# Patient Record
Sex: Female | Born: 1965 | Race: White | Hispanic: No | Smoking: Current every day smoker
Health system: Southern US, Community
[De-identification: ages and names within clinical notes are randomized; demographics above are authoritative.]

---

## 2004-12-14 ENCOUNTER — Ambulatory Visit: Payer: Self-pay | Admitting: Pain Medicine

## 2004-12-25 ENCOUNTER — Ambulatory Visit: Payer: Self-pay | Admitting: Unknown Physician Specialty

## 2004-12-29 ENCOUNTER — Ambulatory Visit: Payer: Self-pay | Admitting: Unknown Physician Specialty

## 2004-12-31 ENCOUNTER — Ambulatory Visit: Payer: Self-pay | Admitting: Pain Medicine

## 2005-01-13 ENCOUNTER — Ambulatory Visit: Payer: Self-pay | Admitting: Physician Assistant

## 2005-01-18 ENCOUNTER — Ambulatory Visit: Payer: Self-pay | Admitting: Surgery

## 2005-01-26 ENCOUNTER — Ambulatory Visit: Payer: Self-pay | Admitting: Pain Medicine

## 2005-02-09 ENCOUNTER — Ambulatory Visit: Payer: Self-pay | Admitting: Pain Medicine

## 2005-02-26 ENCOUNTER — Ambulatory Visit: Payer: Self-pay | Admitting: Pain Medicine

## 2005-03-09 ENCOUNTER — Ambulatory Visit: Payer: Self-pay | Admitting: Unknown Physician Specialty

## 2005-03-16 ENCOUNTER — Ambulatory Visit: Payer: Self-pay | Admitting: Pain Medicine

## 2005-03-19 ENCOUNTER — Ambulatory Visit: Payer: Self-pay | Admitting: Unknown Physician Specialty

## 2005-04-15 ENCOUNTER — Ambulatory Visit: Payer: Self-pay | Admitting: Physician Assistant

## 2005-05-11 ENCOUNTER — Ambulatory Visit: Payer: Self-pay | Admitting: Pain Medicine

## 2005-06-10 ENCOUNTER — Ambulatory Visit: Payer: Self-pay | Admitting: Physician Assistant

## 2005-07-29 ENCOUNTER — Emergency Department: Payer: Self-pay | Admitting: Emergency Medicine

## 2005-11-09 ENCOUNTER — Ambulatory Visit: Payer: Self-pay | Admitting: Gastroenterology

## 2006-05-02 ENCOUNTER — Ambulatory Visit: Payer: Self-pay | Admitting: Pain Medicine

## 2006-05-17 ENCOUNTER — Ambulatory Visit: Payer: Self-pay | Admitting: Pain Medicine

## 2006-05-30 ENCOUNTER — Ambulatory Visit: Payer: Self-pay | Admitting: Physician Assistant

## 2006-06-16 ENCOUNTER — Ambulatory Visit: Payer: Self-pay | Admitting: Physician Assistant

## 2006-07-14 ENCOUNTER — Ambulatory Visit: Payer: Self-pay | Admitting: Physician Assistant

## 2006-08-12 ENCOUNTER — Ambulatory Visit: Payer: Self-pay | Admitting: Physician Assistant

## 2006-11-03 ENCOUNTER — Ambulatory Visit: Payer: Self-pay | Admitting: Physician Assistant

## 2006-11-10 ENCOUNTER — Ambulatory Visit: Payer: Self-pay | Admitting: Pain Medicine

## 2006-11-22 ENCOUNTER — Ambulatory Visit: Payer: Self-pay | Admitting: Physician Assistant

## 2006-11-30 ENCOUNTER — Ambulatory Visit: Payer: Self-pay | Admitting: Physician Assistant

## 2006-12-26 ENCOUNTER — Other Ambulatory Visit: Payer: Self-pay

## 2006-12-26 ENCOUNTER — Emergency Department: Payer: Self-pay | Admitting: Emergency Medicine

## 2006-12-28 ENCOUNTER — Ambulatory Visit: Payer: Self-pay | Admitting: Physician Assistant

## 2007-02-14 ENCOUNTER — Ambulatory Visit: Payer: Self-pay | Admitting: Physician Assistant

## 2007-03-01 ENCOUNTER — Ambulatory Visit: Payer: Self-pay | Admitting: Pain Medicine

## 2007-03-02 ENCOUNTER — Ambulatory Visit: Payer: Self-pay | Admitting: Pain Medicine

## 2007-03-14 ENCOUNTER — Ambulatory Visit: Payer: Self-pay | Admitting: Physician Assistant

## 2007-05-10 ENCOUNTER — Ambulatory Visit: Payer: Self-pay | Admitting: Physician Assistant

## 2007-06-05 ENCOUNTER — Ambulatory Visit: Payer: Self-pay | Admitting: Physician Assistant

## 2007-06-16 ENCOUNTER — Ambulatory Visit: Payer: Self-pay | Admitting: Physician Assistant

## 2007-06-26 ENCOUNTER — Ambulatory Visit: Payer: Self-pay | Admitting: Physician Assistant

## 2007-07-06 ENCOUNTER — Ambulatory Visit: Payer: Self-pay | Admitting: Physician Assistant

## 2007-07-18 ENCOUNTER — Ambulatory Visit: Payer: Self-pay | Admitting: Pain Medicine

## 2007-08-09 ENCOUNTER — Ambulatory Visit: Payer: Self-pay | Admitting: Physician Assistant

## 2007-09-20 ENCOUNTER — Ambulatory Visit: Payer: Self-pay | Admitting: Physician Assistant

## 2007-10-09 ENCOUNTER — Ambulatory Visit: Payer: Self-pay | Admitting: Physician Assistant

## 2007-10-12 ENCOUNTER — Encounter: Payer: Self-pay | Admitting: Pain Medicine

## 2007-10-28 ENCOUNTER — Encounter: Payer: Self-pay | Admitting: Pain Medicine

## 2007-11-28 ENCOUNTER — Encounter: Payer: Self-pay | Admitting: Pain Medicine

## 2007-12-06 ENCOUNTER — Ambulatory Visit: Payer: Self-pay | Admitting: Physician Assistant

## 2007-12-28 ENCOUNTER — Ambulatory Visit: Payer: Self-pay | Admitting: Internal Medicine

## 2008-01-08 ENCOUNTER — Ambulatory Visit: Payer: Self-pay | Admitting: Physician Assistant

## 2008-01-28 ENCOUNTER — Ambulatory Visit: Payer: Self-pay | Admitting: Internal Medicine

## 2008-02-02 ENCOUNTER — Ambulatory Visit: Payer: Self-pay | Admitting: Unknown Physician Specialty

## 2008-02-14 ENCOUNTER — Ambulatory Visit: Payer: Self-pay | Admitting: Internal Medicine

## 2008-02-27 ENCOUNTER — Ambulatory Visit: Payer: Self-pay | Admitting: Internal Medicine

## 2008-03-07 ENCOUNTER — Ambulatory Visit: Payer: Self-pay | Admitting: Physician Assistant

## 2008-03-29 ENCOUNTER — Ambulatory Visit: Payer: Self-pay | Admitting: Internal Medicine

## 2008-04-25 ENCOUNTER — Ambulatory Visit: Payer: Self-pay | Admitting: Physician Assistant

## 2008-04-29 ENCOUNTER — Ambulatory Visit: Payer: Self-pay | Admitting: Internal Medicine

## 2008-05-16 ENCOUNTER — Ambulatory Visit: Payer: Self-pay | Admitting: Internal Medicine

## 2008-05-21 ENCOUNTER — Ambulatory Visit: Payer: Self-pay | Admitting: Pain Medicine

## 2008-05-27 ENCOUNTER — Ambulatory Visit: Payer: Self-pay | Admitting: Internal Medicine

## 2008-05-28 ENCOUNTER — Ambulatory Visit: Payer: Self-pay | Admitting: Pain Medicine

## 2008-06-17 ENCOUNTER — Ambulatory Visit: Payer: Self-pay | Admitting: Pain Medicine

## 2008-06-27 ENCOUNTER — Ambulatory Visit: Payer: Self-pay | Admitting: Internal Medicine

## 2008-07-23 ENCOUNTER — Ambulatory Visit: Payer: Self-pay | Admitting: Physician Assistant

## 2008-07-27 ENCOUNTER — Ambulatory Visit: Payer: Self-pay | Admitting: Internal Medicine

## 2008-11-05 ENCOUNTER — Ambulatory Visit: Payer: Self-pay | Admitting: Physician Assistant

## 2009-01-14 ENCOUNTER — Emergency Department: Payer: Self-pay | Admitting: Emergency Medicine

## 2009-02-04 ENCOUNTER — Ambulatory Visit: Payer: Self-pay | Admitting: Physician Assistant

## 2009-04-22 ENCOUNTER — Ambulatory Visit: Payer: Self-pay | Admitting: Gastroenterology

## 2009-05-05 ENCOUNTER — Ambulatory Visit: Payer: Self-pay | Admitting: Pain Medicine

## 2009-05-27 ENCOUNTER — Ambulatory Visit: Payer: Self-pay | Admitting: Internal Medicine

## 2009-06-04 ENCOUNTER — Ambulatory Visit: Payer: Self-pay | Admitting: Internal Medicine

## 2009-06-27 ENCOUNTER — Ambulatory Visit: Payer: Self-pay | Admitting: Internal Medicine

## 2009-07-02 ENCOUNTER — Ambulatory Visit: Payer: Self-pay | Admitting: Pain Medicine

## 2009-07-27 ENCOUNTER — Ambulatory Visit: Payer: Self-pay | Admitting: Internal Medicine

## 2010-04-16 ENCOUNTER — Ambulatory Visit: Payer: Self-pay | Admitting: Allergy

## 2010-05-21 ENCOUNTER — Emergency Department: Payer: Self-pay | Admitting: Emergency Medicine

## 2011-02-22 ENCOUNTER — Ambulatory Visit: Payer: Self-pay | Admitting: Pain Medicine

## 2011-02-25 ENCOUNTER — Ambulatory Visit: Payer: Self-pay | Admitting: Pain Medicine

## 2011-03-08 ENCOUNTER — Ambulatory Visit: Payer: Self-pay | Admitting: Pain Medicine

## 2011-03-11 ENCOUNTER — Ambulatory Visit: Payer: Self-pay | Admitting: Pain Medicine

## 2011-04-05 ENCOUNTER — Ambulatory Visit: Payer: Self-pay | Admitting: Pain Medicine

## 2011-04-08 ENCOUNTER — Ambulatory Visit: Payer: Self-pay | Admitting: Pain Medicine

## 2011-04-26 ENCOUNTER — Ambulatory Visit: Payer: Self-pay | Admitting: Pain Medicine

## 2011-07-07 ENCOUNTER — Ambulatory Visit: Payer: Self-pay | Admitting: Pain Medicine

## 2011-07-21 ENCOUNTER — Ambulatory Visit: Payer: Self-pay | Admitting: Pain Medicine

## 2011-08-31 ENCOUNTER — Ambulatory Visit: Payer: Self-pay | Admitting: Pain Medicine

## 2011-12-27 ENCOUNTER — Ambulatory Visit: Payer: Self-pay | Admitting: Pain Medicine

## 2012-01-29 ENCOUNTER — Emergency Department: Payer: Self-pay | Admitting: Emergency Medicine

## 2012-03-05 LAB — CBC
HCT: 47.1 % — ABNORMAL HIGH (ref 35.0–47.0)
HGB: 15.7 g/dL (ref 12.0–16.0)
MCV: 86 fL (ref 80–100)
Platelet: 429 10*3/uL (ref 150–440)
RBC: 5.45 10*6/uL — ABNORMAL HIGH (ref 3.80–5.20)
RDW: 15.7 % — ABNORMAL HIGH (ref 11.5–14.5)
WBC: 15.1 10*3/uL — ABNORMAL HIGH (ref 3.6–11.0)

## 2012-03-05 LAB — COMPREHENSIVE METABOLIC PANEL
Albumin: 4.7 g/dL (ref 3.4–5.0)
Anion Gap: 13 (ref 7–16)
BUN: 14 mg/dL (ref 7–18)
Bilirubin,Total: 0.6 mg/dL (ref 0.2–1.0)
Calcium, Total: 9.3 mg/dL (ref 8.5–10.1)
Co2: 19 mmol/L — ABNORMAL LOW (ref 21–32)
Creatinine: 0.71 mg/dL (ref 0.60–1.30)
EGFR (African American): >60
EGFR (Non-African Amer.): >60
Osmolality: 278 (ref 275–301)
Potassium: 3.6 mmol/L (ref 3.5–5.1)
Sodium: 138 mmol/L (ref 136–145)

## 2012-03-05 LAB — URINALYSIS, COMPLETE
Bacteria: NONE SEEN
Bilirubin,UR: NEGATIVE
Ph: 5 (ref 4.5–8.0)
RBC,UR: 27 /HPF (ref 0–5)
Squamous Epithelial: 6

## 2012-03-05 LAB — LIPASE, BLOOD: Lipase: 69 U/L — ABNORMAL LOW (ref 73–393)

## 2012-03-05 LAB — PREGNANCY, URINE: Pregnancy Test, Urine: NEGATIVE m[IU]/mL

## 2012-03-06 ENCOUNTER — Inpatient Hospital Stay: Payer: Self-pay | Admitting: Surgery

## 2012-03-06 LAB — CBC WITH DIFFERENTIAL/PLATELET
Basophil %: 0.9 %
Eosinophil #: 0 10*3/uL (ref 0.0–0.7)
Eosinophil %: 0.1 %
Lymphocyte %: 7.8 %
MCH: 28.1 pg (ref 26.0–34.0)
MCHC: 32.4 g/dL (ref 32.0–36.0)
MCV: 87 fL (ref 80–100)
Monocyte #: 1.1 x10 3/mm — ABNORMAL HIGH (ref 0.2–0.9)
Platelet: 385 10*3/uL (ref 150–440)
RBC: 5.1 10*6/uL (ref 3.80–5.20)

## 2012-03-06 LAB — PROTIME-INR: INR: 1

## 2012-03-06 LAB — BASIC METABOLIC PANEL
Anion Gap: 10 (ref 7–16)
BUN: 11 mg/dL (ref 7–18)
Calcium, Total: 8.7 mg/dL (ref 8.5–10.1)
Chloride: 103 mmol/L (ref 98–107)
Co2: 24 mmol/L (ref 21–32)
Creatinine: 0.55 mg/dL — ABNORMAL LOW (ref 0.60–1.30)
EGFR (African American): >60
Osmolality: 277 (ref 275–301)
Potassium: 3.9 mmol/L (ref 3.5–5.1)

## 2012-03-07 LAB — BASIC METABOLIC PANEL
Anion Gap: 7 (ref 7–16)
BUN: 13 mg/dL (ref 7–18)
Chloride: 110 mmol/L — ABNORMAL HIGH (ref 98–107)
Creatinine: 0.98 mg/dL (ref 0.60–1.30)
EGFR (African American): >60
EGFR (Non-African Amer.): >60
Osmolality: 283 (ref 275–301)
Potassium: 4.5 mmol/L (ref 3.5–5.1)
Sodium: 140 mmol/L (ref 136–145)

## 2012-03-07 LAB — CBC WITH DIFFERENTIAL/PLATELET
Basophil #: 0 10*3/uL (ref 0.0–0.1)
Eosinophil %: 0.4 %
HCT: 46 % (ref 35.0–47.0)
HGB: 15.6 g/dL (ref 12.0–16.0)
Lymphocyte #: 0.6 10*3/uL — ABNORMAL LOW (ref 1.0–3.6)
Lymphocyte %: 7.8 %
MCH: 29.8 pg (ref 26.0–34.0)
MCHC: 33.8 g/dL (ref 32.0–36.0)
Monocyte #: 0.7 x10 3/mm (ref 0.2–0.9)
Neutrophil #: 6.7 10*3/uL — ABNORMAL HIGH (ref 1.4–6.5)
Neutrophil %: 83.5 %
RDW: 15.6 % — ABNORMAL HIGH (ref 11.5–14.5)

## 2012-03-11 LAB — CBC WITH DIFFERENTIAL/PLATELET
Basophil #: 0 10*3/uL (ref 0.0–0.1)
Eosinophil %: 0.9 %
HCT: 33 % — ABNORMAL LOW (ref 35.0–47.0)
HGB: 11.2 g/dL — ABNORMAL LOW (ref 12.0–16.0)
Lymphocyte %: 17.5 %
Monocyte %: 13.4 %
Neutrophil #: 4.3 10*3/uL (ref 1.4–6.5)
Neutrophil %: 67.8 %
Platelet: 201 10*3/uL (ref 150–440)
RDW: 15.9 % — ABNORMAL HIGH (ref 11.5–14.5)
WBC: 6.4 10*3/uL (ref 3.6–11.0)

## 2012-03-11 LAB — BASIC METABOLIC PANEL
Calcium, Total: 8.1 mg/dL — ABNORMAL LOW (ref 8.5–10.1)
Chloride: 106 mmol/L (ref 98–107)
Co2: 25 mmol/L (ref 21–32)
Creatinine: 0.53 mg/dL — ABNORMAL LOW (ref 0.60–1.30)
EGFR (Non-African Amer.): >60
Osmolality: 272 (ref 275–301)
Potassium: 3.6 mmol/L (ref 3.5–5.1)
Sodium: 138 mmol/L (ref 136–145)

## 2012-03-13 LAB — CLOSTRIDIUM DIFFICILE BY PCR

## 2012-03-15 LAB — CBC WITH DIFFERENTIAL/PLATELET
Basophil %: 0.1 %
Eosinophil #: 0.1 10*3/uL (ref 0.0–0.7)
Eosinophil %: 0.3 %
HGB: 11.1 g/dL — ABNORMAL LOW (ref 12.0–16.0)
Lymphocyte %: 8.2 %
MCH: 29.9 pg (ref 26.0–34.0)
Neutrophil #: 16.1 10*3/uL — ABNORMAL HIGH (ref 1.4–6.5)
Neutrophil %: 83.9 %
Platelet: 318 10*3/uL (ref 150–440)
RBC: 3.71 10*6/uL — ABNORMAL LOW (ref 3.80–5.20)
WBC: 19.2 10*3/uL — ABNORMAL HIGH (ref 3.6–11.0)

## 2012-03-15 LAB — CREATININE, SERUM
Creatinine: 1.83 mg/dL — ABNORMAL HIGH (ref 0.60–1.30)
EGFR (African American): 38 — ABNORMAL LOW
EGFR (Non-African Amer.): 33 — ABNORMAL LOW

## 2012-05-15 ENCOUNTER — Ambulatory Visit: Payer: Self-pay | Admitting: Surgery

## 2014-07-16 NOTE — H&P (Signed)
Subjective/Chief Complaint abd pain    History of Present Illness 48 hrs worsening abd pain. Mult prior episodes resolved after 24 hrs without hospitalization no f/c min flatus, nml BM earlier today single emesis this am nauseated    Past History IBS, anxiety depression. tob abuse, sleep apnea has CPAP but doesn't use it PSH bladder, gyn with bowel injury, peritonitis emergency bowel resection    Past Medical Health Smoking   Past Med/Surgical Hx:  Sleep Apnea  with CPAP:   Anxiety:   Bulging disc in neck:   GERD - Esophageal Reflux:   Arthritis:   Anemia:   Hypercholesterolemia:   fibroid ablation:   s/p EGD: noted reflux dz and barrets esophagus, but no change from prior exam  Cholecystectomy:   ALLERGIES:  Penicillin: Hives, Rash  Cipro: Itching  HOME MEDICATIONS: Medication Instructions Status  meloxicam 15 mg oral tablet 1 tab(s) orally once a day Active  Norco 325 mg-5 mg tablet 1 tab(s) orally 1 to 4 times a day, As Needed Active  Nexium delayed release capsule 40 mg 1 cap(s) orally 2 times a day x 30 days  Active  zonisamide 100 mg oral capsule 2 cap(s) orally once a day Active  amitriptyline 25 mg oral tablet 1 tab(s) orally once a day (at bedtime) Active  atorvastatin 40 mg oral tablet 1 tab(s) orally once a day (at bedtime) Active  clonazepam 2 mg oral tablet 1 tab(s) orally 4 times a day Active  lamotrigine 200 mg oral tablet, extended release 1 tab(s) orally once a day Active  Abilify 10 mg oral tablet 1 tab(s) orally once a day Active   Family and Social History:   Family History Non-Contributory    Social History positive  tobacco, negative ETOH, 2 ppd    + Tobacco Current (within 1 year)    Place of Living Home   Review of Systems:   Fever/Chills No    Cough No    Abdominal Pain Yes    Diarrhea No    Constipation No    Nausea/Vomiting Yes    SOB/DOE No    Chest Pain No    Dysuria No    Tolerating Diet No  Nauseated  Vomiting    Physical Exam:   GEN uncomfortable    HEENT pink conjunctivae    NECK supple    RESP normal resp effort  clear BS  no use of accessory muscles    CARD regular rate    ABD denies tenderness  soft  distended  nontender, no guarding no rebound. tympanitic    LYMPH negative neck    EXTR negative edema    SKIN normal to palpation    PSYCH alert, A+O to time, place, person, good insight   Lab Results: Hepatic:  08-Dec-13 18:34    Bilirubin, Total 0.6   Alkaline Phosphatase  146   SGPT (ALT) 20   SGOT (AST) 19   Total Protein, Serum 8.1   Albumin, Serum 4.7  Routine Chem:  08-Dec-13 18:34    Glucose, Serum  131   BUN 14   Creatinine (comp) 0.71   Sodium, Serum 138   Potassium, Serum 3.6   Chloride, Serum 106   CO2, Serum  19   Calcium (Total), Serum 9.3   Osmolality (calc) 278   eGFR (African American) >60   eGFR (Non-African American) >60 (eGFR values <29m/min/1.73 m2 may be an indication of chronic kidney disease (CKD). Calculated eGFR is useful in patients with  stable renal function. The eGFR calculation will not be reliable in acutely ill patients when serum creatinine is changing rapidly. It is not useful in  patients on dialysis. The eGFR calculation may not be applicable to patients at the low and high extremes of body sizes, pregnant women, and vegetarians.)   Anion Gap 13   Lipase  69 (Result(s) reported on 05 Mar 2012 at 07:00PM.)  Routine UA:  08-Dec-13 18:34    Color (UA) Amber   Clarity (UA) Cloudy   Glucose (UA) Negative   Bilirubin (UA) Negative   Ketones (UA) 2+   Specific Gravity (UA) 1.032   Blood (UA) 2+   pH (UA) 5.0   Protein (UA) >=500   Nitrite (UA) Negative   Leukocyte Esterase (UA) Negative (Result(s) reported on 05 Mar 2012 at 06:59PM.)   RBC (UA) 27 /HPF   WBC (UA) 3 /HPF   Bacteria (UA) NONE SEEN   Epithelial Cells (UA) 6 /HPF   Mucous (UA) PRESENT   Hyaline Cast (UA) 7 /LPF (Result(s) reported on 05 Mar 2012 at  06:59PM.)  Routine Sero:  08-Dec-13 06:34    Pregnancy Test, Urine NEGATIVE (The results of the qualitative urine HCG (Pregnancy Test) should be evaluated in light of other clinical information.  There are limitations to the test which, in certain clinical situations, may result in a false positive or negative result. Thehigh dose hook effect can occur in urine samples with extremely high HCG concentrations.  This effect can produce a negative result in certain situations. It is suggested that results of the qualitative HCG be confirmed by an alternate methodology, such as the quantitative serum beta HCG test.)  Routine Hem:  08-Dec-13 18:34    WBC (CBC)  15.1   RBC (CBC)  5.45   Hemoglobin (CBC) 15.7   Hematocrit (CBC)  47.1   Platelet Count (CBC) 429 (Result(s) reported on 05 Mar 2012 at 06:49PM.)   MCV 86   MCH 28.8   MCHC 33.3   RDW  15.7     Assessment/Admission Diagnosis CT rev'd pSBO, admit hydrate, NG tube   Electronic Signatures: Florene Glen (MD)  (Signed 09-Dec-13 00:23)  Authored: CHIEF COMPLAINT and HISTORY, PAST MEDICAL/SURGIAL HISTORY, ALLERGIES, HOME MEDICATIONS, FAMILY AND SOCIAL HISTORY, REVIEW OF SYSTEMS, PHYSICAL EXAM, LABS, ASSESSMENT AND PLAN   Last Updated: 09-Dec-13 00:23 by Florene Glen (MD)

## 2014-07-16 NOTE — H&P (Signed)
PATIENT NAME:  Brandi Leonard, Brandi Leonard MR#:  [id number] DATE OF BIRTH:  06-Mar-1966  DATE OF ADMISSION:  03/06/2012  CHIEF COMPLAINT: Abdominal pain.   HISTORY OF PRESENT ILLNESS: This is a patient with 48 hours of abdominal pain. She points to the middle of her abdomen. She states that it started about 48 hours ago and has been gradually worsening. She's had multiple episodes of this in the past, most recently several weeks ago which she states resolved after 24 hours or so, always resolving without necessitating a trip to the Emergency Room. She has never been admitted for this. Has never had a nasogastric tube for it.   She denies fevers or chills, is nauseated, had a single emesis this morning. Has been passing gas, although minimal, and had a bowel movement before coming to the Emergency Room today. She denies melena or hematochezia.   PAST MEDICAL HISTORY:  1. Anxiety.  2. Depression.  3. Reflux disease.   PAST SURGICAL HISTORY: GYN surgery and bladder surgery which resulted in a small bowel injury necessitating emergency surgery and small bowel resection.   ALLERGIES: Cipro and penicillin.   MEDICATIONS: Multiple, see chart.   FAMILY HISTORY: Noncontributory.   SOCIAL HISTORY: The patient smokes 2 packs of cigarettes per day. She does not drink alcohol.   REVIEW OF SYSTEMS: 10 system review was performed and negative with the exception of that mentioned in the history of present illness.   ADDITIONAL HISTORY: She has sleep apnea but does not utilize her CPAP machine which she has at home and rarely if ever uses it.   PHYSICAL EXAMINATION:   GENERAL: Healthy, uncomfortable-appearing Caucasian female patient, 62 inches tall, 120 pounds, BMI 22.   VITAL SIGNS: She is afebrile with a temperature of 96, pulse 93, respirations 18, blood pressure 172/96, 96% room air sat.   HEENT: No scleral icterus.   NECK: No palpable neck nodes.   CHEST: Clear to auscultation.   CARDIAC: Regular  rate and rhythm.   ABDOMEN: Soft distended, slightly tympanitic. Long midline scar is noted from the periumbilical area down to the pubis. No hernias are noted. There is essentially no tenderness. No percussion tenderness. No rebound tenderness. No guarding or rebound.   EXTREMITIES: Without edema. Calves are nontender.   NEUROLOGIC: Grossly intact.   INTEGUMENTARY: No jaundice.   LABORATORY, DIAGNOSTIC, AND RADIOLOGICAL DATA: CT scan is personally reviewed. Obvious bowel obstruction present with dilated small bowel and stomach. No free air. There are air-fluid levels.   Urinalysis shows 2+ blood, 27 RBCs per high-power field with negative leukocyte esterase. Lipase 69. Electrolytes show a CO2 of 19 and a glucose of 131, otherwise normal labs. White blood cell count 15.1, hemoglobin and hematocrit 15.7 and 47.1, platelet count of 429. Pregnancy test is negative.   ASSESSMENT AND PLAN: This patient has had multiple episodes of abdominal pain with nausea and vomiting suggestive of a bowel obstruction which have resolved over 24 hours. She has never required hospitalization for it. This time, however, it has lasted 48 hours. She sought medical care because it did not resolve but it is nearly identical to her prior episodes. She is passing gas and did have a bowel movement today prior to coming to the Emergency Room. Has only had a single emesis but her CT scan and physical exam suggests partial small bowel obstruction. I have recommended admission to the hospital, hydration, and a nasogastric tube for decompression of the dilated stomach and small bowel, re-examination with physical  exam and serial KUBs will be ordered. This was discussed with she and her husband. The rationale for this has been discussed. They understood and agreed to proceed. I also discussed this with the Emergency Room physician.   ____________________________ Jerrol Banana. Burt Knack, MD rec:drc D: 03/06/2012 00:29:12 ET T: 03/06/2012  07:23:39 ET JOB#: [id number]  cc: Jerrol Banana. Burt Knack, MD, <Dictator> Florene Glen MD ELECTRONICALLY SIGNED 03/07/2012 8:40

## 2014-07-16 NOTE — Op Note (Signed)
PATIENT NAME:  Brandi Leonard, Brandi Leonard MR#:  [id number] DATE OF BIRTH:  Aug 15, 1965  DATE OF PROCEDURE:  03/06/2012  PREOPERATIVE DIAGNOSIS: Adhesive small bowel obstruction.   POSTOPERATIVE DIAGNOSIS: Adhesive small bowel obstruction with threatened bowel, two strictures, and small bowel volvulus.   PROCEDURES PERFORMED:  1. Exploratory laparotomy.  2. Enterolysis.  3. Iliectomy x 2 with enteroenterostomies.  4. Incidental appendectomy.   SURGEON: Consuela Mimes, MD   ANESTHESIA: Adhesive small bowel obstruction.   PROCEDURE IN DETAIL: The patient was placed supine on the Operating Room table and prepped and draped in the usual sterile fashion. An incision was made to the previous midline incision but not carried all the way to the pubis as the scar went. Rather, the incision was extended up the midline toward the xiphoid process slightly. This was carried down through the subcutaneous tissue and the linea alba with electrocautery, and the peritoneum was entered carefully. There were adhesions to the omentum and transverse mesocolon to the anterior abdominal wall and these were taken down with the electrocautery carefully and then the transverse colon was delivered, and the patient had marked dilation and gas within the transverse colon as well as within the cecum and sigmoid colon. There were significant adhesions from the sigmoid colon to the left lateral sidewall, and these were taken down with the electrocautery so that the sigmoid colon and rectosigmoid could be straighter. There was an obvious small bowel obstruction with very dilated loops of small intestine, and these all seemed to be concentrated towards an adhesion that was in a stellate formation overlapping two loops at least of small intestine going between the transverse colon, omentum and retroperitoneum in the right midabdomen just above the true pelvis. First this adhesive band was taken down, and then there were multiple adhesions  within this stellate formation that were taken down with the electrocautery. It seemed that the patient reperitonealized areas of adhesions almost making some of her small bowel retroperitoneal. Once all of these were taken down and it was apparent that the very firm thick and tight adhesive band that I had divided had gone across two loops of small intestine and that there were other loops of intestine that were threatened by looking cyanotic prior to taking down these adhesions. Once the adhesions were taken down, the cyanosis completely resolved. Also, it appeared that the vast majority of the small intestine was volvulized around this adhesion but it was not a true closed loop. Once all of the adhesions were taken down, it was apparent that there were two very tight strictures that appeared quite chronic in nature. One was in the terminal ileum approximately 15 cm proximal to the ileocecal valve, and this was in the area of previous small bowel repairs as there were areas of silk suture on the antimesenteric surface of the small intestine just proximal to this. The other area of stricture was even tighter that was in the proximal or mid ileum, being quite a bit proximal to the terminal ileal stricture. Each of these strictures was then resected taking out a portion of the banded and strictured small intestinal mesentery as well utilizing the Harmonic scalpel on the mesentery and then a GIA stapling device on each portion of intestine. The separate specimens were handed off separately and the anastomoses were performed separately, but in each case an enteroenterostomy was performed on the antimesenteric surface of each with a firing of the GIA stapling device and then closure of the resultant enterotomy with  a TA-60 stapler. In each case, a 3-0 silk seromuscular Lembert suture was placed at the apex of the GIA staple line and the mesenteric defect was closed with a running 3-0 Vicryl. The appendicular artery was  ligated with the Harmonic scalpel and the mesoappendix was taken down with the electrocautery and the appendectomy was also performed with the GIA stapling device just at the base of the appendix where it met the cecum. This was performed because the appendix was extremely long and the patient had very redundant mesentery, and initially I was concerned as while I was taking down adhesions that the patient had Ladd bands, and I did in fact lyse many adhesions from the terminal ileum that went across the ascending colon and over to the duodenum (which was also distended). The patient had a normal ligament of Treitz, however, and therefore did not have a true nonrotation of the intestine. Despite this, the cecum was very mobile, however. Once all of the adhesions had been taken down and the two areas of enterectomy had been performed and the anastomoses had been performed, I replaced the small intestine in its anatomic position and then placed two small sheets of Seprafilm over the intestine and then draped the very redundant transverse colon over top of that with the small amount of omentum that was available. I then closed the linea alba with a running #1 PDS suture, irrigated the subcutaneous tissue, and closed the skin with a skin stapling device. A sterile dressing was applied completing the procedure. The patient tolerated the procedure well, and there were no complications.  ____________________________ Consuela Mimes, MD wfm:cbb D: 03/06/2012 17:29:18 ET T: 03/06/2012 17:48:10 ET JOB#: [id number] Consuela Mimes MD ELECTRONICALLY SIGNED 03/08/2012 7:56

## 2014-07-19 NOTE — Discharge Summary (Signed)
PATIENT NAME:  Brandi Leonard, PAE MR#:  [id number] DATE OF BIRTH:  05/13/1965  DATE OF ADMISSION:  03/06/2012 DATE OF DISCHARGE:  03/15/2012  PRINCIPLE DIAGNOSIS: Adhesive small bowel obstruction with two strictures and small bowel volvulus.   OTHER DIAGNOSES:  1. Anxiety/depression.  2. History of GYN surgery and bladder surgery which resulted in small bowel injury necessitating emergency surgery and small bowel resection.   PRINCIPLE PROCEDURE PERFORMED DURING THIS ADMISSION: On 03/06/2012, exploratory laparotomy, extensive enterolysis, ileectomy x 2 with enteroenterostomies and incidental appendectomy.   HOSPITAL COURSE: [name] was admitted to the hospital and had nasogastric suction and underwent the above-mentioned procedure for the above-mentioned diagnosis. Ultimately, she had her nasogastric tube removed and her diet was slowly advanced and she when was tolerating a diet she was discharged home. She was asked to make an appointment to see me in the office and to call in the interim for any problems.  ____________________________ Consuela Mimes, MD wfm:sb D: 03/24/2012 06:18:53 ET T: 03/24/2012 11:30:11 ET JOB#: [id number]  cc: Consuela Mimes, MD, <Dictator> Consuela Mimes MD ELECTRONICALLY SIGNED 04/02/2012 20:29

## 2016-09-06 DIAGNOSIS — D72829 Elevated white blood cell count, unspecified: Secondary | ICD-10-CM | POA: Diagnosis not present

## 2016-09-06 DIAGNOSIS — K565 Intestinal adhesions [bands], unspecified as to partial versus complete obstruction: Secondary | ICD-10-CM | POA: Diagnosis not present

## 2016-09-06 DIAGNOSIS — K529 Noninfective gastroenteritis and colitis, unspecified: Secondary | ICD-10-CM | POA: Diagnosis not present

## 2016-09-06 DIAGNOSIS — R1084 Generalized abdominal pain: Secondary | ICD-10-CM | POA: Diagnosis not present

## 2016-09-06 DIAGNOSIS — R109 Unspecified abdominal pain: Secondary | ICD-10-CM | POA: Diagnosis not present

## 2016-09-07 DIAGNOSIS — K565 Intestinal adhesions [bands], unspecified as to partial versus complete obstruction: Secondary | ICD-10-CM | POA: Diagnosis not present

## 2016-09-07 DIAGNOSIS — R109 Unspecified abdominal pain: Secondary | ICD-10-CM | POA: Diagnosis not present

## 2016-09-07 DIAGNOSIS — R1084 Generalized abdominal pain: Secondary | ICD-10-CM | POA: Diagnosis not present

## 2016-09-07 DIAGNOSIS — K529 Noninfective gastroenteritis and colitis, unspecified: Secondary | ICD-10-CM | POA: Diagnosis not present

## 2016-09-08 DIAGNOSIS — K631 Perforation of intestine (nontraumatic): Secondary | ICD-10-CM | POA: Diagnosis not present

## 2016-09-08 DIAGNOSIS — E2740 Unspecified adrenocortical insufficiency: Secondary | ICD-10-CM | POA: Diagnosis not present

## 2016-09-08 DIAGNOSIS — A419 Sepsis, unspecified organism: Secondary | ICD-10-CM

## 2016-09-08 DIAGNOSIS — J80 Acute respiratory distress syndrome: Secondary | ICD-10-CM | POA: Diagnosis not present

## 2016-09-09 DIAGNOSIS — A419 Sepsis, unspecified organism: Secondary | ICD-10-CM | POA: Diagnosis not present

## 2016-09-09 DIAGNOSIS — K631 Perforation of intestine (nontraumatic): Secondary | ICD-10-CM | POA: Diagnosis not present

## 2016-09-09 DIAGNOSIS — E2740 Unspecified adrenocortical insufficiency: Secondary | ICD-10-CM | POA: Diagnosis not present

## 2016-09-10 ENCOUNTER — Inpatient Hospital Stay (HOSPITAL_COMMUNITY): Payer: BLUE CROSS/BLUE SHIELD

## 2016-09-10 ENCOUNTER — Inpatient Hospital Stay (HOSPITAL_COMMUNITY)
Admission: AD | Admit: 2016-09-10 | Discharge: 2016-10-01 | DRG: 870 | Disposition: A | Discharge status: Home Health | Payer: BLUE CROSS/BLUE SHIELD | Source: Other Acute Inpatient Hospital | Attending: Internal Medicine | Admitting: Internal Medicine

## 2016-09-10 ENCOUNTER — Other Ambulatory Visit: Payer: Self-pay

## 2016-09-10 DIAGNOSIS — K56699 Other intestinal obstruction unspecified as to partial versus complete obstruction: Secondary | ICD-10-CM | POA: Diagnosis present

## 2016-09-10 DIAGNOSIS — R6520 Severe sepsis without septic shock: Secondary | ICD-10-CM | POA: Diagnosis present

## 2016-09-10 DIAGNOSIS — J690 Pneumonitis due to inhalation of food and vomit: Secondary | ICD-10-CM | POA: Diagnosis present

## 2016-09-10 DIAGNOSIS — R0682 Tachypnea, not elsewhere classified: Secondary | ICD-10-CM

## 2016-09-10 DIAGNOSIS — R188 Other ascites: Secondary | ICD-10-CM

## 2016-09-10 DIAGNOSIS — F419 Anxiety disorder, unspecified: Secondary | ICD-10-CM | POA: Diagnosis present

## 2016-09-10 DIAGNOSIS — I472 Ventricular tachycardia: Secondary | ICD-10-CM | POA: Diagnosis not present

## 2016-09-10 DIAGNOSIS — I5031 Acute diastolic (congestive) heart failure: Secondary | ICD-10-CM | POA: Diagnosis not present

## 2016-09-10 DIAGNOSIS — K9189 Other postprocedural complications and disorders of digestive system: Secondary | ICD-10-CM

## 2016-09-10 DIAGNOSIS — T4275XA Adverse effect of unspecified antiepileptic and sedative-hypnotic drugs, initial encounter: Secondary | ICD-10-CM | POA: Diagnosis present

## 2016-09-10 DIAGNOSIS — K659 Peritonitis, unspecified: Secondary | ICD-10-CM | POA: Diagnosis not present

## 2016-09-10 DIAGNOSIS — K631 Perforation of intestine (nontraumatic): Secondary | ICD-10-CM | POA: Diagnosis not present

## 2016-09-10 DIAGNOSIS — J441 Chronic obstructive pulmonary disease with (acute) exacerbation: Secondary | ICD-10-CM | POA: Diagnosis not present

## 2016-09-10 DIAGNOSIS — F17210 Nicotine dependence, cigarettes, uncomplicated: Secondary | ICD-10-CM | POA: Diagnosis not present

## 2016-09-10 DIAGNOSIS — K651 Peritoneal abscess: Secondary | ICD-10-CM | POA: Diagnosis not present

## 2016-09-10 DIAGNOSIS — I340 Nonrheumatic mitral (valve) insufficiency: Secondary | ICD-10-CM | POA: Diagnosis not present

## 2016-09-10 DIAGNOSIS — R140 Abdominal distension (gaseous): Secondary | ICD-10-CM | POA: Diagnosis not present

## 2016-09-10 DIAGNOSIS — Z978 Presence of other specified devices: Secondary | ICD-10-CM | POA: Diagnosis not present

## 2016-09-10 DIAGNOSIS — R509 Fever, unspecified: Secondary | ICD-10-CM | POA: Diagnosis not present

## 2016-09-10 DIAGNOSIS — R739 Hyperglycemia, unspecified: Secondary | ICD-10-CM | POA: Diagnosis not present

## 2016-09-10 DIAGNOSIS — D473 Essential (hemorrhagic) thrombocythemia: Secondary | ICD-10-CM | POA: Diagnosis not present

## 2016-09-10 DIAGNOSIS — E2740 Unspecified adrenocortical insufficiency: Secondary | ICD-10-CM | POA: Diagnosis present

## 2016-09-10 DIAGNOSIS — J9601 Acute respiratory failure with hypoxia: Secondary | ICD-10-CM | POA: Diagnosis not present

## 2016-09-10 DIAGNOSIS — G40409 Other generalized epilepsy and epileptic syndromes, not intractable, without status epilepticus: Secondary | ICD-10-CM | POA: Diagnosis not present

## 2016-09-10 DIAGNOSIS — R6521 Severe sepsis with septic shock: Secondary | ICD-10-CM | POA: Diagnosis present

## 2016-09-10 DIAGNOSIS — D6489 Other specified anemias: Secondary | ICD-10-CM | POA: Diagnosis present

## 2016-09-10 DIAGNOSIS — J95821 Acute postprocedural respiratory failure: Secondary | ICD-10-CM | POA: Diagnosis not present

## 2016-09-10 DIAGNOSIS — M549 Dorsalgia, unspecified: Secondary | ICD-10-CM | POA: Diagnosis present

## 2016-09-10 DIAGNOSIS — Z4659 Encounter for fitting and adjustment of other gastrointestinal appliance and device: Secondary | ICD-10-CM

## 2016-09-10 DIAGNOSIS — T17908A Unspecified foreign body in respiratory tract, part unspecified causing other injury, initial encounter: Secondary | ICD-10-CM

## 2016-09-10 DIAGNOSIS — Z681 Body mass index (BMI) 19 or less, adult: Secondary | ICD-10-CM | POA: Diagnosis not present

## 2016-09-10 DIAGNOSIS — T502X5A Adverse effect of carbonic-anhydrase inhibitors, benzothiadiazides and other diuretics, initial encounter: Secondary | ICD-10-CM | POA: Diagnosis not present

## 2016-09-10 DIAGNOSIS — E43 Unspecified severe protein-calorie malnutrition: Secondary | ICD-10-CM | POA: Diagnosis not present

## 2016-09-10 DIAGNOSIS — J449 Chronic obstructive pulmonary disease, unspecified: Secondary | ICD-10-CM | POA: Diagnosis present

## 2016-09-10 DIAGNOSIS — Z9889 Other specified postprocedural states: Secondary | ICD-10-CM | POA: Diagnosis not present

## 2016-09-10 DIAGNOSIS — J80 Acute respiratory distress syndrome: Secondary | ICD-10-CM | POA: Diagnosis present

## 2016-09-10 DIAGNOSIS — R7989 Other specified abnormal findings of blood chemistry: Secondary | ICD-10-CM

## 2016-09-10 DIAGNOSIS — R570 Cardiogenic shock: Secondary | ICD-10-CM | POA: Diagnosis not present

## 2016-09-10 DIAGNOSIS — R569 Unspecified convulsions: Secondary | ICD-10-CM | POA: Diagnosis not present

## 2016-09-10 DIAGNOSIS — K529 Noninfective gastroenteritis and colitis, unspecified: Secondary | ICD-10-CM | POA: Diagnosis present

## 2016-09-10 DIAGNOSIS — G9341 Metabolic encephalopathy: Secondary | ICD-10-CM | POA: Diagnosis present

## 2016-09-10 DIAGNOSIS — J431 Panlobular emphysema: Secondary | ICD-10-CM | POA: Diagnosis not present

## 2016-09-10 DIAGNOSIS — J810 Acute pulmonary edema: Secondary | ICD-10-CM | POA: Diagnosis not present

## 2016-09-10 DIAGNOSIS — E878 Other disorders of electrolyte and fluid balance, not elsewhere classified: Secondary | ICD-10-CM | POA: Diagnosis not present

## 2016-09-10 DIAGNOSIS — R109 Unspecified abdominal pain: Secondary | ICD-10-CM | POA: Diagnosis present

## 2016-09-10 DIAGNOSIS — E872 Acidosis: Secondary | ICD-10-CM | POA: Diagnosis present

## 2016-09-10 DIAGNOSIS — J9621 Acute and chronic respiratory failure with hypoxia: Secondary | ICD-10-CM | POA: Diagnosis not present

## 2016-09-10 DIAGNOSIS — J9600 Acute respiratory failure, unspecified whether with hypoxia or hypercapnia: Secondary | ICD-10-CM

## 2016-09-10 DIAGNOSIS — A419 Sepsis, unspecified organism: Secondary | ICD-10-CM | POA: Diagnosis not present

## 2016-09-10 DIAGNOSIS — R52 Pain, unspecified: Secondary | ICD-10-CM | POA: Diagnosis not present

## 2016-09-10 DIAGNOSIS — K56609 Unspecified intestinal obstruction, unspecified as to partial versus complete obstruction: Secondary | ICD-10-CM | POA: Diagnosis not present

## 2016-09-10 DIAGNOSIS — R197 Diarrhea, unspecified: Secondary | ICD-10-CM | POA: Diagnosis not present

## 2016-09-10 DIAGNOSIS — G9340 Encephalopathy, unspecified: Secondary | ICD-10-CM | POA: Diagnosis not present

## 2016-09-10 DIAGNOSIS — J189 Pneumonia, unspecified organism: Secondary | ICD-10-CM | POA: Diagnosis not present

## 2016-09-10 DIAGNOSIS — Z452 Encounter for adjustment and management of vascular access device: Secondary | ICD-10-CM

## 2016-09-10 DIAGNOSIS — E870 Hyperosmolality and hypernatremia: Secondary | ICD-10-CM | POA: Diagnosis not present

## 2016-09-10 DIAGNOSIS — K567 Ileus, unspecified: Secondary | ICD-10-CM

## 2016-09-10 DIAGNOSIS — F13239 Sedative, hypnotic or anxiolytic dependence with withdrawal, unspecified: Secondary | ICD-10-CM | POA: Diagnosis not present

## 2016-09-10 DIAGNOSIS — L0291 Cutaneous abscess, unspecified: Secondary | ICD-10-CM

## 2016-09-10 DIAGNOSIS — I248 Other forms of acute ischemic heart disease: Secondary | ICD-10-CM | POA: Diagnosis not present

## 2016-09-10 DIAGNOSIS — Z880 Allergy status to penicillin: Secondary | ICD-10-CM

## 2016-09-10 DIAGNOSIS — E876 Hypokalemia: Secondary | ICD-10-CM | POA: Diagnosis not present

## 2016-09-10 DIAGNOSIS — D72829 Elevated white blood cell count, unspecified: Secondary | ICD-10-CM | POA: Diagnosis not present

## 2016-09-10 DIAGNOSIS — B9620 Unspecified Escherichia coli [E. coli] as the cause of diseases classified elsewhere: Secondary | ICD-10-CM | POA: Diagnosis present

## 2016-09-10 DIAGNOSIS — K7200 Acute and subacute hepatic failure without coma: Secondary | ICD-10-CM | POA: Diagnosis not present

## 2016-09-10 DIAGNOSIS — B9689 Other specified bacterial agents as the cause of diseases classified elsewhere: Secondary | ICD-10-CM | POA: Diagnosis not present

## 2016-09-10 DIAGNOSIS — D75839 Thrombocytosis, unspecified: Secondary | ICD-10-CM | POA: Diagnosis not present

## 2016-09-10 DIAGNOSIS — R0603 Acute respiratory distress: Secondary | ICD-10-CM

## 2016-09-10 DIAGNOSIS — Z8719 Personal history of other diseases of the digestive system: Secondary | ICD-10-CM | POA: Diagnosis not present

## 2016-09-10 DIAGNOSIS — E785 Hyperlipidemia, unspecified: Secondary | ICD-10-CM | POA: Diagnosis present

## 2016-09-10 DIAGNOSIS — J9690 Respiratory failure, unspecified, unspecified whether with hypoxia or hypercapnia: Secondary | ICD-10-CM

## 2016-09-10 DIAGNOSIS — R451 Restlessness and agitation: Secondary | ICD-10-CM | POA: Diagnosis not present

## 2016-09-10 LAB — CBC WITH DIFFERENTIAL/PLATELET
BASOS PCT: 0 %
Basophils Absolute: 0 10*3/uL (ref 0.0–0.1)
EOS PCT: 0 %
Eosinophils Absolute: 0 10*3/uL (ref 0.0–0.7)
HEMATOCRIT: 22.5 % — AB (ref 36.0–46.0)
Hemoglobin: 6.9 g/dL — CL (ref 12.0–15.0)
Lymphocytes Relative: 4 %
Lymphs Abs: 0.8 10*3/uL (ref 0.7–4.0)
MCH: 25.1 pg — ABNORMAL LOW (ref 26.0–34.0)
MCHC: 30.7 g/dL (ref 30.0–36.0)
MCV: 81.8 fL (ref 78.0–100.0)
MONO ABS: 0.7 10*3/uL (ref 0.1–1.0)
MONOS PCT: 4 %
Neutro Abs: 16.4 10*3/uL — ABNORMAL HIGH (ref 1.7–7.7)
Neutrophils Relative %: 92 %
PLATELETS: 202 10*3/uL (ref 150–400)
RBC: 2.75 MIL/uL — ABNORMAL LOW (ref 3.87–5.11)
RDW: 18.3 % — AB (ref 11.5–15.5)
WBC: 17.8 10*3/uL — ABNORMAL HIGH (ref 4.0–10.5)

## 2016-09-10 LAB — BLOOD GAS, ARTERIAL
ACID-BASE DEFICIT: 1.5 mmol/L (ref 0.0–2.0)
BICARBONATE: 24.5 mmol/L (ref 20.0–28.0)
Drawn by: 44898
FIO2: 70
MECHVT: 410 mL
O2 Saturation: 92.2 %
PATIENT TEMPERATURE: 98.6
PCO2 ART: 55.6 mmHg — AB (ref 32.0–48.0)
PEEP/CPAP: 10 cmH2O
PH ART: 7.267 — AB (ref 7.350–7.450)
PO2 ART: 71.5 mmHg — AB (ref 83.0–108.0)
RATE: 30 resp/min

## 2016-09-10 LAB — URINALYSIS, ROUTINE W REFLEX MICROSCOPIC
BACTERIA UA: NONE SEEN
BILIRUBIN URINE: NEGATIVE
Glucose, UA: 150 mg/dL — AB
Ketones, ur: 5 mg/dL — AB
NITRITE: NEGATIVE
PH: 6 (ref 5.0–8.0)
Protein, ur: NEGATIVE mg/dL
SPECIFIC GRAVITY, URINE: 1.016 (ref 1.005–1.030)

## 2016-09-10 LAB — POCT I-STAT 3, ART BLOOD GAS (G3+)
ACID-BASE DEFICIT: 1 mmol/L (ref 0.0–2.0)
BICARBONATE: 25.3 mmol/L (ref 20.0–28.0)
O2 SAT: 96 %
PCO2 ART: 49.6 mmHg — AB (ref 32.0–48.0)
PO2 ART: 88 mmHg (ref 83.0–108.0)
Patient temperature: 97.8
TCO2: 27 mmol/L (ref 0–100)
pH, Arterial: 7.313 — ABNORMAL LOW (ref 7.350–7.450)

## 2016-09-10 LAB — GLUCOSE, CAPILLARY
Glucose-Capillary: 103 mg/dL — ABNORMAL HIGH (ref 65–99)
Glucose-Capillary: 110 mg/dL — ABNORMAL HIGH (ref 65–99)
Glucose-Capillary: 99 mg/dL (ref 65–99)

## 2016-09-10 LAB — COMPREHENSIVE METABOLIC PANEL
ALT: 41 U/L (ref 14–54)
ANION GAP: 3 — AB (ref 5–15)
AST: 58 U/L — ABNORMAL HIGH (ref 15–41)
Albumin: 1.5 g/dL — ABNORMAL LOW (ref 3.5–5.0)
Alkaline Phosphatase: 87 U/L (ref 38–126)
BUN: 10 mg/dL (ref 6–20)
CHLORIDE: 108 mmol/L (ref 101–111)
CO2: 21 mmol/L — ABNORMAL LOW (ref 22–32)
Calcium: 5.9 mg/dL — CL (ref 8.9–10.3)
Creatinine, Ser: 0.62 mg/dL (ref 0.44–1.00)
GFR calc Af Amer: >60 mL/min (ref >60)
GFR calc non Af Amer: >60 mL/min (ref >60)
Glucose, Bld: 400 mg/dL — ABNORMAL HIGH (ref 65–99)
POTASSIUM: 4.4 mmol/L (ref 3.5–5.1)
Sodium: 132 mmol/L — ABNORMAL LOW (ref 135–145)
TOTAL PROTEIN: 3.7 g/dL — AB (ref 6.5–8.1)

## 2016-09-10 LAB — MRSA PCR SCREENING: MRSA by PCR: NEGATIVE

## 2016-09-10 LAB — HEMOGLOBIN AND HEMATOCRIT, BLOOD
HEMATOCRIT: 30.5 % — AB (ref 36.0–46.0)
Hemoglobin: 9.4 g/dL — ABNORMAL LOW (ref 12.0–15.0)

## 2016-09-10 LAB — TROPONIN I
TROPONIN I: 1 ng/mL — AB (ref <0.03)
TROPONIN I: 0.41 ng/mL — AB (ref <0.03)

## 2016-09-10 LAB — PROTIME-INR
INR: 1.65
PROTHROMBIN TIME: 19.8 s — AB (ref 11.4–15.2)

## 2016-09-10 LAB — TRIGLYCERIDES: Triglycerides: 139 mg/dL (ref <150)

## 2016-09-10 LAB — PREPARE RBC (CROSSMATCH)

## 2016-09-10 LAB — MAGNESIUM: MAGNESIUM: 1.3 mg/dL — AB (ref 1.7–2.4)

## 2016-09-10 LAB — PROCALCITONIN: Procalcitonin: 13.61 ng/mL

## 2016-09-10 LAB — ABO/RH: ABO/RH(D): B POS

## 2016-09-10 LAB — PHOSPHORUS: Phosphorus: 1.9 mg/dL — ABNORMAL LOW (ref 2.5–4.6)

## 2016-09-10 MED ORDER — INSULIN ASPART 100 UNIT/ML ~~LOC~~ SOLN
1.0000 [IU] | SUBCUTANEOUS | Status: DC
  Administered 2016-09-11 – 2016-09-12 (×4): 1 [IU] via SUBCUTANEOUS
  Administered 2016-09-12: 2 [IU] via SUBCUTANEOUS
  Administered 2016-09-12: 1 [IU] via SUBCUTANEOUS
  Administered 2016-09-13 (×2): 2 [IU] via SUBCUTANEOUS

## 2016-09-10 MED ORDER — SODIUM CHLORIDE 0.9% FLUSH
10.0000 mL | Freq: Two times a day (BID) | INTRAVENOUS | Status: DC
  Administered 2016-09-10 – 2016-09-15 (×10): 10 mL
  Administered 2016-09-16: 20 mL
  Administered 2016-09-17 – 2016-09-23 (×14): 10 mL
  Administered 2016-09-24: 20 mL
  Administered 2016-09-24 – 2016-09-26 (×3): 10 mL
  Administered 2016-09-26: 20 mL
  Administered 2016-09-27 – 2016-09-28 (×3): 10 mL

## 2016-09-10 MED ORDER — SODIUM CHLORIDE 0.9 % IV SOLN
Freq: Once | INTRAVENOUS | Status: AC
  Administered 2016-09-10: 17:00:00 via INTRAVENOUS

## 2016-09-10 MED ORDER — MIDAZOLAM HCL 2 MG/2ML IJ SOLN
2.0000 mg | Freq: Once | INTRAMUSCULAR | Status: AC
  Administered 2016-09-10: 2 mg via INTRAVENOUS

## 2016-09-10 MED ORDER — FUROSEMIDE 10 MG/ML IJ SOLN
20.0000 mg | Freq: Once | INTRAMUSCULAR | Status: AC
  Administered 2016-09-10: 20 mg via INTRAVENOUS
  Filled 2016-09-10: qty 2

## 2016-09-10 MED ORDER — SODIUM CHLORIDE 0.9% FLUSH
10.0000 mL | INTRAVENOUS | Status: DC | PRN
  Administered 2016-09-17: 10 mL
  Administered 2016-09-26: 20 mL
  Administered 2016-09-26: 40 mL
  Filled 2016-09-10 (×3): qty 40

## 2016-09-10 MED ORDER — MIDAZOLAM HCL 2 MG/2ML IJ SOLN
INTRAMUSCULAR | Status: AC
  Filled 2016-09-10: qty 2

## 2016-09-10 MED ORDER — ORAL CARE MOUTH RINSE
15.0000 mL | OROMUCOSAL | Status: DC
  Administered 2016-09-10 – 2016-09-17 (×68): 15 mL via OROMUCOSAL

## 2016-09-10 MED ORDER — SODIUM CHLORIDE 0.9 % IV SOLN
1.0000 g | Freq: Once | INTRAVENOUS | Status: AC
  Administered 2016-09-10: 1 g via INTRAVENOUS
  Filled 2016-09-10: qty 10

## 2016-09-10 MED ORDER — FENTANYL 2500MCG IN NS 250ML (10MCG/ML) PREMIX INFUSION
25.0000 ug/h | INTRAVENOUS | Status: DC
  Administered 2016-09-10: 100 ug/h via INTRAVENOUS
  Administered 2016-09-11 (×3): 250 ug/h via INTRAVENOUS
  Administered 2016-09-12: 300 ug/h via INTRAVENOUS
  Administered 2016-09-12: 250 ug/h via INTRAVENOUS
  Administered 2016-09-13: 200 ug/h via INTRAVENOUS
  Administered 2016-09-13: 350 ug/h via INTRAVENOUS
  Administered 2016-09-14 – 2016-09-15 (×2): 100 ug/h via INTRAVENOUS
  Filled 2016-09-10 (×10): qty 250

## 2016-09-10 MED ORDER — PROPOFOL 1000 MG/100ML IV EMUL: 0.0000 ug/kg/min | INTRAVENOUS | Status: DC

## 2016-09-10 MED ORDER — PANTOPRAZOLE SODIUM 40 MG IV SOLR
40.0000 mg | Freq: Every day | INTRAVENOUS | Status: DC
  Administered 2016-09-10 – 2016-09-18 (×9): 40 mg via INTRAVENOUS
  Filled 2016-09-10 (×11): qty 40

## 2016-09-10 MED ORDER — ASPIRIN 300 MG RE SUPP
150.0000 mg | Freq: Every day | RECTAL | Status: DC
  Administered 2016-09-10 – 2016-09-17 (×8): 150 mg via RECTAL
  Filled 2016-09-10 (×8): qty 1

## 2016-09-10 MED ORDER — CHLORHEXIDINE GLUCONATE 0.12% ORAL RINSE (MEDLINE KIT)
15.0000 mL | Freq: Two times a day (BID) | OROMUCOSAL | Status: DC
  Administered 2016-09-10 – 2016-09-17 (×14): 15 mL via OROMUCOSAL

## 2016-09-10 MED ORDER — FENTANYL CITRATE (PF) 100 MCG/2ML IJ SOLN
50.0000 ug | Freq: Once | INTRAMUSCULAR | Status: AC
  Administered 2016-09-10: 50 ug via INTRAVENOUS

## 2016-09-10 MED ORDER — HYDROCORTISONE NA SUCCINATE PF 100 MG IJ SOLR
50.0000 mg | Freq: Four times a day (QID) | INTRAMUSCULAR | Status: DC
  Administered 2016-09-10 – 2016-09-11 (×4): 50 mg via INTRAVENOUS
  Filled 2016-09-10 (×4): qty 2

## 2016-09-10 MED ORDER — FENTANYL BOLUS VIA INFUSION
50.0000 ug | INTRAVENOUS | Status: DC | PRN
Start: 2016-09-10 — End: 2016-09-16
  Administered 2016-09-12 – 2016-09-14 (×5): 50 ug via INTRAVENOUS
  Filled 2016-09-10: qty 50

## 2016-09-10 MED ORDER — CHLORHEXIDINE GLUCONATE CLOTH 2 % EX PADS
6.0000 | MEDICATED_PAD | Freq: Every day | CUTANEOUS | Status: DC
  Administered 2016-09-10 – 2016-09-24 (×17): 6 via TOPICAL

## 2016-09-10 MED ORDER — MIDAZOLAM HCL 2 MG/2ML IJ SOLN
1.0000 mg | INTRAMUSCULAR | Status: DC | PRN
  Administered 2016-09-11: 1 mg via INTRAVENOUS
  Administered 2016-09-11 – 2016-09-12 (×4): 2 mg via INTRAVENOUS
  Filled 2016-09-10 (×4): qty 2

## 2016-09-10 MED ORDER — HEPARIN SODIUM (PORCINE) 5000 UNIT/ML IJ SOLN
5000.0000 [IU] | Freq: Three times a day (TID) | INTRAMUSCULAR | Status: DC
  Administered 2016-09-10 – 2016-09-21 (×33): 5000 [IU] via SUBCUTANEOUS
  Filled 2016-09-10 (×37): qty 1

## 2016-09-10 NOTE — Progress Notes (Signed)
eLink Physician-Brief Progress Note Patient Name: Brandi Leonard DOB: 1965/06/05 MRN: [id number]   Date of Service  09/10/2016  HPI/Events of Note  ABG on 70%/PRVC 35/P 12 = 7.31/49.6/88.0/27  eICU Interventions  Continue present ventilator management.      Intervention Category Major Interventions: Respiratory failure - evaluation and management  Keiasha Diep Eugene 09/10/2016, 8:01 PM

## 2016-09-10 NOTE — Progress Notes (Addendum)
eLink Physician-Brief Progress Note Patient Name: DAIANA VITIELLO DOB: [birth year] MRN: [id number]   Date of Service  09/10/2016  HPI/Events of Note  Troponin = 1.0. Demand ischemia?  eICU Interventions  Will order: 1. ASA Suppository 150 mg PR now and Q day. 2. 12 Lead EKG now.  3. Continue to trend Troponin.      Intervention Category Major Interventions: Electrolyte abnormality - evaluation and management Intermediate Interventions: Diagnostic test evaluation  Sommer,Steven Eugene 09/10/2016, 7:49 PM

## 2016-09-10 NOTE — Procedures (Signed)
Central Venous Catheter Insertion Procedure Note Brandi Leonard [id number] 10/12/1965  Procedure: Insertion of Central Venous Catheter Indications: Assessment of intravascular volume  Procedure Details Consent: Unable to obtain consent because of emergent medical necessity. Time Out: Verified patient identification, verified procedure, site/side was marked, verified correct patient position, special equipment/implants available, medications/allergies/relevent history reviewed, required imaging and test results available.  Performed  Maximum sterile technique was used including antiseptics, cap, gloves, gown, hand hygiene, mask and sheet. Skin prep: Chlorhexidine; local anesthetic administered A antimicrobial bonded/coated triple lumen catheter was placed in the right internal jugular vein using the Seldinger technique at 15 cm.  Line sutured.  Biopatch applied.  Evaluation Blood flow good Complications: No apparent complications Patient did tolerate procedure well. Chest X-ray ordered to verify placement.  CXR: pending.  Procedure performed with ultrasound guidance for real time vessel cannulation.      Kennieth Rad, AGACNP-BC [facility] Pgr: [phone] or if no answer [phone] 09/10/2016, 3:44 PM

## 2016-09-10 NOTE — H&P (Signed)
PULMONARY / CRITICAL CARE MEDICINE   Name: Brandi Leonard MRN: [id number] DOB: 04-18-1965    ADMISSION DATE:  09/10/2016 CONSULTATION DATE:  09/10/2016  REFERRING MD:  Dr. [facility]  CHIEF COMPLAINT:  shock  HISTORY OF PRESENT ILLNESS:   51 year old female with PMH significant for cholecystitis, SBO, anemia, COPD, and anxitey. She presented to [facility] ED6/11 with complaints of diffuse abdominal pain x 1 day. She developed nausea and vomiting prompting her to present to the emergency department. CT scan was done in the ED and demonstrated multiple dilated loops of fluid filled small bowel consistent with mechanical small bowel obstruction. She was initially managed conservatively, however, she suffered bowel perforation 6/12 and was taken emergently to the OR for repair. Also treated with antibiotics Primaxin, Levaquin, and flagyl. She was initially able to be extubated post op, but developed respiratory distress and required re-intubation. She developed diffuse pulmonary infiltrates concerning for ARDS vs Pulmonary Edema. She reportedly received 25L IVF over the course of her admission. She also developed shock treated with vasoactive infusions once IVF resuscitation had failed. Despite continued aggressive care, she remained unable to wean from the ventilator or come off pressors. 6/15 she was transferred to [facility] for further evaluation.   PAST MEDICAL HISTORY :  She  has no past medical history on file.  PAST SURGICAL HISTORY: She  has no past surgical history on file.  Allergies not on file  No current facility-administered medications on file prior to encounter.    No current outpatient prescriptions on file prior to encounter.    FAMILY HISTORY:  Her has no family status information on file.    SOCIAL HISTORY: She    REVIEW OF SYSTEMS:   unable  SUBJECTIVE:    VITAL SIGNS: BP 116/89   Pulse (!) 109   Temp 98.8 F (37.1 C) (Oral)   Resp 18   Ht  5\' 5"  (1.651 m)   Wt 71.7 kg (158 lb 1.1 oz)   SpO2 91%   BMI 26.30 kg/m   HEMODYNAMICS:    VENTILATOR SETTINGS: FiO2 (%):  [70 %] 70 %  INTAKE / OUTPUT: No intake/output data recorded.  PHYSICAL EXAMINATION: General:  Adult female on vent Neuro:  Sedated HEENT:  [place]/AT, PERRL, no appreciable JVD Cardiovascular:  RRR, no MRG. + edema to peripheries, very tight.  Lungs:  Coarse bilateral breath sounds Abdomen:  Midline longitudinal incision. Open abdomen.  Musculoskeletal:  No acute deformity Skin: Grossly intact with exception of surgical wound  LABS:  BMET No results for input(s): NA, K, CL, CO2, BUN, CREATININE, GLUCOSE in the last 168 hours.  Electrolytes No results for input(s): CALCIUM, MG, PHOS in the last 168 hours.  CBC No results for input(s): WBC, HGB, HCT, PLT in the last 168 hours.  Coag's No results for input(s): APTT, INR in the last 168 hours.  Sepsis Markers No results for input(s): LATICACIDVEN, PROCALCITON, O2SATVEN in the last 168 hours.  ABG No results for input(s): PHART, PCO2ART, PO2ART in the last 168 hours.  Liver Enzymes No results for input(s): AST, ALT, ALKPHOS, BILITOT, ALBUMIN in the last 168 hours.  Cardiac Enzymes No results for input(s): TROPONINI, PROBNP in the last 168 hours.  Glucose No results for input(s): GLUCAP in the last 168 hours.  Imaging No results found.   STUDIES:  CT abdomen 6/11 > multiple dilated loops of fluid filled small bowel consistent with mechanical small bowel obstruction  CULTURES: Blood 6/12 > Coag  neg staph 1/2 bottles  ANTIBIOTICS: Primaxin 6/12 > Levaquin 6/12 Flagyl 6/12 >  SIGNIFICANT EVENTS: 6/11 admit for SBO 6/12 perf to OR for repair 6/13 contnue shock, resp failure 6/15 transfer to [facility] for ICU admit.  LINES/TUBES: ETT 6/13 > R fem CVL 6/12 >6/15 Art line 6/15 > IJ CVL 6/15  DISCUSSION:   ASSESSMENT / PLAN:  PULMONARY A: Acute hypoxemic respiratory  failure ARDS Pulmonary edema: volume overload COPD without acute exacerbation  P:   Full vent support: PRVC 8cc/Kg IBW, rate 30, FiO2 90, PEEP 12 CXR ABG VAP bundle PRN albuterol  CARDIOVASCULAR A:  Shock: likely septic due to peritonitis. H/o HLD  P:  Telemetry monitoring Lock IVF Norepinephrine for MAP goal > 84mmHg Echo Troponin cycle  RENAL A:   Hypokalemia  Hypernatremia Hyperchloremia  P:   Saline lock IVF CMP now, mag, phos Strict I&O Hold diuretics now for shock  GASTROINTESTINAL A:   SBO complicated by perforation now s/p repair 6/12 Open abdomen  P:   NPO OGT to LIS Consult surgery, were planning to start TPN today at [place] abd per surgery  HEMATOLOGIC A:   Anemia  P:  CBC, coags, type and screen.  Subcutaneous heparin  INFECTIOUS A:   Septic shock secondary to peritonitis  P:   Continue ABX as above Pan culture PCT baseline  ENDOCRINE A:   Adrenal insufficiency (cortisol 12 at [place])   P:   Continue stress dose steroids CBG monitoring and SSI  NEUROLOGIC A:   Acute metabolic encephalopathy Occasional jerking movements  P:   RASS goal: -4 to -5 for vent synchrony  Fentanyl and propofol infusions Consider CT head/ EEG  FAMILY  - Updates: Mother and father updated 6/15   - Inter-disciplinary family meet or Palliative Care meeting due by:  6/20   Georgann Housekeeper, AGACNP-BC [facility] Pager [phone] or [phone]  09/10/2016 3:05 PM

## 2016-09-10 NOTE — Progress Notes (Signed)
eLink Physician-Brief Progress Note Patient Name: Brandi Leonard DOB: [birth year] MRN: [id number]   Date of Service  09/10/2016  HPI/Events of Note  Ca++ = 5.9 and Albumin = 1.5. Ca++ corrected for Albumin = 7.9.  eICU Interventions  Will replace Ca++.     Intervention Category Major Interventions: Electrolyte abnormality - evaluation and management  Sommer,Steven Eugene 09/10/2016, 7:46 PM

## 2016-09-10 NOTE — Procedures (Signed)
Arterial Catheter Insertion Procedure Note Brandi Leonard [id number] 1965/05/07  Procedure: Insertion of Arterial Catheter  Indications: Blood pressure monitoring and Frequent blood sampling  Procedure Details Consent: Unable to obtain consent because of altered level of consciousness. Time Out: Verified patient identification, verified procedure, site/side was marked, verified correct patient position, special equipment/implants available, medications/allergies/relevent history reviewed, required imaging and test results available.  Performed  Maximum sterile technique was used including antiseptics, cap, gloves, gown, hand hygiene, mask and sheet. Skin prep: Chlorhexidine 22 gauge catheter was inserted into left radial artery using the Seldinger technique.  Biopatch applied and sterile dressing placed.   Evaluation Blood flow good; BP tracing good. Complications: No apparent complications.  Procedure performed with ultrasound guidance for real time vessel cannulation.     Kennieth Rad, AGACNP-BC [place] [facility] Pgr: [phone] or if no answer [phone] 09/10/2016, 3:46 PM

## 2016-09-10 NOTE — Progress Notes (Addendum)
eLink Physician-Brief Progress Note Patient Name: Brandi Leonard DOB: Jun 23, 1965 MRN: [id number]   Date of Service  09/10/2016  HPI/Events of Note  ABG on 70%/PRVC 30/TV 410/P 10 = 7.27/55.6/71.5/24.5.  eICU Interventions  Will order: 1. Increase PRVC rate to 35 and PEEP to 12.  2. ABG at 6:30 PM.     Intervention Category Major Interventions: Acid-Base disturbance - evaluation and management;Respiratory failure - evaluation and management  Lysle Dingwall 09/10/2016, 5:34 PM

## 2016-09-11 ENCOUNTER — Inpatient Hospital Stay (HOSPITAL_COMMUNITY): Payer: BLUE CROSS/BLUE SHIELD

## 2016-09-11 DIAGNOSIS — I340 Nonrheumatic mitral (valve) insufficiency: Secondary | ICD-10-CM

## 2016-09-11 LAB — CBC
HEMATOCRIT: 27.7 % — AB (ref 36.0–46.0)
HEMOGLOBIN: 8.7 g/dL — AB (ref 12.0–15.0)
MCH: 25.2 pg — ABNORMAL LOW (ref 26.0–34.0)
MCHC: 31.4 g/dL (ref 30.0–36.0)
MCV: 80.3 fL (ref 78.0–100.0)
Platelets: 167 10*3/uL (ref 150–400)
RBC: 3.45 MIL/uL — AB (ref 3.87–5.11)
RDW: 17.2 % — ABNORMAL HIGH (ref 11.5–15.5)
WBC: 15.9 10*3/uL — ABNORMAL HIGH (ref 4.0–10.5)

## 2016-09-11 LAB — POCT I-STAT 3, ART BLOOD GAS (G3+)
ACID-BASE EXCESS: 4 mmol/L — AB (ref 0.0–2.0)
ACID-BASE EXCESS: 5 mmol/L — AB (ref 0.0–2.0)
BICARBONATE: 29.5 mmol/L — AB (ref 20.0–28.0)
Bicarbonate: 27.6 mmol/L (ref 20.0–28.0)
O2 SAT: 92 %
O2 Saturation: 96 %
PH ART: 7.445 (ref 7.350–7.450)
TCO2: 29 mmol/L (ref 0–100)
TCO2: 31 mmol/L (ref 0–100)
pCO2 arterial: 34.9 mmHg (ref 32.0–48.0)
pCO2 arterial: 42.9 mmHg (ref 32.0–48.0)
pH, Arterial: 7.506 — ABNORMAL HIGH (ref 7.350–7.450)
pO2, Arterial: 58 mmHg — ABNORMAL LOW (ref 83.0–108.0)
pO2, Arterial: 82 mmHg — ABNORMAL LOW (ref 83.0–108.0)

## 2016-09-11 LAB — HIV ANTIBODY (ROUTINE TESTING W REFLEX): HIV SCREEN 4TH GENERATION: NONREACTIVE

## 2016-09-11 LAB — URINE CULTURE: Culture: NO GROWTH

## 2016-09-11 LAB — BPAM RBC
Blood Product Expiration Date: 201806202359
ISSUE DATE / TIME: 201806151648
Unit Type and Rh: 1700

## 2016-09-11 LAB — BASIC METABOLIC PANEL
Anion gap: 6 (ref 5–15)
BUN: 15 mg/dL (ref 6–20)
CHLORIDE: 112 mmol/L — AB (ref 101–111)
CO2: 27 mmol/L (ref 22–32)
Calcium: 7.7 mg/dL — ABNORMAL LOW (ref 8.9–10.3)
Creatinine, Ser: 0.66 mg/dL (ref 0.44–1.00)
GFR calc non Af Amer: >60 mL/min (ref >60)
Glucose, Bld: 105 mg/dL — ABNORMAL HIGH (ref 65–99)
POTASSIUM: 4.2 mmol/L (ref 3.5–5.1)
SODIUM: 145 mmol/L (ref 135–145)

## 2016-09-11 LAB — GLUCOSE, CAPILLARY
GLUCOSE-CAPILLARY: 114 mg/dL — AB (ref 65–99)
GLUCOSE-CAPILLARY: 102 mg/dL — AB (ref 65–99)
GLUCOSE-CAPILLARY: 99 mg/dL (ref 65–99)
Glucose-Capillary: 92 mg/dL (ref 65–99)
Glucose-Capillary: 124 mg/dL — ABNORMAL HIGH (ref 65–99)

## 2016-09-11 LAB — TYPE AND SCREEN
ABO/RH(D): B POS
Antibody Screen: NEGATIVE
Unit division: 0

## 2016-09-11 LAB — ECHOCARDIOGRAM COMPLETE
Height: 65 in
WEIGHTICAEL: 2472.68 [oz_av]

## 2016-09-11 LAB — MAGNESIUM: MAGNESIUM: 1.4 mg/dL — AB (ref 1.7–2.4)

## 2016-09-11 LAB — TROPONIN I: Troponin I: 0.64 ng/mL (ref <0.03)

## 2016-09-11 LAB — PHOSPHORUS: PHOSPHORUS: 1.7 mg/dL — AB (ref 2.5–4.6)

## 2016-09-11 MED ORDER — M.V.I. ADULT IV INJ
INTRAVENOUS | Status: AC
  Administered 2016-09-11: 19:00:00 via INTRAVENOUS
  Filled 2016-09-11: qty 960

## 2016-09-11 MED ORDER — FUROSEMIDE 10 MG/ML IJ SOLN
40.0000 mg | Freq: Once | INTRAMUSCULAR | Status: AC
  Administered 2016-09-11: 40 mg via INTRAVENOUS
  Filled 2016-09-11: qty 4

## 2016-09-11 MED ORDER — SODIUM CHLORIDE 0.9 % IV SOLN
1.0000 g | Freq: Three times a day (TID) | INTRAVENOUS | Status: DC
  Administered 2016-09-11 – 2016-09-16 (×15): 1 g via INTRAVENOUS
  Filled 2016-09-11 (×16): qty 1

## 2016-09-11 MED ORDER — SODIUM PHOSPHATES 45 MMOLE/15ML IV SOLN
10.0000 mmol | Freq: Once | INTRAVENOUS | Status: AC
  Administered 2016-09-11: 10 mmol via INTRAVENOUS
  Filled 2016-09-11: qty 3.33

## 2016-09-11 MED ORDER — METRONIDAZOLE IN NACL 5-0.79 MG/ML-% IV SOLN
500.0000 mg | Freq: Three times a day (TID) | INTRAVENOUS | Status: DC
  Administered 2016-09-11 – 2016-09-16 (×15): 500 mg via INTRAVENOUS
  Filled 2016-09-11 (×15): qty 100

## 2016-09-11 MED ORDER — MAGNESIUM SULFATE 2 GM/50ML IV SOLN
2.0000 g | Freq: Once | INTRAVENOUS | Status: AC
  Administered 2016-09-11: 2 g via INTRAVENOUS
  Filled 2016-09-11: qty 50

## 2016-09-11 MED ORDER — POTASSIUM PHOSPHATES 15 MMOLE/5ML IV SOLN
10.0000 mmol | Freq: Once | INTRAVENOUS | Status: AC
  Administered 2016-09-11: 10 mmol via INTRAVENOUS
  Filled 2016-09-11: qty 3.33

## 2016-09-11 NOTE — Progress Notes (Signed)
eLink Physician-Brief Progress Note Patient Name: Brandi Leonard DOB: 03-19-1966 MRN: [id number]   Date of Service  09/11/2016  HPI/Events of Note  ABG on 50%/PRVC 30/TV 410/P 8 = 7.50/34.9/58  eICU Interventions  Will order: 1. Increase PEEP to 10. 2. Decrease PRVC rate to 24. 3. ABG at 5:30 PM.     Intervention Category Major Interventions: Acid-Base disturbance - evaluation and management;Hypoxemia - evaluation and management;Respiratory failure - evaluation and management  Lysle Dingwall 09/11/2016, 4:24 PM

## 2016-09-11 NOTE — Progress Notes (Signed)
  Echocardiogram 2D Echocardiogram has been performed.  Brandi Leonard 09/11/2016, 11:21 AM

## 2016-09-11 NOTE — Progress Notes (Signed)
   Subjective/Chief Complaint: Pt with no acute changes    Objective: Vital signs in last 24 hours: Temp:  [97.8 F (36.6 C)-98.8 F (37.1 C)] 98.5 F (36.9 C) (06/16 0324) Pulse Rate:  [72-109] 84 (06/16 0745) Resp:  [0-49] 8 (06/16 0745) BP: (86-140)/(61-104) 99/68 (06/16 0745) SpO2:  [90 %-100 %] 98 % (06/16 0745) Arterial Line BP: (71-133)/(36-75) 71/65 (06/16 0745) FiO2 (%):  [65 %-70 %] 65 % (06/16 0309) Weight:  [70.1 kg (154 lb 8.7 oz)-71.7 kg (158 lb 1.1 oz)] 70.1 kg (154 lb 8.7 oz) (06/16 0500) Last BM Date:  (PTA)  Intake/Output from previous day: 06/15 0701 - 06/16 0700 In: 724.4 [I.V.:249.4; Blood:335; NG/GT:30; IV Piggyback:110] Out: 2263 [Urine:1680; Emesis/NG output:150] Intake/Output this shift: No intake/output data recorded.  General appearance: sedated Cardio: regular rate and rhythm, S1, S2 normal, no murmur, click, rub or gallop GI: soft, distended, hypoactive BS, incision c/d/i  Lab Results:   Recent Labs  09/10/16 1344 09/10/16 2005 09/11/16 0347  WBC 17.8*  --  15.9*  HGB 6.9* 9.4* 8.7*  HCT 22.5* 30.5* 27.7*  PLT 202  --  167   BMET  Recent Labs  09/10/16 1344 09/11/16 0347  NA 132* 145  K 4.4 4.2  CL 108 112*  CO2 21* 27  GLUCOSE 400* 105*  BUN 10 15  CREATININE 0.62 0.66  CALCIUM 5.9* 7.7*   PT/INR  Recent Labs  09/10/16 1344  LABPROT 19.8*  INR 1.65   ABG  Recent Labs  09/10/16 1645 09/10/16 1835  PHART 7.267* 7.313*  HCO3 24.5 25.3    Studies/Results: Dg Chest Port 1 View  Result Date: 09/11/2016 CLINICAL DATA:  Lung infiltrates EXAM: PORTABLE CHEST 1 VIEW COMPARISON:  09/10/2016 FINDINGS: Endotracheal tube in satisfactory position. Right jugular central venous catheter tip in the proximal SVC unchanged Diffuse bilateral airspace disease unchanged. This is most prominent in the left lower lobe. Probable small pleural effusions. IMPRESSION: Support lines unchanged in satisfactory position Diffuse bilateral  airspace disease with basilar predominance is stable. Electronically Signed   By: Franchot Gallo M.D.   On: 09/11/2016 07:27   Dg Chest Port 1 View  Result Date: 09/10/2016 CLINICAL DATA:  Central line placement EXAM: PORTABLE CHEST 1 VIEW COMPARISON:  09/10/2016 FINDINGS: Right IJ central line catheter is noted without associated pneumothorax. The tip projects over the expected location of the proximal SVC. An endotracheal tube tip is seen 5.5 cm above the carina. A gastric tube extends below the left hemidiaphragm though the tip is excluded on this study. Heart is borderline enlarged with diffuse scattered ground-glass opacities which may reflect sequela of mild pulmonary edema. Retrocardiac opacity and consolidation persists with small left effusion. IMPRESSION: 1. Satisfactory appearing support line and tube positions. 2. Diffuse ground-glass opacities which may reflect sequela pulmonary edema or atypical pneumonia. Persistent retrocardiac consolidation and probable small left pleural effusion. Electronically Signed   By: Ashley Royalty M.D.   On: 09/10/2016 15:18    Anti-infectives: Anti-infectives    None      Assessment/Plan: POD 4 from exlap and SBR- Dr. Brantley Stage 09/07/2016 Con't NGT to LIWS, await bowel function Mobilize as tol Vent wean per CCM Following along  LOS: 1 day    Rosario Jacks., [facility] 09/11/2016

## 2016-09-11 NOTE — Progress Notes (Signed)
Pharmacy Antibiotic Note  Brandi Leonard is a 51 y.o. female admitted on 09/10/2016 with intra-abdominal infection.  Pharmacy has been consulted for meropenem dosing.  Also on flagyl.  Received Primaxin at [facility] - last dose 6/15.  Plan: 1. Meropenem 1g IV q 8 hrs. 2. F/u cultures, renal function and clinical course.  Height: 5\' 5"  (165.1 cm) Weight: 154 lb 8.7 oz (70.1 kg) IBW/kg (Calculated) : 57  Temp (24hrs), Avg:98.2 F (36.8 C), Min:97.6 F (36.4 C), Max:98.8 F (37.1 C)   Recent Labs Lab 09/10/16 1344 09/11/16 0347  WBC 17.8* 15.9*  CREATININE 0.62 0.66    Estimated Creatinine Clearance: 84.4 mL/min (by C-G formula based on SCr of 0.66 mg/dL).    Allergies  Allergen Reactions  . Penicillins Rash    Has patient had a PCN reaction causing immediate rash, facial/tongue/throat swelling, SOB or lightheadedness with hypotension: Unknown Has patient had a PCN reaction causing severe rash involving mucus membranes or skin necrosis: Unknown Has patient had a PCN reaction that required hospitalization: No Has patient had a PCN reaction occurring within the last 10 years: No If all of the above answers are "NO", then may proceed with Cephalosporin use.     Antimicrobials this admission:  Flagyl 6/11 >>  Meropenem 6/15 >>  Levaquin 6/11 > 6/14 @1530  Imipenem 6/12 > 6/15  Dose adjustments this admission:    Microbiology results:  6/15 BCx: ngtd 6/15 UCx: neg 6/15 Sputum: pending 6/15 MRSA PCR: neg  Wiley Cultures -6/13 Trach asp > moderate yeast -6/13 BCx x 2 -neg (F) -6/13 UCx - neg -6/14 BCx 1/2 w/ CNS   Thank you for allowing pharmacy to be a part of this patient's care.  Uvaldo Rising, BCPS  Clinical Pharmacist Pager [phone]  09/11/2016 1:03 PM

## 2016-09-11 NOTE — Plan of Care (Signed)
Problem: Activity: Goal: Ability to tolerate increased activity will improve Outcome: Not Progressing Pt remains intubated and sedated  Problem: Coping: Goal: Level of anxiety will decrease Outcome: Not Progressing Still requiring sedation  Problem: Nutritional: Goal: Intake of prescribed amount of daily calories will improve Outcome: Not Progressing MD to round  Problem: Respiratory: Goal: Ability to maintain a clear airway and adequate ventilation will improve Outcome: Progressing Weaning vent settings per RT  Problem: Role Relationship: Goal: Method of communication will improve Outcome: Not Progressing Intubated and sedated

## 2016-09-11 NOTE — Progress Notes (Signed)
PULMONARY / CRITICAL CARE MEDICINE   Name: Brandi Leonard MRN: [id number] DOB: 03/31/1965    ADMISSION DATE:  09/10/2016 CONSULTATION DATE:  09/10/2016  REFERRING MD:  Dr. [facility]  CHIEF COMPLAINT:  shock  HISTORY OF PRESENT ILLNESS:   51 year old admitted to [facility] 6/11 with Small bowel obstruction, underwent emergent laparotomy on 6/12 for perforation with peritonitis. Failed extubation postoperatively and then developed diffuse bilateral infiltrates consistent with ARDS and septic shock requiring Levophed  STUDIES:  CT abdomen 6/11 > multiple dilated loops of fluid filled small bowel consistent with mechanical small bowel obstruction  CULTURES: Blood 6/12 > Coag neg staph 1/2 bottles  ANTIBIOTICS: Primaxin 6/12 > Levaquin 6/12 Flagyl 6/12 >  SIGNIFICANT EVENTS: 6/11 admit for SBO 6/12 perf to OR for repair 6/13 contnue shock, resp failure 6/15 transfer to [facility] for ICU admit.  LINES/TUBES: ETT 6/13 > R fem CVL 6/12 >6/15 Art line rt radial 6/15 > IJ CVL 6/15   SUBJECTIVE:  Remains critically ill on vent, 60%/ PEEP 10 Off pressors More awake on fent gtt   VITAL SIGNS: BP (!) 94/59   Pulse 75   Temp 97.8 F (36.6 C) (Axillary)   Resp (!) 0   Ht 5\' 5"  (1.651 m)   Wt 154 lb 8.7 oz (70.1 kg)   SpO2 96%   BMI 25.72 kg/m   HEMODYNAMICS: CVP:  [11 mmHg-24 mmHg] 11 mmHg  VENTILATOR SETTINGS: Vent Mode: PRVC FiO2 (%):  [60 %-70 %] 60 % Set Rate:  [30 bmp-35 bmp] 35 bmp Vt Set:  [410 mL] 410 mL PEEP:  [10 cmH20-12 cmH20] 10 cmH20 Plateau Pressure:  [20 cmH20-27 cmH20] 24 cmH20  INTAKE / OUTPUT: I/O last 3 completed shifts: In: 724.4 [I.V.:249.4; Blood:335; NG/GT:30; IV Piggyback:110] Out: 7829 [Urine:1680; Emesis/NG output:150]  PHYSICAL EXAMINATION: General:  Adult female on vent Neuro:  Sedated , follows commands when sedation off  HEENT:  [place]/AT, PERRL, no appreciable JVD Cardiovascular:  RRR, no MRG. + edema to  peripheries Lungs:  Coarse bilateral breath sounds Abdomen:  Midline longitudinal incision. Open abdomen.  Musculoskeletal:  No acute deformity Skin: Grossly intact with exception of surgical wound, ana sarca  LABS:  BMET  Recent Labs Lab 09/10/16 1344 09/11/16 0347  NA 132* 145  K 4.4 4.2  CL 108 112*  CO2 21* 27  BUN 10 15  CREATININE 0.62 0.66  GLUCOSE 400* 105*    Electrolytes  Recent Labs Lab 09/10/16 1344 09/11/16 0347  CALCIUM 5.9* 7.7*  MG 1.3* 1.4*  PHOS 1.9* 1.7*    CBC  Recent Labs Lab 09/10/16 1344 09/10/16 2005 09/11/16 0347  WBC 17.8*  --  15.9*  HGB 6.9* 9.4* 8.7*  HCT 22.5* 30.5* 27.7*  PLT 202  --  167    Coag's  Recent Labs Lab 09/10/16 1344  INR 1.65    Sepsis Markers  Recent Labs Lab 09/10/16 1344  PROCALCITON 13.61    ABG  Recent Labs Lab 09/10/16 1645 09/10/16 1835  PHART 7.267* 7.313*  PCO2ART 55.6* 49.6*  PO2ART 71.5* 88.0    Liver Enzymes  Recent Labs Lab 09/10/16 1344  AST 58*  ALT 41  ALKPHOS 87  BILITOT <0.1*  ALBUMIN 1.5*    Cardiac Enzymes  Recent Labs Lab 09/10/16 1344 09/10/16 2005 09/11/16 0347  TROPONINI 1.00* 0.41* 0.64*    Glucose  Recent Labs Lab 09/10/16 1804 09/10/16 2010 09/10/16 2336 09/11/16 0322 09/11/16 0831  GLUCAP 103* 110* 99 114* 102*  Imaging Dg Chest Port 1 View  Result Date: 09/11/2016 CLINICAL DATA:  Lung infiltrates EXAM: PORTABLE CHEST 1 VIEW COMPARISON:  09/10/2016 FINDINGS: Endotracheal tube in satisfactory position. Right jugular central venous catheter tip in the proximal SVC unchanged Diffuse bilateral airspace disease unchanged. This is most prominent in the left lower lobe. Probable small pleural effusions. IMPRESSION: Support lines unchanged in satisfactory position Diffuse bilateral airspace disease with basilar predominance is stable. Electronically Signed   By: Franchot Gallo M.D.   On: 09/11/2016 07:27   Dg Chest Port 1 View  Result  Date: 09/10/2016 CLINICAL DATA:  Central line placement EXAM: PORTABLE CHEST 1 VIEW COMPARISON:  09/10/2016 FINDINGS: Right IJ central line catheter is noted without associated pneumothorax. The tip projects over the expected location of the proximal SVC. An endotracheal tube tip is seen 5.5 cm above the carina. A gastric tube extends below the left hemidiaphragm though the tip is excluded on this study. Heart is borderline enlarged with diffuse scattered ground-glass opacities which may reflect sequela of mild pulmonary edema. Retrocardiac opacity and consolidation persists with small left effusion. IMPRESSION: 1. Satisfactory appearing support line and tube positions. 2. Diffuse ground-glass opacities which may reflect sequela pulmonary edema or atypical pneumonia. Persistent retrocardiac consolidation and probable small left pleural effusion. Electronically Signed   By: Ashley Royalty M.D.   On: 09/10/2016 15:18      DISCUSSION:  Shock -resolved, ARDS - improving hypoxia  ASSESSMENT / PLAN:  PULMONARY A: Acute hypoxemic respiratory failure ARDS Pulmonary edema: volume overload COPD without acute exacerbation  P:   Vent settings reviewed & adjusted - drop RR 30 to avoid autoPEEP, drop FIO2 & PEEP to 8  Rpt ABG on these settings PRN albuterol  CARDIOVASCULAR A:  Shock: likely septic due to peritonitis. Demand ischemia -trop peak at 1.0 H/o HLD  P:  Telemetry monitoring Norepinephrine for MAP goal > 77mmHg Await Echo   RENAL A:   Hypokalemia  Hypomag  P:   Monitor lytes daily & replace as needed Strict I&O Lasix 40 x 1  GASTROINTESTINAL A:   SBO complicated by perforation now s/p repair 6/12 Open abdomen  P:   NPO OGT to LIS  start TPN - while await return of bowel function Wound care per surgery  HEMATOLOGIC A:   Anemia  P:  CBC, coags, type and screen.  Subcutaneous heparin  INFECTIOUS A:   Septic shock secondary to peritonitis  P:   Continue  meropenem & flagyl Pan culture PCT baseline  ENDOCRINE A:   Relative Adrenal insufficiency (cortisol 12 at [place])   P:   dc stress dose steroids CBG monitoring and SSI  NEUROLOGIC A:   Acute metabolic encephalopathy Occasional jerking movements -resolved  P:   RASS goal: -1 Fentanyl and propofol infusions   FAMILY  - Updates: Mother and father updated 6/16   - Inter-disciplinary family meet or Palliative Care meeting due by:  6/20   My cc time x 8m  Kara Mead MD. [facility]  Pulmonary & Critical care Pager [phone] If no response call [phone]    09/11/2016 11:57 AM

## 2016-09-11 NOTE — Progress Notes (Signed)
PHARMACY - ADULT TOTAL PARENTERAL NUTRITION CONSULT NOTE   Pharmacy Consult:  TPN Indication:  SBO   Patient Measurements: Height: 5\' 5"  (165.1 cm) Weight: 154 lb 8.7 oz (70.1 kg) IBW/kg (Calculated) : 57 TPN AdjBW (KG): 60.7 Body mass index is 25.72 kg/m.  Weight at [place]:  64kg  Assessment:  43 YOF admitted to [place] on 09/06/16 with SBO and underwent emergent laparotomy for perforation on 09/07/16.  Post-operatively, patient developed ARDS and septic shock.  Patient received 25L of IVF prior to transfer to [facility] on 09/10/16.  Pharmacy consulted to initiate TPN for nutritional support while awaiting return of bowel function.  Unable to assess nutritional intake PTA.  GI: PPI IV Endo: no hx DM - CBGs controlled Insulin requirements in the past 24 hours: none Lytes: slightly elevated CL, low Phos and Mag (2mg  ordered), others WNL Renal: SCr 0.66, BUN WNL.  Received 25L at [facility], net -1L at [facility]. Pulm: COPD.  Intubated, FiO2 60% Cards: MAP 80s, off pressors, troponin up 0.64, CVP 8-13 - Lasix x 1 Hepatobil: LFTs WNL except mildly elevated AST at 58.  tbili / TG WNL Neuro: anxiety.  Fentanyl gtt, PRN Versed - GCS 12, CPOT 0, RASS at goal lof -2 ID: Merrem/Flagyl from PTA for peritonitis - afebrile, WBC down 15.9, PCT 13.61 Best Practices: heparin SQ, MC, PPI IV TPN Access: triple lumen R-IJ 09/10/16 TPN start date: 09/11/16  Nutritional Goals: 1600-1700 kCal and 95-115 gm protein per day  Current Nutrition:  NPO   Plan:  - Initiate Clinimix E 5/15 at 40 ml/hr - Hold ILE for the first 7 days of TPN per SCCM/ASPEN guidelines (start date 09/18/16) - Daily multivitamin in TPN - Trace elements in TPN every other day d/t shortage, start 6/17 - Continue sensitive SSI Q4H - NaPhos 10 mmol + KPhos 10 mmol - F/U AM labs   Deardra Hinkley D. Mina Marble, PharmD, BCPS Pager:  [phone] 09/11/2016, 1:17 PM

## 2016-09-11 NOTE — Progress Notes (Signed)
Initial Nutrition Assessment  DOCUMENTATION CODES:  Not applicable  INTERVENTION:  TPN per pharmacy- see kcal/pro recs below  Recommend transition to TF/Oral diet as soon as medically able  NUTRITION DIAGNOSIS:  Malnutrition (Severe) related to  Acute illness (SBO w/ perferation s/p SBR) as evidenced by severe fluid accumulation and an energy intake that has met < or equal to 50% of estimated needs for > or equal to 5 days.  GOAL:  Patient will meet greater than or equal to 90% of their needs  MONITOR:  Labs, Vent status, I & O's, Diet advancement, Skin, Weight trends  REASON FOR ASSESSMENT:  Consult New TPN/TNA  ASSESSMENT:  51 y/o female PMHx Cholecystitis, COPD, anxiety, anemia. Presented to [facility] 6/11 w/ abdominal pain, n/v. Worked up for SBO. Suffered bowel perf on 6/12 and taken emergently to OR for repair/SBR. Extubated post op, but had respiratory distress and was re intubated. Developed pulmonary infiltrates concerning for ARDS vs Pulmonary Edema. Likely septic shock due to peritonitis. Transferred to [facility]. RD consulted for Initiation of TPN.   RD reports that her abdomen is not truly open; is closed at fascia level. Was significantly weeping, but this shift, not as much, and bandages have not had to be changed.   Pt is intubated and sedated. No historians present.   Now has gone 5 days w/o nutrition. Awaiting bowel function return. TPN being initiated  Patient is currently intubated on ventilator support MV: 11.9  L/min Temp (24hrs), Avg:98.1 F (36.7 C), Min:97.6 F (36.4 C), Max:98.5 F (36.9 C)  Propofol: None  Labs: BG 100-120, WBC15.9, Phos 1.7, Mag 1.4, TG 139, MAP 75 Meds: Insulin, PPI, Fentanyl, IV abx, TPN   Recent Labs Lab 09/10/16 1344 09/11/16 0347  NA 132* 145  K 4.4 4.2  CL 108 112*  CO2 21* 27  BUN 10 15  CREATININE 0.62 0.66  CALCIUM 5.9* 7.7*  MG 1.3* 1.4*  PHOS 1.9* 1.7*  GLUCOSE 400* 105*   Diet Order:  Diet NPO time  specified TPN (CLINIMIX-E) Adult  Skin:  Surgical incision/weeping to abdomen (partially open),   Last BM:  Unknown  Height:  Ht Readings from Last 1 Encounters:  09/10/16 _0  (1.651 m)   Weight:  Wt Readings from Last 1 Encounters:  09/11/16 154 lb 8.7 oz (70.1 kg)  Weight in Paper chart from [place]: 65 kg  Ideal Body Weight:  56.82 kg  BMI:  Body mass index using weight from [place] is 23.8 kg/m.  Dosing weight: 65 kg Estimated Nutritional Needs:  Kcal:  1550 kcals (psu 2003 B) Protein:  110-130 g Pro (1.7-2g/kg bw) Fluid:  Per MD  EDUCATION NEEDS:  No education needs identified at this time  Burtis Junes RD, LDN, [place] [facility] Pager: [phone] 09/11/2016 3:30 PM

## 2016-09-11 NOTE — Progress Notes (Signed)
On arrival to patient's room, patient's ETT was at 20 at the lip rather than 21, patient's ETT advanced to 21 at the lip. Patient tolerated well. RN notified.

## 2016-09-12 ENCOUNTER — Inpatient Hospital Stay (HOSPITAL_COMMUNITY): Payer: BLUE CROSS/BLUE SHIELD

## 2016-09-12 LAB — DIFFERENTIAL
BASOS PCT: 0 %
Basophils Absolute: 0 10*3/uL (ref 0.0–0.1)
EOS ABS: 0 10*3/uL (ref 0.0–0.7)
Eosinophils Relative: 0 %
LYMPHS ABS: 1.6 10*3/uL (ref 0.7–4.0)
Lymphocytes Relative: 12 %
MONO ABS: 0.7 10*3/uL (ref 0.1–1.0)
Monocytes Relative: 5 %
NEUTROS ABS: 11.3 10*3/uL — AB (ref 1.7–7.7)
NEUTROS PCT: 83 %

## 2016-09-12 LAB — COMPREHENSIVE METABOLIC PANEL
ALK PHOS: 62 U/L (ref 38–126)
ALT: 28 U/L (ref 14–54)
ANION GAP: 6 (ref 5–15)
AST: 18 U/L (ref 15–41)
Albumin: 1.3 g/dL — ABNORMAL LOW (ref 3.5–5.0)
BUN: 29 mg/dL — ABNORMAL HIGH (ref 6–20)
CALCIUM: 7.5 mg/dL — AB (ref 8.9–10.3)
CO2: 28 mmol/L (ref 22–32)
Chloride: 113 mmol/L — ABNORMAL HIGH (ref 101–111)
Creatinine, Ser: 0.64 mg/dL (ref 0.44–1.00)
GFR calc non Af Amer: >60 mL/min (ref >60)
Glucose, Bld: 137 mg/dL — ABNORMAL HIGH (ref 65–99)
Potassium: 3.7 mmol/L (ref 3.5–5.1)
Sodium: 147 mmol/L — ABNORMAL HIGH (ref 135–145)
TOTAL PROTEIN: 3.7 g/dL — AB (ref 6.5–8.1)
Total Bilirubin: 0.3 mg/dL (ref 0.3–1.2)

## 2016-09-12 LAB — BLOOD GAS, ARTERIAL
Acid-Base Excess: 3.9 mmol/L — ABNORMAL HIGH (ref 0.0–2.0)
Bicarbonate: 28 mmol/L (ref 20.0–28.0)
DRAWN BY: 437071
FIO2: 50
MECHVT: 410 mL
O2 Saturation: 97.5 %
PEEP: 10 cmH2O
PO2 ART: 98.6 mmHg (ref 83.0–108.0)
Patient temperature: 98.6
RATE: 24 resp/min
pCO2 arterial: 43.3 mmHg (ref 32.0–48.0)
pH, Arterial: 7.427 (ref 7.350–7.450)

## 2016-09-12 LAB — GLUCOSE, CAPILLARY
GLUCOSE-CAPILLARY: 127 mg/dL — AB (ref 65–99)
GLUCOSE-CAPILLARY: 127 mg/dL — AB (ref 65–99)
GLUCOSE-CAPILLARY: 144 mg/dL — AB (ref 65–99)
GLUCOSE-CAPILLARY: 154 mg/dL — AB (ref 65–99)
Glucose-Capillary: 145 mg/dL — ABNORMAL HIGH (ref 65–99)
Glucose-Capillary: 194 mg/dL — ABNORMAL HIGH (ref 65–99)
Glucose-Capillary: 80 mg/dL (ref 65–99)

## 2016-09-12 LAB — CBC
HCT: 26.7 % — ABNORMAL LOW (ref 36.0–46.0)
Hemoglobin: 8.5 g/dL — ABNORMAL LOW (ref 12.0–15.0)
MCH: 25.2 pg — AB (ref 26.0–34.0)
MCHC: 31.8 g/dL (ref 30.0–36.0)
MCV: 79.2 fL (ref 78.0–100.0)
PLATELETS: 190 10*3/uL (ref 150–400)
RBC: 3.37 MIL/uL — AB (ref 3.87–5.11)
RDW: 17.2 % — AB (ref 11.5–15.5)
WBC: 13.6 10*3/uL — ABNORMAL HIGH (ref 4.0–10.5)

## 2016-09-12 LAB — TRIGLYCERIDES: Triglycerides: 207 mg/dL — ABNORMAL HIGH (ref <150)

## 2016-09-12 LAB — PREALBUMIN: PREALBUMIN: 6.2 mg/dL — AB (ref 18–38)

## 2016-09-12 LAB — PHOSPHORUS: PHOSPHORUS: 3.2 mg/dL (ref 2.5–4.6)

## 2016-09-12 LAB — MAGNESIUM: MAGNESIUM: 1.7 mg/dL (ref 1.7–2.4)

## 2016-09-12 MED ORDER — NOREPINEPHRINE BITARTRATE 1 MG/ML IV SOLN
0.0000 ug/min | INTRAVENOUS | Status: DC
  Administered 2016-09-12: 2 ug/min via INTRAVENOUS
  Filled 2016-09-12 (×2): qty 4

## 2016-09-12 MED ORDER — DEXMEDETOMIDINE HCL 200 MCG/2ML IV SOLN
0.4000 ug/kg/h | INTRAVENOUS | Status: DC
  Administered 2016-09-12: 0.4 ug/kg/h via INTRAVENOUS
  Filled 2016-09-12 (×2): qty 2

## 2016-09-12 MED ORDER — DEXMEDETOMIDINE HCL IN NACL 400 MCG/100ML IV SOLN
0.4000 ug/kg/h | INTRAVENOUS | Status: DC
  Administered 2016-09-12: 0.4 ug/kg/h via INTRAVENOUS
  Filled 2016-09-12: qty 100

## 2016-09-12 MED ORDER — FUROSEMIDE 10 MG/ML IJ SOLN
40.0000 mg | Freq: Two times a day (BID) | INTRAMUSCULAR | Status: AC
  Administered 2016-09-12 – 2016-09-16 (×10): 40 mg via INTRAVENOUS
  Filled 2016-09-12 (×11): qty 4

## 2016-09-12 MED ORDER — MAGNESIUM SULFATE 2 GM/50ML IV SOLN
2.0000 g | Freq: Once | INTRAVENOUS | Status: AC
  Administered 2016-09-12: 2 g via INTRAVENOUS
  Filled 2016-09-12: qty 50

## 2016-09-12 MED ORDER — SODIUM CHLORIDE 0.9 % IV SOLN
30.0000 meq | Freq: Once | INTRAVENOUS | Status: AC
  Administered 2016-09-12: 30 meq via INTRAVENOUS
  Filled 2016-09-12: qty 15

## 2016-09-12 MED ORDER — TRACE MINERALS CR-CU-MN-SE-ZN 10-1000-500-60 MCG/ML IV SOLN
INTRAVENOUS | Status: AC
  Administered 2016-09-12: 18:00:00 via INTRAVENOUS
  Filled 2016-09-12: qty 1992

## 2016-09-12 MED ORDER — SODIUM CHLORIDE 0.9 % IV SOLN
2.0000 mg/h | INTRAVENOUS | Status: DC
  Administered 2016-09-12: 1 mg/h via INTRAVENOUS
  Filled 2016-09-12 (×2): qty 10

## 2016-09-12 NOTE — Plan of Care (Signed)
Problem: Activity: Goal: Ability to tolerate increased activity will improve Outcome: Not Progressing Pt remains intubated and sedated  Problem: Coping: Goal: Level of anxiety will decrease Outcome: Not Progressing Still requiring sedation  Problem: Nutritional: Goal: Intake of prescribed amount of daily calories will improve Outcome: Progressing TPN started 09/11/2016

## 2016-09-12 NOTE — Progress Notes (Signed)
PHARMACY - ADULT TOTAL PARENTERAL NUTRITION CONSULT NOTE   Pharmacy Consult:  TPN Indication:  SBO   Patient Measurements: Height: 5\' 5"  (165.1 cm) Weight: 152 lb 1.9 oz (69 kg) IBW/kg (Calculated) : 57 TPN AdjBW (KG): 60.7 Body mass index is 25.31 kg/m.  Weight at [facility]: 64 kg  Assessment:  27 YOF admitted to [place] on 09/06/16 with SBO and underwent emergent laparotomy for perforation on 09/07/16.  Post-operatively, patient developed ARDS and septic shock.  Patient received 25L of IVF prior to transfer to [facility] on 09/10/16.  Pharmacy consulted to manage TPN for nutritional support while awaiting return of bowel function.  Unable to assess nutritional intake PTA; however, patient has severe malnutrition per RD assessment.  GI: baseline prealbumin low at 6.2, NG O/P 146mL - PPI IV Endo: no hx DM - CBGs trended up with TPN but remain controlled Insulin requirements in the past 24 hours: 4 units since TPN started Lytes: Na/CL slightly elevated, others WNL Renal: SCr 0.64, BUN WNL.  Received 25L at [facility], UOP 1644mL, net -962mL at [facility]. Pulm: COPD.  Intubated, FiO2 down to 50% Cards: EF 50-55%, VSS off pressor, troponin up 0.64 - Lasix PRN Hepatobil: LFTs / tbili / TG WNL Neuro: anxiety.  Fentanyl gtt, PRN Versed - GCS 10, CPOT 1, RASS 3 (goal -2) ID: Merrem/Flagyl from PTA for peritonitis - afebrile, WBC down to 13.6, PCT 13.61, TA GNR and yeast on Gram stain Best Practices: heparin SQ, MC, PPI IV TPN Access: triple lumen R-IJ 09/10/16 TPN start date: 09/11/16  Nutritional Goals (per RD rec 6/16): 1500-1600 kCal and 110-130 gm protein per day  Current Nutrition:  TPN   Plan:  - Increase Clinimix E 5/15 to 83 ml/hr.  TPN will provide 1414 kCal and 100gm of protein per day, meeting > 90% of total need. - Hold ILE for the first 7 days of TPN per SCCM/ASPEN guidelines (start date 09/18/16) - Daily multivitamin in TPN - Trace elements in TPN every other day d/t shortage, next dose  6/17 - Continue sensitive SSI Q4H - Repeat Mag sulfate 2 gm IV x 1 - KCL 65mEq IV x 1 - Watch CBGs, Na/CL - F/U AM labs   Nur Rabold D. Mina Marble, PharmD, BCPS Pager:  [phone] 09/12/2016, 7:11 AM

## 2016-09-12 NOTE — Progress Notes (Signed)
CCS/Lizandra Zakrzewski Progress Note    Subjective: Patient is awake and responsive on the venitlator.  No having any abdominal pain.  Objective: Vital signs in last 24 hours: Temp:  [97.6 F (36.4 C)-98.8 F (37.1 C)] 98.8 F (37.1 C) (06/17 0000) Pulse Rate:  [47-92] 82 (06/17 0700) Resp:  [0-35] 0 (06/17 0700) BP: (92-125)/(59-78) 95/60 (06/17 0700) SpO2:  [90 %-98 %] 95 % (06/17 0700) Arterial Line BP: (67-140)/(52-81) 73/64 (06/17 0700) FiO2 (%):  [50 %] 50 % (06/17 0329) Weight:  [69 kg (152 lb 1.9 oz)] 69 kg (152 lb 1.9 oz) (06/17 0500) Last BM Date:  (PTA)  Intake/Output from previous day: 06/16 0701 - 06/17 0700 In: 2084.8 [I.V.:1044.8; NG/GT:90; IV Piggyback:950] Out: 1880 [Urine:1730; Emesis/NG output:150] Intake/Output this shift: No intake/output data recorded.  General: No distress.  Lungs: Clear to auscultation.  Oxygen saturations only 90% on FIO2 50%, PEEP 5, but ABG this Am good  Abd: Distended without bowel sounds.  Wound is pale and with little granulations tissue.  Extremities: No changes  Neuro: Intact  Lab Results:  @LABLAST2 (wbc:2,hgb:2,hct:2,plt:2) BMET ) Recent Labs  09/11/16 0347 09/12/16 0330  NA 145 147*  K 4.2 3.7  CL 112* 113*  CO2 27 28  GLUCOSE 105* 137*  BUN 15 29*  CREATININE 0.66 0.64  CALCIUM 7.7* 7.5*   PT/INR  Recent Labs  09/10/16 1344  LABPROT 19.8*  INR 1.65   ABG  Recent Labs  09/11/16 1740 09/12/16 0422  PHART 7.445 7.427  HCO3 29.5* 28.0    Studies/Results: Dg Chest Port 1 View  Result Date: 09/12/2016 CLINICAL DATA:  Respiratory failure. EXAM: PORTABLE CHEST 1 VIEW COMPARISON:  09/11/2016 FINDINGS: Endotracheal tube is in place with tip approximately 3 cm above the carina. Nasogastric tube is in place with tip off the film beyond the gastroesophageal junction. Right IJ central line tip overlies the level of superior vena cava. Heart size is normal. There is significant opacity at the left lung base obscuring  the hemidiaphragm. Minimal right lower lobe atelectasis. There is mild interstitial pulmonary edema. The appearance is stable. IMPRESSION: Persistent significant left lower lobe opacity and minimal right lower lobe atelectasis. Interstitial pulmonary edema. Electronically Signed   By: Nolon Nations M.D.   On: 09/12/2016 08:02   Dg Chest Port 1 View  Result Date: 09/11/2016 CLINICAL DATA:  Lung infiltrates EXAM: PORTABLE CHEST 1 VIEW COMPARISON:  09/10/2016 FINDINGS: Endotracheal tube in satisfactory position. Right jugular central venous catheter tip in the proximal SVC unchanged Diffuse bilateral airspace disease unchanged. This is most prominent in the left lower lobe. Probable small pleural effusions. IMPRESSION: Support lines unchanged in satisfactory position Diffuse bilateral airspace disease with basilar predominance is stable. Electronically Signed   By: Franchot Gallo M.D.   On: 09/11/2016 07:27   Dg Chest Port 1 View  Result Date: 09/10/2016 CLINICAL DATA:  Central line placement EXAM: PORTABLE CHEST 1 VIEW COMPARISON:  09/10/2016 FINDINGS: Right IJ central line catheter is noted without associated pneumothorax. The tip projects over the expected location of the proximal SVC. An endotracheal tube tip is seen 5.5 cm above the carina. A gastric tube extends below the left hemidiaphragm though the tip is excluded on this study. Heart is borderline enlarged with diffuse scattered ground-glass opacities which may reflect sequela of mild pulmonary edema. Retrocardiac opacity and consolidation persists with small left effusion. IMPRESSION: 1. Satisfactory appearing support line and tube positions. 2. Diffuse ground-glass opacities which may reflect sequela pulmonary edema or atypical  pneumonia. Persistent retrocardiac consolidation and probable small left pleural effusion. Electronically Signed   By: Ashley Royalty M.D.   On: 09/10/2016 15:18    Anti-infectives: Anti-infectives    Start     Dose/Rate  Route Frequency Ordered Stop   09/11/16 1300  metroNIDAZOLE (FLAGYL) IVPB 500 mg     500 mg 100 mL/hr over 60 Minutes Intravenous Every 8 hours 09/11/16 1208     09/11/16 1300  meropenem (MERREM) 1 g in sodium chloride 0.9 % 100 mL IVPB     1 g 200 mL/hr over 30 Minutes Intravenous Every 8 hours 09/11/16 1259        Assessment/Plan: s/p  Not time to consider tube feedings.  Keep NGT on suction.  Dressing changes  LOS: 2 days   Kathryne Eriksson. Dahlia Bailiff, MD, FACS [phone] [facility] Surgery 09/12/2016

## 2016-09-12 NOTE — Progress Notes (Signed)
eLink Physician-Brief Progress Note Patient Name: Brandi Leonard DOB: February 16, 1966 MRN: [id number]   Date of Service  09/12/2016  HPI/Events of Note  Pt agitated on precedex  eICU Interventions  D/c precedex, start versed , cont fentanyl May need levophed     Intervention Category Major Interventions: Delirium, psychosis, severe agitation - evaluation and management  Asencion Noble 09/12/2016, 8:17 PM

## 2016-09-12 NOTE — Plan of Care (Signed)
Problem: Activity: Goal: Ability to tolerate increased activity will improve Outcome: Not Progressing Pt remains intubated and sedated  Problem: Coping: Goal: Level of anxiety will decrease Outcome: Not Progressing Sedation needs increasing.  Problem: Nutritional: Goal: Intake of prescribed amount of daily calories will improve Outcome: Progressing TPN going

## 2016-09-12 NOTE — Progress Notes (Signed)
PULMONARY / CRITICAL CARE MEDICINE   Name: Brandi Leonard MRN: [id number] DOB: 05-02-1965    ADMISSION DATE:  09/10/2016 CONSULTATION DATE:  09/10/2016  REFERRING MD:  Dr. [facility]  CHIEF COMPLAINT:  shock  HISTORY OF PRESENT ILLNESS:   51 year old admitted to [facility] 6/11 with Small bowel obstruction, underwent emergent laparotomy on 6/12 for perforation with peritonitis. Failed extubation postoperatively and then developed diffuse bilateral infiltrates consistent with ARDS and septic shock requiring Levophed  STUDIES:  CT abdomen 6/11 > multiple dilated loops of fluid filled small bowel consistent with mechanical small bowel obstruction Echo 6/16 >> nml LV fn, mild apical pericardial effusion  CULTURES: Blood 6/12 > Coag neg staph 1/2 bottles  ANTIBIOTICS: Primaxin 6/12 > Levaquin 6/12 >> 6/12 Flagyl 6/12 >  SIGNIFICANT EVENTS: 6/11 admit for SBO 6/12 perf to OR for repair 6/13 contnue shock, resp failure 6/15 transfer to [facility] for ICU admit. 6/16 started diuresis  LINES/TUBES: ETT 6/13 > R fem CVL 6/12 >6/15 Art line rt radial 6/15 > IJ CVL 6/15   SUBJECTIVE:  Remains critically ill on vent, Off pressors More awake on high dose fent gtt, RASS 0   VITAL SIGNS: BP 123/65   Pulse 61   Temp 98.2 F (36.8 C) (Oral)   Resp (!) 22   Ht 5\' 5"  (1.651 m)   Wt 152 lb 1.9 oz (69 kg)   SpO2 (!) 89%   BMI 25.31 kg/m   HEMODYNAMICS: CVP:  [6 mmHg-14 mmHg] 7 mmHg  VENTILATOR SETTINGS: Vent Mode: PRVC FiO2 (%):  [50 %] 50 % Set Rate:  [24 bmp-30 bmp] 24 bmp Vt Set:  [410 mL] 410 mL PEEP:  [5 cmH20-10 cmH20] 10 cmH20 Plateau Pressure:  [21 cmH20-24 cmH20] 22 cmH20  INTAKE / OUTPUT: I/O last 3 completed shifts: In: 2446.8 [I.V.:1266.8; NG/GT:120; IV Piggyback:1060] Out: 7026 [Urine:3010; Emesis/NG output:300]  PHYSICAL EXAMINATION: General:  Adult female on vent  Neuro:  calmer , follows commands , non focal HEENT:  [place]/AT, PERRL, no  appreciable JVD Cardiovascular:  RRR, no MRG. + edema to peripheries Lungs:  Coarse bilateral breath sounds Abdomen:  Midline longitudinal incision. Open abdomen.  Musculoskeletal:  No acute deformity Skin: Grossly intact with exception of surgical wound, ana sarca  LABS:  BMET  Recent Labs Lab 09/10/16 1344 09/11/16 0347 09/12/16 0330  NA 132* 145 147*  K 4.4 4.2 3.7  CL 108 112* 113*  CO2 21* 27 28  BUN 10 15 29*  CREATININE 0.62 0.66 0.64  GLUCOSE 400* 105* 137*    Electrolytes  Recent Labs Lab 09/10/16 1344 09/11/16 0347 09/12/16 0330  CALCIUM 5.9* 7.7* 7.5*  MG 1.3* 1.4* 1.7  PHOS 1.9* 1.7* 3.2    CBC  Recent Labs Lab 09/10/16 1344 09/10/16 2005 09/11/16 0347 09/12/16 0330  WBC 17.8*  --  15.9* 13.6*  HGB 6.9* 9.4* 8.7* 8.5*  HCT 22.5* 30.5* 27.7* 26.7*  PLT 202  --  167 190    Coag's  Recent Labs Lab 09/10/16 1344  INR 1.65    Sepsis Markers  Recent Labs Lab 09/10/16 1344  PROCALCITON 13.61    ABG  Recent Labs Lab 09/11/16 1607 09/11/16 1740 09/12/16 0422  PHART 7.506* 7.445 7.427  PCO2ART 34.9 42.9 43.3  PO2ART 58.0* 82.0* 98.6    Liver Enzymes  Recent Labs Lab 09/10/16 1344 09/12/16 0330  AST 58* 18  ALT 41 28  ALKPHOS 87 62  BILITOT <0.1* 0.3  ALBUMIN 1.5* 1.3*  Cardiac Enzymes  Recent Labs Lab 09/10/16 1344 09/10/16 2005 09/11/16 0347  TROPONINI 1.00* 0.41* 0.64*    Glucose  Recent Labs Lab 09/11/16 1222 09/11/16 1611 09/11/16 1956 09/11/16 2359 09/12/16 0429 09/12/16 0821  GLUCAP 99 92 124* 154* 127* 145*    Imaging Dg Chest Port 1 View  Result Date: 09/12/2016 CLINICAL DATA:  Respiratory failure. EXAM: PORTABLE CHEST 1 VIEW COMPARISON:  09/11/2016 FINDINGS: Endotracheal tube is in place with tip approximately 3 cm above the carina. Nasogastric tube is in place with tip off the film beyond the gastroesophageal junction. Right IJ central line tip overlies the level of superior vena cava.  Heart size is normal. There is significant opacity at the left lung base obscuring the hemidiaphragm. Minimal right lower lobe atelectasis. There is mild interstitial pulmonary edema. The appearance is stable. IMPRESSION: Persistent significant left lower lobe opacity and minimal right lower lobe atelectasis. Interstitial pulmonary edema. Electronically Signed   By: Nolon Nations M.D.   On: 09/12/2016 08:02      DISCUSSION:  Shock -resolved, ARDS - improving, needs diuresis  ASSESSMENT / PLAN:  PULMONARY A: Acute hypoxemic respiratory failure ARDS Pulmonary edema: volume overload , left > R pleural effusion COPD without acute exacerbation  P:   Vent settings reviewed & adjusted - drop RR 24 PEEP to 5 Start SBTs PRN albuterol Diurese  CARDIOVASCULAR A:  Shock: likely septic due to peritonitis. Demand ischemia -trop peak at 1.0 H/o HLD  P:  Telemetry monitoring Off pressors    RENAL A:   Hypokalemia  Hypomag  P:   Monitor lytes daily & replace as needed Strict I&O Lasix 40  q 12h, give potassium in anticipation  GASTROINTESTINAL A:   SBO complicated by perforation now s/p repair 6/12 Severe protein cal malnutrition  P:   NPO OGT to LIS ct TPN - while await return of bowel function Wound care per surgery  HEMATOLOGIC A:   Anemia  P:  CBC, coags, type and screen.  Subcutaneous heparin  INFECTIOUS A:   Septic shock secondary to peritonitis  P:   Continue meropenem & flagyl Plan 7ds, then per pct  ENDOCRINE A:   Relative Adrenal insufficiency (cortisol 12 at )   P:   dc stress dose steroids CBG monitoring and SSI  NEUROLOGIC A:   Acute metabolic encephalopathy Occasional jerking movements -resolved  P:   RASS goal: -1 Fentanyl gtt, add precedex gtt   FAMILY  - Updates: Mother and father updated daily    - Inter-disciplinary family meet or Palliative Care meeting due by:  6/20   My cc time x 71m  Kara Mead MD.  [facility]. [place] [facility] Pager [phone] If no response call [phone]    09/12/2016 11:36 AM

## 2016-09-13 ENCOUNTER — Inpatient Hospital Stay (HOSPITAL_COMMUNITY): Payer: BLUE CROSS/BLUE SHIELD

## 2016-09-13 DIAGNOSIS — R6521 Severe sepsis with septic shock: Secondary | ICD-10-CM

## 2016-09-13 DIAGNOSIS — A419 Sepsis, unspecified organism: Principal | ICD-10-CM

## 2016-09-13 DIAGNOSIS — J9601 Acute respiratory failure with hypoxia: Secondary | ICD-10-CM

## 2016-09-13 LAB — COMPREHENSIVE METABOLIC PANEL
ALK PHOS: 57 U/L (ref 38–126)
ALT: 22 U/L (ref 14–54)
ANION GAP: 8 (ref 5–15)
AST: 18 U/L (ref 15–41)
Albumin: 1.3 g/dL — ABNORMAL LOW (ref 3.5–5.0)
BUN: 21 mg/dL — ABNORMAL HIGH (ref 6–20)
CALCIUM: 7.1 mg/dL — AB (ref 8.9–10.3)
CO2: 32 mmol/L (ref 22–32)
CREATININE: 0.53 mg/dL (ref 0.44–1.00)
Chloride: 108 mmol/L (ref 101–111)
Glucose, Bld: 194 mg/dL — ABNORMAL HIGH (ref 65–99)
Potassium: 2.9 mmol/L — ABNORMAL LOW (ref 3.5–5.1)
SODIUM: 148 mmol/L — AB (ref 135–145)
TOTAL PROTEIN: 3.7 g/dL — AB (ref 6.5–8.1)
Total Bilirubin: 0.4 mg/dL (ref 0.3–1.2)

## 2016-09-13 LAB — CULTURE, RESPIRATORY W GRAM STAIN

## 2016-09-13 LAB — GLUCOSE, CAPILLARY
GLUCOSE-CAPILLARY: 177 mg/dL — AB (ref 65–99)
Glucose-Capillary: 153 mg/dL — ABNORMAL HIGH (ref 65–99)
Glucose-Capillary: 187 mg/dL — ABNORMAL HIGH (ref 65–99)
Glucose-Capillary: 179 mg/dL — ABNORMAL HIGH (ref 65–99)
Glucose-Capillary: 162 mg/dL — ABNORMAL HIGH (ref 65–99)
Glucose-Capillary: 184 mg/dL — ABNORMAL HIGH (ref 65–99)

## 2016-09-13 LAB — DIFFERENTIAL
BASOS ABS: 0 10*3/uL (ref 0.0–0.1)
Basophils Relative: 0 %
EOS ABS: 0 10*3/uL (ref 0.0–0.7)
Eosinophils Relative: 0 %
LYMPHS ABS: 2.4 10*3/uL (ref 0.7–4.0)
Lymphocytes Relative: 13 %
MONO ABS: 0.9 10*3/uL (ref 0.1–1.0)
MONOS PCT: 5 %
NEUTROS ABS: 15.3 10*3/uL — AB (ref 1.7–7.7)
NEUTROS PCT: 82 %

## 2016-09-13 LAB — CBC
HEMATOCRIT: 32.8 % — AB (ref 36.0–46.0)
Hemoglobin: 10.4 g/dL — ABNORMAL LOW (ref 12.0–15.0)
MCH: 25.4 pg — ABNORMAL LOW (ref 26.0–34.0)
MCHC: 31.7 g/dL (ref 30.0–36.0)
MCV: 80.2 fL (ref 78.0–100.0)
PLATELETS: 195 10*3/uL (ref 150–400)
RBC: 4.09 MIL/uL (ref 3.87–5.11)
RDW: 17.2 % — AB (ref 11.5–15.5)
WBC: 18.6 10*3/uL — AB (ref 4.0–10.5)

## 2016-09-13 LAB — PHOSPHORUS: PHOSPHORUS: 3.5 mg/dL (ref 2.5–4.6)

## 2016-09-13 LAB — POCT I-STAT 3, ART BLOOD GAS (G3+)
ACID-BASE EXCESS: 10 mmol/L — AB (ref 0.0–2.0)
Bicarbonate: 34.3 mmol/L — ABNORMAL HIGH (ref 20.0–28.0)
O2 SAT: 95 %
PO2 ART: 70 mmHg — AB (ref 83.0–108.0)
TCO2: 36 mmol/L (ref 0–100)
pCO2 arterial: 42.7 mmHg (ref 32.0–48.0)
pH, Arterial: 7.514 — ABNORMAL HIGH (ref 7.350–7.450)

## 2016-09-13 LAB — BASIC METABOLIC PANEL
Anion gap: 6 (ref 5–15)
BUN: 21 mg/dL — AB (ref 6–20)
CHLORIDE: 107 mmol/L (ref 101–111)
CO2: 32 mmol/L (ref 22–32)
CREATININE: 0.36 mg/dL — AB (ref 0.44–1.00)
Calcium: 7.2 mg/dL — ABNORMAL LOW (ref 8.9–10.3)
GFR calc non Af Amer: >60 mL/min (ref >60)
Glucose, Bld: 173 mg/dL — ABNORMAL HIGH (ref 65–99)
Potassium: 3.5 mmol/L (ref 3.5–5.1)
SODIUM: 145 mmol/L (ref 135–145)

## 2016-09-13 LAB — MAGNESIUM
MAGNESIUM: 1.3 mg/dL — AB (ref 1.7–2.4)
MAGNESIUM: 1.8 mg/dL (ref 1.7–2.4)

## 2016-09-13 LAB — TRIGLYCERIDES: TRIGLYCERIDES: 168 mg/dL — AB (ref <150)

## 2016-09-13 LAB — PREALBUMIN: PREALBUMIN: 8.9 mg/dL — AB (ref 18–38)

## 2016-09-13 LAB — CULTURE, RESPIRATORY

## 2016-09-13 MED ORDER — POTASSIUM CHLORIDE 10 MEQ/50ML IV SOLN
10.0000 meq | INTRAVENOUS | Status: AC
  Administered 2016-09-13 (×8): 10 meq via INTRAVENOUS
  Filled 2016-09-13 (×8): qty 50

## 2016-09-13 MED ORDER — POTASSIUM CHLORIDE 10 MEQ/50ML IV SOLN
10.0000 meq | INTRAVENOUS | Status: AC
  Administered 2016-09-13 – 2016-09-14 (×4): 10 meq via INTRAVENOUS
  Filled 2016-09-13 (×4): qty 50

## 2016-09-13 MED ORDER — M.V.I. ADULT IV INJ
INJECTION | INTRAVENOUS | Status: AC
  Administered 2016-09-13: 18:00:00 via INTRAVENOUS
  Filled 2016-09-13: qty 1992

## 2016-09-13 MED ORDER — SODIUM CHLORIDE 0.9 % IV SOLN
6.0000 g | Freq: Once | INTRAVENOUS | Status: AC
  Administered 2016-09-13: 6 g via INTRAVENOUS
  Filled 2016-09-13: qty 12

## 2016-09-13 MED ORDER — INSULIN ASPART 100 UNIT/ML ~~LOC~~ SOLN
0.0000 [IU] | SUBCUTANEOUS | Status: DC
  Administered 2016-09-13 (×4): 3 [IU] via SUBCUTANEOUS
  Administered 2016-09-14: 5 [IU] via SUBCUTANEOUS
  Administered 2016-09-14 (×3): 3 [IU] via SUBCUTANEOUS
  Administered 2016-09-14: 5 [IU] via SUBCUTANEOUS
  Administered 2016-09-14: 3 [IU] via SUBCUTANEOUS
  Administered 2016-09-15 (×3): 2 [IU] via SUBCUTANEOUS
  Administered 2016-09-15 (×2): 5 [IU] via SUBCUTANEOUS
  Administered 2016-09-15 – 2016-09-16 (×5): 3 [IU] via SUBCUTANEOUS
  Administered 2016-09-16: 2 [IU] via SUBCUTANEOUS
  Administered 2016-09-16 – 2016-09-17 (×2): 3 [IU] via SUBCUTANEOUS
  Administered 2016-09-17: 2 [IU] via SUBCUTANEOUS
  Administered 2016-09-17: 3 [IU] via SUBCUTANEOUS
  Administered 2016-09-17 (×2): 2 [IU] via SUBCUTANEOUS
  Administered 2016-09-17: 3 [IU] via SUBCUTANEOUS
  Administered 2016-09-18: 2 [IU] via SUBCUTANEOUS
  Administered 2016-09-18: 3 [IU] via SUBCUTANEOUS
  Administered 2016-09-18: 2 [IU] via SUBCUTANEOUS
  Administered 2016-09-18 (×2): 3 [IU] via SUBCUTANEOUS
  Administered 2016-09-18: 2 [IU] via SUBCUTANEOUS
  Administered 2016-09-18 – 2016-09-19 (×2): 3 [IU] via SUBCUTANEOUS

## 2016-09-13 NOTE — Progress Notes (Signed)
PULMONARY / CRITICAL CARE MEDICINE   Name: Brandi Leonard MRN: [id number] DOB: 1965/07/25    ADMISSION DATE:  09/10/2016 CONSULTATION DATE:  09/10/2016  REFERRING MD:  Dr. [facility]  CHIEF COMPLAINT:  shock  HISTORY OF PRESENT ILLNESS:   51 year old admitted to [facility] 6/11 with Small bowel obstruction, underwent emergent laparotomy on 6/12 for perforation with peritonitis. Failed extubation postoperatively and then developed diffuse bilateral infiltrates consistent with ARDS and septic shock requiring Levophed  STUDIES:  CT abdomen 6/11 > multiple dilated loops of fluid filled small bowel consistent with mechanical small bowel obstruction Echo 6/16 >> nml LV fn, mild apical pericardial effusion  CULTURES: Blood 6/12 > Coag neg staph 1/2 bottles  ANTIBIOTICS: Primaxin 6/12 > Levaquin 6/12 >> 6/12 Flagyl 6/12 >  SIGNIFICANT EVENTS: 6/11 admit for SBO 6/12 perf to OR for repair 6/13 contnue shock, resp failure 6/15 transfer to [facility] for ICU admit. 6/16 started diuresis  LINES/TUBES: ETT 6/13 > R fem CVL 6/12 >6/15 Art line rt radial 6/15 > IJ CVL 6/15   SUBJECTIVE:  Weaning sedation.  Tolerated PS 15/5 x1 hour but then desat to 80's, bradycardic.    VITAL SIGNS: BP 118/85   Pulse 79   Temp 98.6 F (37 C) (Axillary)   Resp 17   Ht 5\' 5"  (1.651 m)   Wt 68 kg (149 lb 14.6 oz)   SpO2 90%   BMI 24.95 kg/m   HEMODYNAMICS: CVP:  [12 mmHg] 12 mmHg  VENTILATOR SETTINGS: Vent Mode: PRVC FiO2 (%):  [40 %-50 %] 50 % Set Rate:  [18 bmp-24 bmp] 18 bmp Vt Set:  [410 mL-460 mL] 460 mL PEEP:  [5 cmH20-10 cmH20] 10 cmH20 Pressure Support:  [10 cmH20] 10 cmH20 Plateau Pressure:  [15 cmH20-21 cmH20] 20 cmH20  INTAKE / OUTPUT: I/O last 3 completed shifts: In: 3534.3 [I.V.:3024.3; NG/GT:60; IV Piggyback:450] Out: 5830 [Urine:5130; Emesis/NG output:700]  PHYSICAL EXAMINATION: General:   Acutely ill appearing female, NAD  HEENT: MM pink/moist,  ETT Neuro:  Awake, nods appropriately, follows commands  CV: s1s2 rrr, no m/r/g PULM: even/non-labored on vent, scattered crackles, diminished bases  GI: large surgical wound, soft, mildly tender, -bs Extremities: warm/dry, anascarca  Skin: no rashes or lesions   LABS:  BMET  Recent Labs Lab 09/11/16 0347 09/12/16 0330 09/13/16 0322  NA 145 147* 148*  K 4.2 3.7 2.9*  CL 112* 113* 108  CO2 27 28 32  BUN 15 29* 21*  CREATININE 0.66 0.64 0.53  GLUCOSE 105* 137* 194*    Electrolytes  Recent Labs Lab 09/11/16 0347 09/12/16 0330 09/13/16 0322  CALCIUM 7.7* 7.5* 7.1*  MG 1.4* 1.7 1.3*  PHOS 1.7* 3.2 3.5    CBC  Recent Labs Lab 09/11/16 0347 09/12/16 0330 09/13/16 0322  WBC 15.9* 13.6* 18.6*  HGB 8.7* 8.5* 10.4*  HCT 27.7* 26.7* 32.8*  PLT 167 190 195    Coag's  Recent Labs Lab 09/10/16 1344  INR 1.65    Sepsis Markers  Recent Labs Lab 09/10/16 1344  PROCALCITON 13.61    ABG  Recent Labs Lab 09/11/16 1740 09/12/16 0422 09/13/16 0354  PHART 7.445 7.427 7.514*  PCO2ART 42.9 43.3 42.7  PO2ART 82.0* 98.6 70.0*    Liver Enzymes  Recent Labs Lab 09/10/16 1344 09/12/16 0330 09/13/16 0322  AST 58* 18 18  ALT 41 28 22  ALKPHOS 87 62 57  BILITOT <0.1* 0.3 0.4  ALBUMIN 1.5* 1.3* 1.3*    Cardiac Enzymes  Recent  Labs Lab 09/10/16 1344 09/10/16 2005 09/11/16 0347  TROPONINI 1.00* 0.41* 0.64*    Glucose  Recent Labs Lab 09/12/16 0821 09/12/16 1242 09/12/16 1639 09/12/16 1937 09/12/16 2344 09/13/16 0503  GLUCAP 145* 144* 127* 80 194* 177*    Imaging Dg Chest Port 1 View  Result Date: 09/13/2016 CLINICAL DATA:  Intubation. EXAM: PORTABLE CHEST 1 VIEW COMPARISON:  09/12/2016. FINDINGS: Endotracheal tube, NG tube, right IJ line stable position. Heart size normal. Persistent bilateral interstitial infiltrates/edema, left side greater than right. No change from prior exam. Persistent basilar atelectasis. Small left pleural  effusion cannot be excluded. No pneumothorax . IMPRESSION: 1. Lines and tubes in stable position. 2. Persistent bilateral pulmonary interstitial infiltrates/edema, left side greater than right. No change from prior exam . Persistent bibasilar atelectasis. Small left pleural effusion cannot be excluded . Electronically Signed   By: Marcello Moores  Register   On: 09/13/2016 07:39      DISCUSSION:  Shock -resolved, ARDS - improving, needs diuresis  ASSESSMENT / PLAN:  PULMONARY A: Acute hypoxemic respiratory failure ARDS Pulmonary edema: volume overload , left > R pleural effusion COPD without acute exacerbation  P:   Vent support - 8cc/kg  F/u CXR  F/u ABG Daily SBT, attempts at PS wean  Needs aggressive diuresis  PRN albuterol Decrease RR 18  CARDIOVASCULAR A:  Shock: likely septic due to peritonitis. Demand ischemia -trop peak at 1.0 H/o HLD  P:  Telemetry monitoring Off pressors Diuresis as above    RENAL A:   Hypokalemia  Hypomag  P:   Monitor lytes daily & replace as needed Strict I&O Lasix 40  q 12h Replete K, Mg   GASTROINTESTINAL A:   SBO complicated by perforation now s/p repair 6/12 Severe protein cal malnutrition  P:   NPO OGT to LIS ct TPN - while await return of bowel function Wound care per surgery - BID  HEMATOLOGIC A:   Anemia  P:  CBC, coags, type and screen.  Subcutaneous heparin  INFECTIOUS A:   Septic shock secondary to peritonitis  P:   D6 meropenem & flagyl - plan 7 days for now  ENDOCRINE A:   Relative Adrenal insufficiency (cortisol 12 at [place])   P:   Stress steroids off  CBG monitoring and SSI  NEUROLOGIC A:   Acute metabolic encephalopathy Occasional jerking movements -resolved  P:   RASS goal: -1 Fentanyl gtt   FAMILY  - Updates: no family at bedside 6/18   - Inter-disciplinary family meet or Palliative Care meeting due by:  6/20   Nickolas Madrid, NP 09/13/2016  11:12 AM Pager: [phone] or  [phone]

## 2016-09-13 NOTE — Progress Notes (Signed)
PHARMACY - ADULT TOTAL PARENTERAL NUTRITION CONSULT NOTE   Pharmacy Consult:  TPN Indication:  SBO   Patient Measurements: Height: 5\' 5"  (165.1 cm) Weight: 149 lb 14.6 oz (68 kg) IBW/kg (Calculated) : 57 TPN AdjBW (KG): 60.7 Body mass index is 24.95 kg/m.  Weight at [facility]: 64 kg  Assessment:  37 YOF admitted to [place] on 09/06/16 with SBO and underwent emergent laparotomy for perforation on 09/07/16.  Post-operatively, patient developed ARDS and septic shock.  Patient received 25L of IVF prior to transfer to [facility] on 09/10/16.  Pharmacy consulted to manage TPN for nutritional support while awaiting return of bowel function.  Unable to assess nutritional intake PTA; however, patient has severe malnutrition per RD assessment.  GI: prealbumin 6.2 >> 8.9, NG O/P up to 730mL - PPI IV Endo: no hx DM - CBGs trended up with TPN but remain acceptable Insulin requirements in the past 24 hours: 6 units SSI Lytes: mild hypernatremia, hypokalemia likely d/t Lasix (8 runs ordered), Mag low at 1.3 (6gm ordered), others WNL Renal: SCr 0.53, BUN 21.  Received 25L at [facility], UOP 3 ml/kg/hr, net -3.9L at [facility]. Pulm: COPD.  Intubated, FiO2 down to 40% Cards: demand ischemia with troponin 0.64, EF 50-55%, CVP 5, MAP 47-70, on Levophed briefly, Lasix 40mg  IV Q12H started 6/17 Hepatobil: LFTs / tbili.  TG mildly elevated at 168 Neuro: anxiety.  Fentanyl/Versed gtts - GCS 12, CPOT 0-7, RASS at goal -2 ID: Merrem/Flagyl from PTA for peritonitis - Tmax 101, WBC up 18.6, PCT 13.61, TA cx grew C.albicans Best Practices: heparin SQ, MC, PPI IV TPN Access: triple lumen R-IJ 09/10/16 TPN start date: 09/11/16  Nutritional Goals (per RD rec 6/16): 1500-1600 kCal and 110-130 gm protein per day  Current Nutrition:  TPN   Plan:  - Continue Clinimix E 5/15 to 83 ml/hr.  TPN will provide 1414 kCal and 100gm of protein per day, meeting > 90% of total need. - Hold ILE for the first 7 days of TPN per [employer]  guidelines (start date 09/18/16) - Daily multivitamin in TPN - Trace elements in TPN every other day d/t shortage, next dose 6/19 - Change SSI to moderate Q4H - MD rechecking labs at 2000 - F/U AM labs, CBGs, ?empiric fluconazole    Katleen Carraway D. Mina Marble, PharmD, BCPS Pager:  [phone] 09/13/2016, 7:27 AM

## 2016-09-13 NOTE — Progress Notes (Signed)
Patient placed back on full support due to desaturation into the 80's x2. FIO2 increased to 50% for SpO2 above 90%. VT increased to 460 to match 8 cc/kg per order.

## 2016-09-13 NOTE — Care Management Note (Signed)
Case Management Note Marvetta Gibbons RN, BSN Unit 2W-Case Manager--2H coverage [phone]  Patient Details  Name: Brandi Leonard MRN: [id number] Date of Birth: 19-Mar-1966  Subjective/Objective:  Pt admitted to [facility] with Small bowel obstruction, underwent emergent laparotomy on 6/12 for perforation with peritonitis.  6/15- tx to [facility] with ARDS and septic shock- on vent-  TPN, [place],                  Action/Plan: PTA pt lived at home- CM to follow for d/c needs  Expected Discharge Date:                  Expected Discharge Plan:     In-House Referral:     Discharge planning Services  CM Consult  Post Acute Care Choice:    Choice offered to:     DME Arranged:    DME Agency:     HH Arranged:    HH Agency:     Status of Service:  In process, will continue to follow  If discussed at Long Length of Stay Meetings, dates discussed:    Discharge Disposition:   Additional Comments:  Dawayne Patricia, RN 09/13/2016, 10:20 AM

## 2016-09-13 NOTE — Progress Notes (Signed)
[facility]/Trauma Progress Note      Subjective:  CC: SBO, on vent  Sedated. Will open eyes when prompted.   Objective: Vital signs in last 24 hours: Temp:  [99.2 F (37.3 C)-101 F (38.3 C)] 99.2 F (37.3 C) (06/18 0445) Pulse Rate:  [61-101] 89 (06/18 0800) Resp:  [0-30] 16 (06/18 0800) BP: (82-129)/(47-81) 98/61 (06/18 0700) SpO2:  [88 %-99 %] 96 % (06/18 0800) Arterial Line BP: (53-123)/(41-107) 110/52 (06/18 0700) FiO2 (%):  [40 %-50 %] 40 % (06/18 0800) Weight:  [149 lb 14.6 oz (68 kg)] 149 lb 14.6 oz (68 kg) (06/18 0405) Last BM Date:  (PTA)  Intake/Output from previous day: 06/17 0701 - 06/18 0700 In: 2551.8 [I.V.:2271.8; NG/GT:30; IV Piggyback:250] Out: 5600 [Urine:4900; Emesis/NG output:700] Intake/Output this shift: No intake/output data recorded.  PE: Gen:  On vent, not diaphoretic, sedated, will open eyes Card:  RRR, no M/G/R heard Pulm:  On vent Abd: Soft, absent BS, midline incision with copious serous drainage, pale tissue with poor granulation Skin: warm and dry, anasarca       Lab Results:   Recent Labs  09/12/16 0330 09/13/16 0322  WBC 13.6* 18.6*  HGB 8.5* 10.4*  HCT 26.7* 32.8*  PLT 190 195   BMET  Recent Labs  09/12/16 0330 09/13/16 0322  NA 147* 148*  K 3.7 2.9*  CL 113* 108  CO2 28 32  GLUCOSE 137* 194*  BUN 29* 21*  CREATININE 0.64 0.53  CALCIUM 7.5* 7.1*   PT/INR  Recent Labs  09/10/16 1344  LABPROT 19.8*  INR 1.65   CMP     Component Value Date/Time   NA 148 (H) 09/13/2016 0322   NA 138 03/11/2012 0326   K 2.9 (L) 09/13/2016 0322   K 3.6 03/11/2012 0326   CL 108 09/13/2016 0322   CL 106 03/11/2012 0326   CO2 32 09/13/2016 0322   CO2 25 03/11/2012 0326   GLUCOSE 194 (H) 09/13/2016 0322   GLUCOSE 97 03/11/2012 0326   BUN 21 (H) 09/13/2016 0322   BUN 4 (L) 03/11/2012 0326   CREATININE 0.53 09/13/2016 0322   CREATININE 1.83 (H) 03/15/2012 0449   CALCIUM 7.1 (L) 09/13/2016 0322   CALCIUM 8.1 (L) 03/11/2012 0326   PROT 3.7 (L) 09/13/2016 0322   PROT 8.1 03/05/2012 1834   ALBUMIN 1.3 (L) 09/13/2016 0322   ALBUMIN 4.7 03/05/2012 1834   AST 18 09/13/2016 0322   AST 19 03/05/2012 1834   ALT 22 09/13/2016 0322   ALT 20 03/05/2012 1834   ALKPHOS 57 09/13/2016 0322   ALKPHOS 146 (H) 03/05/2012 1834   BILITOT 0.4 09/13/2016 0322   BILITOT 0.6 03/05/2012 1834   GFRNONAA >60 09/13/2016 0322   GFRNONAA 33 (L) 03/15/2012 0449   GFRAA >60 09/13/2016 0322   GFRAA 38 (L) 03/15/2012 0449   Lipase     Component Value Date/Time   LIPASE 69 (L) 03/05/2012 1834    Studies/Results: Dg Chest Port 1 View  Result Date: 09/13/2016 CLINICAL DATA:  Intubation. EXAM: PORTABLE CHEST 1 VIEW COMPARISON:  09/12/2016. FINDINGS: Endotracheal tube, NG tube, right IJ line stable position. Heart size normal. Persistent bilateral interstitial infiltrates/edema, left side greater than right. No change from prior exam. Persistent basilar atelectasis. Small left pleural effusion cannot be excluded. No pneumothorax . IMPRESSION: 1. Lines and tubes in stable position. 2. Persistent bilateral pulmonary interstitial infiltrates/edema, left side greater than right. No change from prior exam . Persistent bibasilar atelectasis. Small left  pleural effusion cannot be excluded . Electronically Signed   By: Marcello Moores  Register   On: 09/13/2016 07:39   Dg Chest Port 1 View  Result Date: 09/12/2016 CLINICAL DATA:  Respiratory failure. EXAM: PORTABLE CHEST 1 VIEW COMPARISON:  09/11/2016 FINDINGS: Endotracheal tube is in place with tip approximately 3 cm above the carina. Nasogastric tube is in place with tip off the film beyond the gastroesophageal junction. Right IJ central line tip overlies the level of superior vena cava. Heart size is normal. There is significant opacity at the left lung base obscuring the hemidiaphragm. Minimal right lower lobe atelectasis. There is mild interstitial pulmonary edema. The  appearance is stable. IMPRESSION: Persistent significant left lower lobe opacity and minimal right lower lobe atelectasis. Interstitial pulmonary edema. Electronically Signed   By: Nolon Nations M.D.   On: 09/12/2016 08:02    Anti-infectives: Anti-infectives    Start     Dose/Rate Route Frequency Ordered Stop   09/11/16 1300  metroNIDAZOLE (FLAGYL) IVPB 500 mg     500 mg 100 mL/hr over 60 Minutes Intravenous Every 8 hours 09/11/16 1208     09/11/16 1300  meropenem (MERREM) 1 g in sodium chloride 0.9 % 100 mL IVPB     1 g 200 mL/hr over 30 Minutes Intravenous Every 8 hours 09/11/16 1259         Assessment/Plan Sepsis ARDS  SBO with perforation S/P Ex lap, 6/12, [facility] - NGT to [facility], await return of bowel function, cont TNA - BID dressing changes to midline incision - continue IV abx for intraabdominal infection  DISPO: await return of bowel function, dressing changes, to tube feeds at this time    LOS: 3 days    Kalman Drape , [facility] Surgery 09/13/2016, 8:12 AM Pager: [phone] Consults: [phone] Mon-Fri 7:00 am-4:30 pm Sat-Sun 7:00 am-11:30 am

## 2016-09-13 NOTE — Progress Notes (Signed)
[facility] Progress Note Patient Name: Brandi Leonard DOB: 1965-11-25 MRN: [id number]   Date of Service  09/13/2016  HPI/Events of Note    eICU Interventions  Hypokalemia, repleted      Intervention Category Intermediate Interventions: Electrolyte abnormality - evaluation and management  Simonne Maffucci 09/13/2016, 10:49 PM

## 2016-09-13 NOTE — Progress Notes (Signed)
[facility] ADULT ICU REPLACEMENT PROTOCOL FOR AM LAB REPLACEMENT ONLY  The patient does apply for the [facility] Adult ICU Electrolyte Replacment Protocol based on the criteria listed below:   1. Is GFR >/= 40 ml/min? Yes.    Patient's GFR today is >60 2. Is urine output >/= 0.5 ml/kg/hr for the last 6 hours? Yes.   Patient's UOP is 5.0 ml/kg/hr 3. Is BUN < 60 mg/dL? Yes.    Patient's BUN today is 21 4. Abnormal electrolyte(s): K 2.9, Mag 1.3 5. Ordered repletion with: protocol 6. If a panic level lab has been reported, has the CCM MD in charge been notified? No..   Physician:    Ronda Fairly A 09/13/2016 5:16 AM

## 2016-09-14 LAB — BASIC METABOLIC PANEL
Anion gap: 5 (ref 5–15)
Anion gap: 11 (ref 5–15)
BUN: 22 mg/dL — ABNORMAL HIGH (ref 6–20)
BUN: 22 mg/dL — ABNORMAL HIGH (ref 6–20)
CALCIUM: 7.1 mg/dL — AB (ref 8.9–10.3)
CALCIUM: 7.5 mg/dL — AB (ref 8.9–10.3)
CHLORIDE: 102 mmol/L (ref 101–111)
CO2: 31 mmol/L (ref 22–32)
CO2: 24 mmol/L (ref 22–32)
CREATININE: 0.42 mg/dL — AB (ref 0.44–1.00)
CREATININE: 0.45 mg/dL (ref 0.44–1.00)
Chloride: 104 mmol/L (ref 101–111)
GFR calc Af Amer: >60 mL/min (ref >60)
GFR calc non Af Amer: >60 mL/min (ref >60)
GLUCOSE: 237 mg/dL — AB (ref 65–99)
Glucose, Bld: 188 mg/dL — ABNORMAL HIGH (ref 65–99)
Potassium: 4.9 mmol/L (ref 3.5–5.1)
Potassium: 3.9 mmol/L (ref 3.5–5.1)
SODIUM: 139 mmol/L (ref 135–145)
Sodium: 138 mmol/L (ref 135–145)

## 2016-09-14 LAB — RENAL FUNCTION PANEL
ALBUMIN: 1.3 g/dL — AB (ref 3.5–5.0)
Anion gap: 6 (ref 5–15)
BUN: 20 mg/dL (ref 6–20)
CALCIUM: 7.2 mg/dL — AB (ref 8.9–10.3)
CHLORIDE: 104 mmol/L (ref 101–111)
CO2: 31 mmol/L (ref 22–32)
CREATININE: 0.45 mg/dL (ref 0.44–1.00)
GFR calc non Af Amer: >60 mL/min (ref >60)
Glucose, Bld: 204 mg/dL — ABNORMAL HIGH (ref 65–99)
Phosphorus: 2.6 mg/dL (ref 2.5–4.6)
Potassium: 4 mmol/L (ref 3.5–5.1)
SODIUM: 141 mmol/L (ref 135–145)

## 2016-09-14 LAB — CBC WITH DIFFERENTIAL/PLATELET
BASOS ABS: 0 10*3/uL (ref 0.0–0.1)
Basophils Relative: 0 %
Eosinophils Absolute: 0 10*3/uL (ref 0.0–0.7)
Eosinophils Relative: 0 %
HCT: 29.7 % — ABNORMAL LOW (ref 36.0–46.0)
Hemoglobin: 9.3 g/dL — ABNORMAL LOW (ref 12.0–15.0)
LYMPHS ABS: 1.8 10*3/uL (ref 0.7–4.0)
Lymphocytes Relative: 8 %
MCH: 25.1 pg — ABNORMAL LOW (ref 26.0–34.0)
MCHC: 31.3 g/dL (ref 30.0–36.0)
MCV: 80.3 fL (ref 78.0–100.0)
MONO ABS: 1.1 10*3/uL — AB (ref 0.1–1.0)
Monocytes Relative: 5 %
Neutro Abs: 19.6 10*3/uL — ABNORMAL HIGH (ref 1.7–7.7)
Neutrophils Relative %: 87 %
Platelets: 221 10*3/uL (ref 150–400)
RBC: 3.7 MIL/uL — AB (ref 3.87–5.11)
RDW: 17.1 % — AB (ref 11.5–15.5)
WBC: 22.5 10*3/uL — AB (ref 4.0–10.5)

## 2016-09-14 LAB — GLUCOSE, CAPILLARY
GLUCOSE-CAPILLARY: 173 mg/dL — AB (ref 65–99)
GLUCOSE-CAPILLARY: 178 mg/dL — AB (ref 65–99)
GLUCOSE-CAPILLARY: 190 mg/dL — AB (ref 65–99)
GLUCOSE-CAPILLARY: 216 mg/dL — AB (ref 65–99)
Glucose-Capillary: 214 mg/dL — ABNORMAL HIGH (ref 65–99)
Glucose-Capillary: 236 mg/dL — ABNORMAL HIGH (ref 65–99)

## 2016-09-14 LAB — MAGNESIUM
MAGNESIUM: 1.6 mg/dL — AB (ref 1.7–2.4)
Magnesium: 1.9 mg/dL (ref 1.7–2.4)
Magnesium: 1.8 mg/dL (ref 1.7–2.4)

## 2016-09-14 MED ORDER — METOPROLOL TARTRATE 5 MG/5ML IV SOLN
2.5000 mg | INTRAVENOUS | Status: DC | PRN
  Administered 2016-09-17 – 2016-09-21 (×8): 5 mg via INTRAVENOUS
  Filled 2016-09-14 (×10): qty 5

## 2016-09-14 MED ORDER — MIDAZOLAM HCL 2 MG/2ML IJ SOLN
2.0000 mg | Freq: Four times a day (QID) | INTRAMUSCULAR | Status: DC
  Administered 2016-09-14: 2 mg via INTRAVENOUS
  Filled 2016-09-14: qty 2

## 2016-09-14 MED ORDER — LABETALOL HCL 5 MG/ML IV SOLN
10.0000 mg | INTRAVENOUS | Status: DC | PRN
  Filled 2016-09-14: qty 4

## 2016-09-14 MED ORDER — MIDAZOLAM HCL 2 MG/2ML IJ SOLN
2.0000 mg | Freq: Three times a day (TID) | INTRAMUSCULAR | Status: DC
  Administered 2016-09-15 – 2016-09-16 (×4): 2 mg via INTRAVENOUS
  Filled 2016-09-14 (×4): qty 2

## 2016-09-14 MED ORDER — ONDANSETRON HCL 4 MG/2ML IJ SOLN
4.0000 mg | Freq: Four times a day (QID) | INTRAMUSCULAR | Status: DC | PRN
  Administered 2016-10-01 (×2): 4 mg via INTRAVENOUS
  Filled 2016-09-14 (×2): qty 2

## 2016-09-14 MED ORDER — TRACE MINERALS CR-CU-MN-SE-ZN 10-1000-500-60 MCG/ML IV SOLN
INTRAVENOUS | Status: DC
  Administered 2016-09-14: 17:00:00 via INTRAVENOUS
  Filled 2016-09-14: qty 1992

## 2016-09-14 MED ORDER — MAGNESIUM SULFATE 2 GM/50ML IV SOLN
2.0000 g | Freq: Once | INTRAVENOUS | Status: AC
  Administered 2016-09-15: 2 g via INTRAVENOUS
  Filled 2016-09-14: qty 50

## 2016-09-14 MED ORDER — POTASSIUM PHOSPHATES 15 MMOLE/5ML IV SOLN
10.0000 mmol | Freq: Once | INTRAVENOUS | Status: AC
  Administered 2016-09-14: 10 mmol via INTRAVENOUS
  Filled 2016-09-14: qty 3.33

## 2016-09-14 MED ORDER — MAGNESIUM SULFATE 4 GM/100ML IV SOLN
4.0000 g | Freq: Once | INTRAVENOUS | Status: AC
  Administered 2016-09-14: 4 g via INTRAVENOUS
  Filled 2016-09-14: qty 100

## 2016-09-14 NOTE — Progress Notes (Signed)
PHARMACY - ADULT TOTAL PARENTERAL NUTRITION CONSULT NOTE   Pharmacy Consult:  TPN Indication:  SBO   Patient Measurements: Height: 5\' 5"  (165.1 cm) Weight: 143 lb 15.4 oz (65.3 kg) IBW/kg (Calculated) : 57 TPN AdjBW (KG): 60.7 Body mass index is 23.96 kg/m.  Weight at [facility]: 64 kg  Assessment:  43 YOF admitted to [facility] on 09/06/16 with SBO and underwent emergent laparotomy for perforation on 09/07/16.  Post-operatively, patient developed ARDS and septic shock.  Patient received 25L of IVF prior to transfer to [facility] on 09/10/16.  Pharmacy consulted to manage TPN for nutritional support while awaiting return of bowel function.  Unable to assess nutritional intake PTA; however, patient has severe malnutrition per RD assessment.  GI: prealbumin 6.2 >> 8.9, NG O/P 354mL - PPI IV Endo: no hx DM - CBGs trended up with TPN, now uncontrolled Insulin requirements in the past 24 hours: 16 units SSI Lytes: all WNL except slightly low Mag (Phos low normal) Renal: SCr 0.53, BUN 21.  Received 25L at [facility], UOP 2.1 ml/kg/hr, net -3.9L at [facility]. Pulm: COPD.  Intubated, FiO2 40% Cards: demand ischemia with troponin 0.64, EF 50-55%, VSS - Lasix 40mg  IV Q12H started 6/17 Hepatobil: LFTs / tbili.  TG mildly elevated at 168 Neuro: anxiety.  Fentanyl, off Versed gtt ID: Merrem/Flagyl from PTA for peritonitis and ?PNA - afebrile, WBC up 22.5, PCT 13.61, TA cx grew C.[place] Best Practices: heparin SQ, MC, PPI IV TPN Access: triple lumen R-IJ 09/10/16 TPN start date: 09/11/16  Nutritional Goals (per RD rec 6/16): 1500-1600 kCal and 110-130 gm protein per day  Current Nutrition:  TPN   Plan:  - Continue Clinimix E 5/15 at 83 ml/hr.  TPN provides 1414 kCal and 100gm of protein per day, meeting > 90% of total need. - Hold ILE for the first 7 days of TPN per SCCM/ASPEN guidelines (start date 09/18/16) - Daily multivitamin in TPN - Trace elements in TPN every other day d/t shortage, next dose 6/19 -  Continue moderate SSI Q4H + add 15 units regular insulin in TPN - Mag sulfate 4gm IV x 1 - KPhos 10 mmol IV x 1.  Repeat labs on Thurs. - F/U AM labs, CBGs, ?empiric fluconazole   Brandi Leonard, PharmD, BCPS Pager:  [phone] 09/14/2016, 7:28 AM

## 2016-09-14 NOTE — Consult Note (Addendum)
[place] Nurse wound consult note Surgical team is following for assessment and plan of care. Reason for Consult: Consult requested to apply Vac dressing to abd Wound type: Full thickness midline post-op wound Measurement: 17X5X2.5cm Wound bed: pale red with visible retention sutures in the base Drainage (amount, consistency, odor) large amt yellow serous drainage, no odor Periwound: Intact skin surrounding Dressing procedure/placement/frequency: Applied one piece Mepitel contact layer to protect woundbed, then one piece of black foam to 175mm cont suction.  Pt was medicated for pain prior to the procedure and tolerated with mod amt discomfort.  Pt is on the ventilator and no family members are present to discuss plan of care. Plan for the bedside nurse to change on FRI, then begin a M/W/F dressing change routine next week.  Please re-consult if further assistance is needed.  Thank-you,  Julien Girt MSN, [place], [place], [place], [place]

## 2016-09-14 NOTE — Progress Notes (Signed)
   Subjective/Chief Complaint: Ventilator dependent  Patient is much more awake and alert on vent Vent is weaning per Respiratory No bowel function yet    Objective: Vital signs in last 24 hours: Temp:  [97.9 F (36.6 C)-99.1 F (37.3 C)] 98.2 F (36.8 C) (06/19 0400) Pulse Rate:  [66-107] 73 (06/19 0715) Resp:  [0-24] 18 (06/19 0715) BP: (101-138)/(46-85) 110/53 (06/19 0715) SpO2:  [86 %-99 %] 96 % (06/19 0715) Arterial Line BP: (80-137)/(45-90) 108/46 (06/19 0715) FiO2 (%):  [40 %-65 %] 40 % (06/19 0328) Weight:  [65.3 kg (143 lb 15.4 oz)] 65.3 kg (143 lb 15.4 oz) (06/19 0500) Last BM Date:  (PTA)  Intake/Output from previous day: 06/18 0701 - 06/19 0700 In: 3471.6 [I.V.:2461.6; NG/GT:60; IV Piggyback:950] Out: 3600 [Urine:3300; Emesis/NG output:300] Intake/Output this shift: No intake/output data recorded.  General appearance: alert, cooperative and intubated Resp: bilateral coarse breath sounds Cardio: regular rate and rhythm, S1, S2 normal, no murmur, click, rub or gallop GI: some anasarca; absent bowel sounds; no peritonitis; incisional tenderness Wound - clean; some serous drainage; no purulence; minimal granulation tissue  Lab Results:   Recent Labs  09/13/16 0322 09/14/16 0424  WBC 18.6* 22.5*  HGB 10.4* 9.3*  HCT 32.8* 29.7*  PLT 195 221   BMET  Recent Labs  09/13/16 2002 09/14/16 0424  NA 145 141  K 3.5 4.0  CL 107 104  CO2 32 31  GLUCOSE 173* 204*  BUN 21* 20  CREATININE 0.36* 0.45  CALCIUM 7.2* 7.2*   PT/INR No results for input(s): LABPROT, INR in the last 72 hours. ABG  Recent Labs  09/12/16 0422 09/13/16 0354  PHART 7.427 7.514*  HCO3 28.0 34.3*    Studies/Results: Dg Chest Port 1 View  Result Date: 09/13/2016 CLINICAL DATA:  Intubation. EXAM: PORTABLE CHEST 1 VIEW COMPARISON:  09/12/2016. FINDINGS: Endotracheal tube, NG tube, right IJ line stable position. Heart size normal. Persistent bilateral interstitial  infiltrates/edema, left side greater than right. No change from prior exam. Persistent basilar atelectasis. Small left pleural effusion cannot be excluded. No pneumothorax . IMPRESSION: 1. Lines and tubes in stable position. 2. Persistent bilateral pulmonary interstitial infiltrates/edema, left side greater than right. No change from prior exam . Persistent bibasilar atelectasis. Small left pleural effusion cannot be excluded . Electronically Signed   By: Marcello Moores  Register   On: 09/13/2016 07:39    Anti-infectives: Anti-infectives    Start     Dose/Rate Route Frequency Ordered Stop   09/11/16 1300  metroNIDAZOLE (FLAGYL) IVPB 500 mg     500 mg 100 mL/hr over 60 Minutes Intravenous Every 8 hours 09/11/16 1208     09/11/16 1300  meropenem (MERREM) 1 g in sodium chloride 0.9 % 100 mL IVPB     1 g 200 mL/hr over 30 Minutes Intravenous Every 8 hours 09/11/16 1259        Assessment/Plan: Sepsis ARDS  SBO with perforation S/P Ex lap, 09/07/16, [facility] - Dr. Brantley Stage - NGT to [facility], await return of bowel function, cont TNA - Midline wound - will ask WOCN to place VAC - continue IV abx for intraabdominal infection  DISPO: await return of bowel function, would not be able to tolerate tube feeds at this time VAC placement Vent management per CCM   LOS: 4 days    Carley Strickling K. 09/14/2016

## 2016-09-14 NOTE — Progress Notes (Signed)
PCCM Interval Progress Note  2 issues: 1.  Grand mal seizure - lasted a few seconds.  Of note, pt on klonopin as outpatient and was on midazolam gtt here but this was stopped around 10AM 09/14/16.  ? Benzo withdrawal seizures. 2.  7 beat run of VT and now with persistent tachycardia.  Labs drawn > Mag 1.8, K 3.9.  Will order: 1.  Versed 2mg  scheduled q6hrs.  If persists, will need EEG and neuro consult. 2.  2mg  Magnesium ordered x 1 and Lopressor 2.5-5mg  q3hrs PRN. 3.  Repeat labs already ordered for AM.   Montey Hora, PA - C  Pulmonary & Critical Care Medicine Pager: [phone]  or [phone] 09/14/2016, 11:29 PM

## 2016-09-14 NOTE — Progress Notes (Signed)
[place] Pulmonary & Critical Care Attending Note  ADMISSION DATE:09/10/2016  CONSULTATION DATE:09/10/2016  REFERRING MD:Dr. [facility]  CHIEF COMPLAINT:Shock  Presenting HPI:  51 y.o. female admitted to [facility] 6/11 with Small bowel obstruction, underwent emergent laparotomy on 6/12 for perforation with peritonitis. Failed extubation postoperatively and then developed diffuse bilateral infiltrates consistent with ARDS and septic shock requiring Levophed. 25L positive since admission to outside hospital.   Subjective:  No acute events overnight. Patient reporting nausea & difficulty breathing this morning. Denies any pain.   Review of Systems:  Unable to obtain given intubation.   Vent Mode: PSV;CPAP FiO2 (%):  [40 %-65 %] 40 % Set Rate:  [18 bmp-24 bmp] 18 bmp Vt Set:  [460 mL] 460 mL PEEP:  [5 cmH20-10 cmH20] 5 cmH20 Pressure Support:  [5 cmH20] 5 cmH20 Plateau Pressure:  [13 cmH20-20 cmH20] 13 cmH20  Temp:  [97.9 F (36.6 C)-99.1 F (37.3 C)] 98.5 F (36.9 C) (06/19 0815) Pulse Rate:  [70-107] 72 (06/19 0845) Resp:  [0-24] 19 (06/19 0845) BP: (101-138)/(46-85) 113/54 (06/19 0845) SpO2:  [86 %-99 %] 95 % (06/19 0845) Arterial Line BP: (80-137)/(45-90) 111/48 (06/19 0845) FiO2 (%):  [40 %-65 %] 40 % (06/19 0804) Weight:  [143 lb 15.4 oz (65.3 kg)] 143 lb 15.4 oz (65.3 kg) (06/19 0500)  General:  No family at bedside. Intubated. No distress. Awake. Integument:  Warm & dry. No rash on exposed skin. Abdominal dressing clean & dry.  HEENT:  Moist mucus memebranes. No scleral icterus. Endotracheal tube in place. Neurological:  Pupils symmetric. Following commands. Moving all 4 extremities equally. Musculoskeletal:  No joint effusion or erythema appreciated. Symmetric muscle bulk. Pulmonary:  Symmetric chest wall rise on ventilator. Distant breath sounds bilaterally. Cardiovascular:  Regular rate. No appreciable JVD. Anasarca. Telemetry:  Sinus  rhythm. Abdomen:  Soft. Protuberant. Absent bowel sounds.  LINES/TUBES: R FEM CVL 6/12 - 6/15 OETT 6/13 >>> R IJ CVL 6/15 >>> R RAD ART LINE 6/15 >>> Foley 6/13 >>> OGT 6/13 >>> PIV  CBC Latest Ref Rng & Units 09/14/2016 09/13/2016 09/12/2016  WBC 4.0 - 10.5 K/uL 22.5(H) 18.6(H) 13.6(H)  Hemoglobin 12.0 - 15.0 g/dL 9.3(L) 10.4(L) 8.5(L)  Hematocrit 36.0 - 46.0 % 29.7(L) 32.8(L) 26.7(L)  Platelets 150 - 400 K/uL 221 195 190    BMP Latest Ref Rng & Units 09/14/2016 09/13/2016 09/13/2016  Glucose 65 - 99 mg/dL 204(H) 173(H) 194(H)  BUN 6 - 20 mg/dL 20 21(H) 21(H)  Creatinine 0.44 - 1.00 mg/dL 0.45 0.36(L) 0.53  Sodium 135 - 145 mmol/L 141 145 148(H)  Potassium 3.5 - 5.1 mmol/L 4.0 3.5 2.9(L)  Chloride 101 - 111 mmol/L 104 107 108  CO2 22 - 32 mmol/L 31 32 32  Calcium 8.9 - 10.3 mg/dL 7.2(L) 7.2(L) 7.1(L)    Hepatic Function Latest Ref Rng & Units 09/14/2016 09/13/2016 09/12/2016  Total Protein 6.5 - 8.1 g/dL - 3.7(L) 3.7(L)  Albumin 3.5 - 5.0 g/dL 1.3(L) 1.3(L) 1.3(L)  AST 15 - 41 U/L - 18 18  ALT 14 - 54 U/L - 22 28  Alk Phosphatase 38 - 126 U/L - 57 62  Total Bilirubin 0.3 - 1.2 mg/dL - 0.4 0.3    IMAGING/STUDIES: CT ABDOMEN 6/11: multiple dilated loops of fluid filled small bowel consistent with mechanical small bowel obstruction. TTE 6/16:  LV normal in size with EF 50-55%. Grade 1 diastolic dysfunction without regional wall motion abnormality. LA & RA normal in size. RV mildly dilated with preserved systolic  function. No aortic stenosis or significant regurgitation. Aortic root normal in size. Mild mitral regurgitation without stenosis. No significant pulmonic regurgitation or stenosis. Mild tricuspid regurgitation without stenosis. Large left pleural effusion with trivial circumferential pericardial effusion. PORT CXR 6/18:  Personally reviewed by me. Right IJ central venous catheter in good position. Endotracheal tube in good position. Enteric feeding tube coursing below  diaphragm. Persistent silhouetting of left hemidiaphragm.  MICROBIOLOGY: MRSA PCR 6/15:  Negative Blood Cultures x2 6/15 >>> Urine Culture 6/15:  Negative  Tracheal Aspirate Culture 6/15:  Few Candida albicans HIV 6/15:  Nonreactive   ANTIBIOTICS: Levaquin 6/12 - 6/15 Primaxin 6/12 >>> Flagyl 6/12 >>>  SIGNIFICANT EVENTS: 06/11 - Admit to [place] w/ SBO 06/12 - Perforation w/ peritonitis>>taken to OR for repair 06/13 - Reintubated for respiratory failure & shock 06/15 - Transfer to [facility] for resp failure & persistent shock 06/16 - Started diuresis  ASSESSMENT/PLAN:  51 y.o. female admitted for small bowel obstruction status post perforation and surgical repair. Bowel function has not yet returned. Weaned off of vasopressors and tolerating diuresis well.  1. Septic shock: Shock has resolved. Continuing broad-spectrum antibiotics with Primaxin and Flagyl. Awaiting finalization of blood cultures from 6/15. Plan to repeat culture for any fever. Trending leukocytosis.  2. Acute hypoxic respiratory failure: Grossly volume overloaded. Continuing Lasix IV every 12 hours. Continuing daily spontaneous breathing trial and pressure support wean. 3. Peritonitis: Continuing broad-spectrum antibiotics day #7/7 as above. Secondary to perforated bowel. 4. Elevated troponin I: Suspect demand ischemia in the setting of septic shock. 5. Hypokalemia/hypomagnesemia: Secondary to diuresis. Replacing potassium and magnesium. Repeat electrolytes at 5 PM today. 6. Small bowel obstruction with perforation: Status post surgical repair 6/12. Wound VAC placement today. Postoperative care as per general surgery. 7. Severe protein-calorie malnutrition: Continuing TPN. 8. Anemia: No evidence of active bleeding. Trending cell counts daily with CBC. 9. Relative adrenal insufficiency: Stress dosed steroids discontinued. 10. Acute encephalopathy: Resolving. Multifactorial in etiology. Minimizing  sedation. 11. Hypernatremia: Resolved. Monitor electrolytes daily.  Prophylaxis:  SCDs, Heparin [place] q8hr, & Protonix IV qhs.  Diet:  NPO.  Continuing TPN.  Code Status:  Full Code per previous physician discussions.  Disposition:  Remains critically ill on ventilator support.  Family Update:  No family at bedside at the time of my rounds.   I have personally spent a total of 31 minutes of critical care time today caring for the patient & reviewing the patient's electronic medical record.  Sonia Baller Ashok Cordia, M.D. [facility] Pulmonary & Critical Care Pager:  [phone] After 3pm or if no response, call [phone] 9:09 AM 09/14/16

## 2016-09-14 NOTE — Progress Notes (Signed)
Pharmacy Antibiotic Note  Brandi Leonard is a 51 y.o. female admitted on 09/10/2016 with intra-abdominal infection.  Pharmacy has been consulted for meropenem dosing.  Also on flagyl.    Plan: Meropenem 1g IV q8h F/u cultures, renal function and clinical course ?Add fluconazole with candida in trach aspirate  Height: 5\' 5"  (165.1 cm) Weight: 143 lb 15.4 oz (65.3 kg) IBW/kg (Calculated) : 57  Temp (24hrs), Avg:98.5 F (36.9 C), Min:97.9 F (36.6 C), Max:99.1 F (37.3 C)   Recent Labs Lab 09/10/16 1344 09/11/16 0347 09/12/16 0330 09/13/16 0322 09/13/16 2002 09/14/16 0424  WBC 17.8* 15.9* 13.6* 18.6*  --  22.5*  CREATININE 0.62 0.66 0.64 0.53 0.36* 0.45    Estimated Creatinine Clearance: 77.4 mL/min (by C-G formula based on SCr of 0.45 mg/dL).    Allergies  Allergen Reactions  . Penicillins Rash    Has patient had a PCN reaction causing immediate rash, facial/tongue/throat swelling, SOB or lightheadedness with hypotension: Unknown Has patient had a PCN reaction causing severe rash involving mucus membranes or skin necrosis: Unknown Has patient had a PCN reaction that required hospitalization: No Has patient had a PCN reaction occurring within the last 10 years: No If all of the above answers are "NO", then may proceed with Cephalosporin use.     Antimicrobials this admission:  Flagyl 6/11 >>  Meropenem 6/15 >>  Levaquin 6/11 > 6/14 @1530  Imipenem 6/12 > 6/15  Dose adjustments this admission:    Microbiology results:  6/15 BCx: ngtd 6/15 UCx: neg 6/15 Sputum: few candida albicans 6/15 MRSA PCR: neg  [place] Cultures -6/13 Trach asp > moderate yeast -6/13 BCx x 2 -neg (F) -6/13 UCx - neg -6/14 BCx 1/2 w/ CNS   Thank you for allowing pharmacy to be a part of this patient's care.  Jesiah Yerby D. Joachim Carton, PharmD, BCPS Clinical Pharmacist Pager: [phone] Clinical Phone for 09/14/2016 until 3:30pm: [phone] If after 3:30pm, please call main pharmacy at  [phone] 09/14/2016 8:04 AM

## 2016-09-14 NOTE — Progress Notes (Addendum)
Nutrition Follow Up  DOCUMENTATION CODES:   Not applicable  INTERVENTION:    TPN per pharmacy  NUTRITION DIAGNOSIS:   Malnutrition (Severe) related to acute illness (SBO w/ perferation s/p SBR) as evidenced by severe fluid accumulation and an energy intake that has met < or equal to 50% of estimated needs for > or equal to 5 days, ongoing  GOAL:   Patient will meet greater than or equal to 90% of their needs, progressing  MONITOR:   Labs, Vent status, I & O's, Diet advancement, Skin, Weight trends  ASSESSMENT:  51 y/o female PMHx Cholecystitis, COPD, anxiety, anemia. Presented to [facility] 6/11 w/ abdominal pain, n/v. Worked up for SBO. Transferred to [facility].  6/12 emergent laparotomy for bowel perforation >> [facility] 6/15 transferred to [facility] 6/16 TPN started  Patient is currently intubated on ventilator support >> NGT to LIS MV: 9.4 L/min Temp (24hrs), Avg:98.4 F (36.9 C), Min:98.1 F (36.7 C), Max:99.1 F (37.3 C)  Pt with acute hypoxemic respiratory failure, ARDS. Septic shock resolved.  Broad-spectrum ABX continued for peritonitis. Open abdomen. VAC placed today per CWOCN. Medications reviewed. CBG's [id number]. Labs reviewed. Mag 1.6 (L).  Patient is receiving TPN with Clinimix E 5/15 @ 83 ml/hr. No lipids at this time per SCCM/ASPEN guidelines. Provides 1414 kcal and 100 grams protein per day. Meets 93% minimum estimated energy needs and 87% minimum estimated protein needs.  Diet Order:  Diet NPO time specified .TPN (CLINIMIX-E) Adult .TPN (CLINIMIX-E) Adult  Skin:  full thickness midline post-op wound  Last BM:  PTA  Height:   Ht Readings from Last 1 Encounters:  09/10/16 '5\' 5"'  (1.651 m)   Weight:   Wt Readings from Last 1 Encounters:  09/14/16 143 lb 15.4 oz (65.3 kg)   Ideal Body Weight:  56.82 kg  Body mass index is 23.96 kg/m.  Re-estimated Nutritional Needs:   Kcal:  1540   Protein:  115-130 gm   Fluid:   per MD  EDUCATION NEEDS:   No education needs identified at this time  Arthur Holms, RD, LDN Pager #: [phone] After-Hours Pager #: [phone]

## 2016-09-15 DIAGNOSIS — J9600 Acute respiratory failure, unspecified whether with hypoxia or hypercapnia: Secondary | ICD-10-CM

## 2016-09-15 LAB — BASIC METABOLIC PANEL
Anion gap: 7 (ref 5–15)
BUN: 23 mg/dL — ABNORMAL HIGH (ref 6–20)
CALCIUM: 7.5 mg/dL — AB (ref 8.9–10.3)
CO2: 28 mmol/L (ref 22–32)
CREATININE: 0.4 mg/dL — AB (ref 0.44–1.00)
Chloride: 104 mmol/L (ref 101–111)
Glucose, Bld: 151 mg/dL — ABNORMAL HIGH (ref 65–99)
Potassium: 3.4 mmol/L — ABNORMAL LOW (ref 3.5–5.1)
SODIUM: 139 mmol/L (ref 135–145)

## 2016-09-15 LAB — GLUCOSE, CAPILLARY
GLUCOSE-CAPILLARY: 177 mg/dL — AB (ref 65–99)
GLUCOSE-CAPILLARY: 144 mg/dL — AB (ref 65–99)
GLUCOSE-CAPILLARY: 171 mg/dL — AB (ref 65–99)
GLUCOSE-CAPILLARY: 143 mg/dL — AB (ref 65–99)
Glucose-Capillary: 160 mg/dL — ABNORMAL HIGH (ref 65–99)
Glucose-Capillary: 147 mg/dL — ABNORMAL HIGH (ref 65–99)

## 2016-09-15 LAB — CULTURE, BLOOD (ROUTINE X 2)
CULTURE: NO GROWTH
Culture: NO GROWTH
SPECIAL REQUESTS: ADEQUATE

## 2016-09-15 LAB — POCT I-STAT, CHEM 8
BUN: 24 mg/dL — ABNORMAL HIGH (ref 6–20)
CALCIUM ION: 1.18 mmol/L (ref 1.15–1.40)
CREATININE: 0.3 mg/dL — AB (ref 0.44–1.00)
Chloride: 101 mmol/L (ref 101–111)
GLUCOSE: 201 mg/dL — AB (ref 65–99)
HCT: 33 % — ABNORMAL LOW (ref 36.0–46.0)
HEMOGLOBIN: 11.2 g/dL — AB (ref 12.0–15.0)
Potassium: 3.6 mmol/L (ref 3.5–5.1)
Sodium: 140 mmol/L (ref 135–145)
TCO2: 27 mmol/L (ref 0–100)

## 2016-09-15 LAB — CBC WITH DIFFERENTIAL/PLATELET
BASOS PCT: 0 %
Basophils Absolute: 0 10*3/uL (ref 0.0–0.1)
EOS ABS: 0 10*3/uL (ref 0.0–0.7)
EOS PCT: 0 %
HCT: 30.2 % — ABNORMAL LOW (ref 36.0–46.0)
Hemoglobin: 9.4 g/dL — ABNORMAL LOW (ref 12.0–15.0)
Lymphocytes Relative: 7 %
Lymphs Abs: 1.5 10*3/uL (ref 0.7–4.0)
MCH: 24.8 pg — ABNORMAL LOW (ref 26.0–34.0)
MCHC: 31.1 g/dL (ref 30.0–36.0)
MCV: 79.7 fL (ref 78.0–100.0)
MONO ABS: 1 10*3/uL (ref 0.1–1.0)
MONOS PCT: 5 %
Neutro Abs: 19.4 10*3/uL — ABNORMAL HIGH (ref 1.7–7.7)
Neutrophils Relative %: 88 %
Platelets: 322 10*3/uL (ref 150–400)
RBC: 3.79 MIL/uL — ABNORMAL LOW (ref 3.87–5.11)
RDW: 17.1 % — AB (ref 11.5–15.5)
WBC: 21.9 10*3/uL — ABNORMAL HIGH (ref 4.0–10.5)

## 2016-09-15 LAB — RENAL FUNCTION PANEL
Albumin: 1.3 g/dL — ABNORMAL LOW (ref 3.5–5.0)
Anion gap: 7 (ref 5–15)
BUN: 23 mg/dL — AB (ref 6–20)
CALCIUM: 7.2 mg/dL — AB (ref 8.9–10.3)
CO2: 31 mmol/L (ref 22–32)
CREATININE: 0.37 mg/dL — AB (ref 0.44–1.00)
Chloride: 103 mmol/L (ref 101–111)
GFR calc non Af Amer: >60 mL/min (ref >60)
GLUCOSE: 178 mg/dL — AB (ref 65–99)
Phosphorus: 3.1 mg/dL (ref 2.5–4.6)
Potassium: 3 mmol/L — ABNORMAL LOW (ref 3.5–5.1)
SODIUM: 141 mmol/L (ref 135–145)

## 2016-09-15 LAB — MAGNESIUM: MAGNESIUM: 2.1 mg/dL (ref 1.7–2.4)

## 2016-09-15 MED ORDER — POTASSIUM CHLORIDE 10 MEQ/50ML IV SOLN
10.0000 meq | INTRAVENOUS | Status: AC
  Administered 2016-09-15 (×4): 10 meq via INTRAVENOUS
  Filled 2016-09-15 (×4): qty 50

## 2016-09-15 MED ORDER — MAGNESIUM SULFATE 50 % IJ SOLN
INTRAMUSCULAR | Status: DC
  Filled 2016-09-15: qty 1992

## 2016-09-15 MED ORDER — M.V.I. ADULT IV INJ
INTRAVENOUS | Status: AC
  Administered 2016-09-15: 18:00:00 via INTRAVENOUS
  Filled 2016-09-15: qty 1992

## 2016-09-15 MED ORDER — VITAL HIGH PROTEIN PO LIQD
1000.0000 mL | ORAL | Status: DC
  Administered 2016-09-15: 1000 mL

## 2016-09-15 MED ORDER — M.V.I. ADULT IV INJ: INTRAVENOUS | Status: DC

## 2016-09-15 MED ORDER — TRACE MINERALS CR-CU-MN-SE-ZN 10-1000-500-60 MCG/ML IV SOLN: INTRAVENOUS | Status: AC

## 2016-09-15 MED ORDER — CLINIMIX E/DEXTROSE (5/15) 5 % IV SOLN: INTRAVENOUS | Status: DC

## 2016-09-15 NOTE — Progress Notes (Signed)
PHARMACY - ADULT TOTAL PARENTERAL NUTRITION CONSULT NOTE   Pharmacy Consult:  TPN Indication:  SBO   Patient Measurements: Height: 5\' 5"  (165.1 cm) Weight: 143 lb 15.4 oz (65.3 kg) IBW/kg (Calculated) : 57 TPN AdjBW (KG): 60.7 Body mass index is 23.96 kg/m.  Weight at [facility]: 64 kg  Assessment:  27 YOF admitted to [facility] on 09/06/16 with SBO and underwent emergent laparotomy for perforation on 09/07/16.  Post-operatively, patient developed ARDS and septic shock.  Patient received 25L of IVF prior to transfer to [facility] on 09/10/16.  Pharmacy consulted to manage TPN for nutritional support while awaiting return of bowel function.  Unable to assess nutritional intake PTA; however, patient has severe malnutrition per RD assessment.  GI: prealbumin 6.2 >> 8.9, NG O/P 337mL - PPI IV Endo: no hx DM - CBGs trended up with TPN, now uncontrolled Insulin requirements in the past 24 hours: 22 units SSI and 15 units in TPN Lytes: Received 6gm Magnesium and 57meq of K+ yesterday - has orders for 60meq today for K+=3.0.  All other lytes WNL.  Corrected calcium for low albumin - 9.4 Renal: SCr 0.53, BUN 21.  Received 25L at [facility], UOP 2.1 ml/kg/hr, net -3.9L (received IV Lasix 120mg  6/19 - getting 40mg  q12) Pulm: COPD.  Intubated, FiO2 40% Cards: demand ischemia with troponin 0.64, EF 50-55%, VSS - Lasix 40mg  IV Q12H started 6/17 - now tachy - added Metoprolol Hepatobil: LFTs / tbili.  TG mildly elevated at 168 Neuro: anxiety.  Had grand mal seizure - ? Benzo withdrawal - added scheduled midazolam, Fent. at 60mcg/hr ID: Merrem/Flagyl from PTA for peritonitis and ?PNA - afebrile, WBC up 21.9, PCT 13.61, TA cx grew C.albicans Best Practices: heparin SQ, MC, PPI IV TPN Access: triple lumen R-IJ 09/10/16 TPN start date: 09/11/16  Nutritional Goals (per RD rec 6/16): 1500-1600 kCal and 110-130 gm protein per day  Current Nutrition:  TPN   Plan:  - Continue Clinimix E 5/15 at 83 ml/hr.  TPN provides 1414  kCal and 100gm of protein per day, meeting > 90% of total need. - Hold ILE for the first 7 days of TPN per SCCM/ASPEN guidelines (start date 09/18/16) - Daily multivitamin in TPN - Trace elements in TPN every other day d/t shortage, next dose 6/21 - Continue moderate SSI Q4H + increase to 20 units regular insulin in TPN - F/U AM labs, CBGs  Rober Minion, PharmD., MS Clinical Pharmacist Pager:  [phone] Thank you for allowing pharmacy to be part of this patients care team. 09/15/2016, 7:43 AM

## 2016-09-15 NOTE — Progress Notes (Signed)
PULMONARY / CRITICAL CARE MEDICINE   Name: ROCHELLE LARUE MRN: [id number] DOB: Dec 28, 1965    ADMISSION DATE:  09/10/2016 CONSULTATION DATE:  09/10/2016  REFERRING MD:  Dr. [facility]  CHIEF COMPLAINT:  shock  HISTORY OF PRESENT ILLNESS:   51 year old admitted to [facility] 6/11 with Small bowel obstruction, underwent emergent laparotomy on 6/12 for perforation with peritonitis. Failed extubation postoperatively and then developed diffuse bilateral infiltrates consistent with ARDS and septic shock requiring Levophed  STUDIES:  CT abdomen 6/11 > multiple dilated loops of fluid filled small bowel consistent with mechanical small bowel obstruction Echo 6/16 >> nml LV fn, mild apical pericardial effusion  CULTURES: Blood 6/12 > Coag neg staph 1/2 bottles  ANTIBIOTICS: Primaxin 6/12 > 6/20 Levaquin 6/12 >> 6/12 Flagyl 6/12 > 6/20  SIGNIFICANT EVENTS: 6/11 admit for SBO 6/12 perf to OR for repair 6/13 contnue shock, resp failure 6/15 transfer to [facility] for ICU admit. 6/16 started diuresis  LINES/TUBES: ETT 6/13 > R fem CVL 6/12 >6/15 Art line rt radial 6/15 > IJ CVL 6/15   SUBJECTIVE:  Seizure x1 and tachycardia/VT overnight. None further.  Tolerating PS 10/5 this am.   VITAL SIGNS: BP (!) 105/52   Pulse 69   Temp 98.5 F (36.9 C) (Axillary)   Resp 19   Ht 5\' 5"  (1.651 m)   Wt 65.3 kg (143 lb 15.4 oz)   SpO2 98%   BMI 23.96 kg/m   HEMODYNAMICS:    VENTILATOR SETTINGS: Vent Mode: PSV;CPAP FiO2 (%):  [40 %] 40 % Set Rate:  [18 bmp] 18 bmp Vt Set:  [460 mL] 460 mL PEEP:  [5 cmH20] 5 cmH20 Pressure Support:  [8 cmH20-10 cmH20] 10 cmH20 Plateau Pressure:  [13 cmH20-18 cmH20] 16 cmH20  INTAKE / OUTPUT: I/O last 3 completed shifts: In: 0263 [I.V.:3160.6; NG/GT:90; IV Piggyback:1503.3] Out: 7200 [Urine:6300; Emesis/NG output:750; Drains:150]  PHYSICAL EXAMINATION: General:   Acutely ill appearing female, NAD  HEENT: MM pink/moist, ETT Neuro:   Awake, nods appropriately, follows all commands  CV: s1s2 rrr, no m/r/g PULM: even/non-labored on vent, scattered rhonchi, diminished bases  GI: large surgical wound, soft, mildly tender, -bs Extremities: warm/dry, anascarca  Skin: no rashes or lesions   LABS:  BMET  Recent Labs Lab 09/14/16 1629 09/14/16 2210 09/14/16 2221 09/15/16 0417  NA 138 139 140 141  K 4.9 3.9 3.6 3.0*  CL 102 104 101 103  CO2 31 24  --  31  BUN 22* 22* 24* 23*  CREATININE 0.42* 0.45 0.30* 0.37*  GLUCOSE 237* 188* 201* 178*    Electrolytes  Recent Labs Lab 09/13/16 0322  09/14/16 0424 09/14/16 1629 09/14/16 2210 09/15/16 0417  CALCIUM 7.1*  < > 7.2* 7.1* 7.5* 7.2*  MG 1.3*  < > 1.6* 1.9 1.8 2.1  PHOS 3.5  --  2.6  --   --  3.1  < > = values in this interval not displayed.  CBC  Recent Labs Lab 09/13/16 0322 09/14/16 0424 09/14/16 2221 09/15/16 0417  WBC 18.6* 22.5*  --  21.9*  HGB 10.4* 9.3* 11.2* 9.4*  HCT 32.8* 29.7* 33.0* 30.2*  PLT 195 221  --  322    Coag's  Recent Labs Lab 09/10/16 1344  INR 1.65    Sepsis Markers  Recent Labs Lab 09/10/16 1344  PROCALCITON 13.61    ABG  Recent Labs Lab 09/11/16 1740 09/12/16 0422 09/13/16 0354  PHART 7.445 7.427 7.514*  PCO2ART 42.9 43.3 42.7  PO2ART 82.0*  98.6 70.0*    Liver Enzymes  Recent Labs Lab 09/10/16 1344 09/12/16 0330 09/13/16 0322 09/14/16 0424 09/15/16 0417  AST 58* 18 18  --   --   ALT 41 28 22  --   --   ALKPHOS 87 62 57  --   --   BILITOT <0.1* 0.3 0.4  --   --   ALBUMIN 1.5* 1.3* 1.3* 1.3* 1.3*    Cardiac Enzymes  Recent Labs Lab 09/10/16 1344 09/10/16 2005 09/11/16 0347  TROPONINI 1.00* 0.41* 0.64*    Glucose  Recent Labs Lab 09/14/16 1150 09/14/16 1641 09/14/16 1940 09/14/16 2348 09/15/16 0451 09/15/16 0843  GLUCAP 214* 236* 173* 216* 177* 144*    Imaging No results found.    DISCUSSION:  Shock -resolved, ARDS - improving, needs diuresis  ASSESSMENT /  PLAN:  PULMONARY A: Acute hypoxemic respiratory failure ARDS Pulmonary edema: volume overload , left > R pleural effusion COPD without acute exacerbation  P:   Continue daily attempts at PS - seems to be tolerating for short periods only.  Needs further diuresis..  F/u CXR 6/21 Continue aggressive diuresis  PRN albuterol   CARDIOVASCULAR A:  Shock: likely septic due to peritonitis - resolved.  Demand ischemia -trop peak at 1.0 H/o HLD Tachycardia - likely r/t seizure.  Resolved.  P:  Telemetry monitoring Remains Off pressors Diuresis as above  abx complete  PRN metoprolol   RENAL A:   Hypokalemia  Hypomag  P:   Monitor lytes daily & replace as needed Strict I&O Lasix 40  q 12h Replete K, Mg PRN  Recheck BMET at 4pm   GASTROINTESTINAL A:   SBO complicated by perforation now s/p repair 6/12 Severe protein cal malnutrition  P:   NPO OGT to LIS ct TPN - while await return of bowel function Wound care per surgery - BID  HEMATOLOGIC A:   Anemia  P:  F/u CBC  Subcutaneous heparin  INFECTIOUS A:   Septic shock secondary to peritonitis  P:   Completed 8 days of meropenem and metronidazole on 6/20 Stop antibiotics today and follow clinically   ENDOCRINE A:   Relative Adrenal insufficiency (cortisol 12 at [place])   P:   Stress steroids off  CBG monitoring and SSI  NEUROLOGIC A:   Acute metabolic encephalopathy Occasional jerking movements -resolved Seizure - ?benxo withdrawal  P:   RASS goal: -1 Fentanyl gtt Scheduled versed  If recurs will need EEG, neuro   FAMILY  - Updates: no family at bedside 6/20   - Inter-disciplinary family meet or Palliative Care meeting due by:  6/20   Nickolas Madrid, NP 09/15/2016  10:01 AM Pager: [phone] or [phone]   Attending Note:  I have examined patient, reviewed labs, studies and notes. I have discussed the case with Shon Millet, and I agree with the data and plans as amended  above. 51 year old woman with a history of small bowel obstruction, complicated by perforation and peritonitis. She went for emergent laparotomy on 09/07/16 and subsequently returned to the ICU with septic shock, bilateral pulmonary infiltrates consistent with ARDS. She required aggressive volume resuscitation. Her sedation has been recently lightened and apparently she experienced seizure activity last night 6/19. She remains on scheduled benzodiazepines. On my evaluation today she is awake, interacting, tolerating pressure support 10. Lungs are coarse bilaterally. Abdomen is mildly diffusely tender with hypoactive bowel sounds. Heart regular without a murmur. We will continue her aggressive diuresis as blood pressure and renal  function will tolerate. We will continue pressure support but suspect that she will not be able to be extubated until more volume is removed. She completes her antibiotics today and we will stop these, follow clinically. No further evidence of seizure activity. Continue her current benzos. If any further evidence of seizure activity then we will obtain a neurology consult, EEG, MRI her brain. Independent critical care time is 35 minutes.   Baltazar Apo, MD, PhD 09/15/2016, 2:08 PM [facility] [phone] or if no answer [phone]

## 2016-09-15 NOTE — Progress Notes (Signed)
[facility] ADULT ICU REPLACEMENT PROTOCOL FOR AM LAB REPLACEMENT ONLY  The patient does apply for the [facility] Adult ICU Electrolyte Replacment Protocol based on the criteria listed below:   1. Is GFR >/= 40 ml/min? Yes.    Patient's GFR today is >60 2. Is urine output >/= 0.5 ml/kg/hr for the last 6 hours? Yes.   Patient's UOP is 3.83 ml/kg/hr 3. Is BUN < 60 mg/dL? Yes.    Patient's BUN today is 23 4. Abnormal electrolyte K+ =3.0 5. Ordered repletion with: per protocol 6. If a panic level lab has been reported, has the CCM MD in charge been notified? Yes.  .   Physician: Kerby Moors, Alicha Raspberry Parker 09/15/2016 6:01 AM

## 2016-09-15 NOTE — Progress Notes (Signed)
Patient ID: Brandi Leonard, female   DOB: 12/22/1965, 51 y.o.   MRN: [id number]  [facility] Surgery Progress Note     Subjective: CC- vent Patient awake and alert on vent. Nods appropriately to questions. Feels like she may need to have a BM.  Objective: Vital signs in last 24 hours: Temp:  [97.8 F (36.6 C)-98.5 F (36.9 C)] 98.5 F (36.9 C) (06/20 0845) Pulse Rate:  [54-131] 64 (06/20 1000) Resp:  [16-29] 20 (06/20 1000) BP: (101-172)/(44-82) 107/54 (06/20 1000) SpO2:  [90 %-100 %] 91 % (06/20 1000) Arterial Line BP: (98-201)/(40-74) 137/45 (06/20 1000) FiO2 (%):  [40 %] 40 % (06/20 0821) Last BM Date:  (PTA)  Intake/Output from previous day: 06/19 0701 - 06/20 0700 In: 2978 [I.V.:2044.6; NG/GT:30; IV Piggyback:903.3] Out: 5450 [Urine:4850; Emesis/NG output:450; Drains:150] Intake/Output this shift: Total I/O In: 551.5 [I.V.:371.5; NG/GT:30; IV Piggyback:150] Out: 300 [Urine:300]  PE: Gen:  Alert, NAD, on vent HEENT: EOM's intact, pupils equal  Card:  RRR, no M/G/R heard Pulm:  Effort normal, on vent, coarse breath sounds bilaterally Abd: Soft, NT, mild distension, hypoactive BS, midline incisions with vac in place  Lab Results:   Recent Labs  09/14/16 0424 09/14/16 2221 09/15/16 0417  WBC 22.5*  --  21.9*  HGB 9.3* 11.2* 9.4*  HCT 29.7* 33.0* 30.2*  PLT 221  --  322   BMET  Recent Labs  09/14/16 2210 09/14/16 2221 09/15/16 0417  NA 139 140 141  K 3.9 3.6 3.0*  CL 104 101 103  CO2 24  --  31  GLUCOSE 188* 201* 178*  BUN 22* 24* 23*  CREATININE 0.45 0.30* 0.37*  CALCIUM 7.5*  --  7.2*   PT/INR No results for input(s): LABPROT, INR in the last 72 hours. CMP     Component Value Date/Time   NA 141 09/15/2016 0417   NA 138 03/11/2012 0326   K 3.0 (L) 09/15/2016 0417   K 3.6 03/11/2012 0326   CL 103 09/15/2016 0417   CL 106 03/11/2012 0326   CO2 31 09/15/2016 0417   CO2 25 03/11/2012 0326   GLUCOSE 178 (H) 09/15/2016 0417   GLUCOSE  97 03/11/2012 0326   BUN 23 (H) 09/15/2016 0417   BUN 4 (L) 03/11/2012 0326   CREATININE 0.37 (L) 09/15/2016 0417   CREATININE 1.83 (H) 03/15/2012 0449   CALCIUM 7.2 (L) 09/15/2016 0417   CALCIUM 8.1 (L) 03/11/2012 0326   PROT 3.7 (L) 09/13/2016 0322   PROT 8.1 03/05/2012 1834   ALBUMIN 1.3 (L) 09/15/2016 0417   ALBUMIN 4.7 03/05/2012 1834   AST 18 09/13/2016 0322   AST 19 03/05/2012 1834   ALT 22 09/13/2016 0322   ALT 20 03/05/2012 1834   ALKPHOS 57 09/13/2016 0322   ALKPHOS 146 (H) 03/05/2012 1834   BILITOT 0.4 09/13/2016 0322   BILITOT 0.6 03/05/2012 1834   GFRNONAA >60 09/15/2016 0417   GFRNONAA 33 (L) 03/15/2012 0449   GFRAA >60 09/15/2016 0417   GFRAA 38 (L) 03/15/2012 0449   Lipase     Component Value Date/Time   LIPASE 69 (L) 03/05/2012 1834       Studies/Results: No results found.  Anti-infectives: Anti-infectives    Start     Dose/Rate Route Frequency Ordered Stop   09/11/16 1300  metroNIDAZOLE (FLAGYL) IVPB 500 mg     500 mg 100 mL/hr over 60 Minutes Intravenous Every 8 hours 09/11/16 1208     09/11/16 1300  meropenem (MERREM)  1 g in sodium chloride 0.9 % 100 mL IVPB     1 g 200 mL/hr over 30 Minutes Intravenous Every 8 hours 09/11/16 1259         Assessment/Plan Sepsis ARDS - vent per CCM Severe protein calorie malnutrition - TPN Seizure  SBO with perforation S/P Ex lap, 09/07/16, [facility] - Dr. Brantley Stage - await return of bowel function, continue TNA to LIWS. Patient feels like she may need to have a BM - continue midline wound vac, changes MWF - continue IV abx for intraabdominal infection   LOS: 5 days    Jerrye Beavers , [facility] Surgery 09/15/2016, 10:22 AM Pager: [phone] Consults: [phone] Mon-Fri 7:00 am-4:30 pm Sat-Sun 7:00 am-11:30 am

## 2016-09-15 NOTE — Progress Notes (Signed)
Patient having grand mal seizure. CCM notified. Seizure lasted about 30 seconds-80minute long. Patient tachycardic in 130s and hypertensive with SBP of 200 after. See PAs progress notes for orders obtained.

## 2016-09-16 ENCOUNTER — Inpatient Hospital Stay (HOSPITAL_COMMUNITY): Payer: BLUE CROSS/BLUE SHIELD

## 2016-09-16 DIAGNOSIS — J810 Acute pulmonary edema: Secondary | ICD-10-CM

## 2016-09-16 DIAGNOSIS — G9340 Encephalopathy, unspecified: Secondary | ICD-10-CM

## 2016-09-16 LAB — CBC WITH DIFFERENTIAL/PLATELET
BASOS ABS: 0 10*3/uL (ref 0.0–0.1)
BASOS PCT: 0 %
EOS ABS: 0.1 10*3/uL (ref 0.0–0.7)
Eosinophils Relative: 1 %
HCT: 29.4 % — ABNORMAL LOW (ref 36.0–46.0)
HEMOGLOBIN: 9.1 g/dL — AB (ref 12.0–15.0)
LYMPHS ABS: 1.7 10*3/uL (ref 0.7–4.0)
Lymphocytes Relative: 9 %
MCH: 24.7 pg — ABNORMAL LOW (ref 26.0–34.0)
MCHC: 31 g/dL (ref 30.0–36.0)
MCV: 79.7 fL (ref 78.0–100.0)
Monocytes Absolute: 1.2 10*3/uL — ABNORMAL HIGH (ref 0.1–1.0)
Monocytes Relative: 6 %
NEUTROS PCT: 84 %
Neutro Abs: 17.1 10*3/uL — ABNORMAL HIGH (ref 1.7–7.7)
Platelets: 451 10*3/uL — ABNORMAL HIGH (ref 150–400)
RBC: 3.69 MIL/uL — AB (ref 3.87–5.11)
RDW: 17.3 % — ABNORMAL HIGH (ref 11.5–15.5)
WBC: 20 10*3/uL — AB (ref 4.0–10.5)

## 2016-09-16 LAB — COMPREHENSIVE METABOLIC PANEL
ALBUMIN: 1.4 g/dL — AB (ref 3.5–5.0)
ALT: 19 U/L (ref 14–54)
ANION GAP: 4 — AB (ref 5–15)
AST: 17 U/L (ref 15–41)
Alkaline Phosphatase: 59 U/L (ref 38–126)
BUN: 26 mg/dL — ABNORMAL HIGH (ref 6–20)
CO2: 30 mmol/L (ref 22–32)
Calcium: 7.5 mg/dL — ABNORMAL LOW (ref 8.9–10.3)
Chloride: 103 mmol/L (ref 101–111)
Creatinine, Ser: 0.41 mg/dL — ABNORMAL LOW (ref 0.44–1.00)
GFR calc Af Amer: >60 mL/min (ref >60)
GFR calc non Af Amer: >60 mL/min (ref >60)
GLUCOSE: 194 mg/dL — AB (ref 65–99)
POTASSIUM: 3.4 mmol/L — AB (ref 3.5–5.1)
SODIUM: 137 mmol/L (ref 135–145)
Total Bilirubin: 0.2 mg/dL — ABNORMAL LOW (ref 0.3–1.2)
Total Protein: 4.4 g/dL — ABNORMAL LOW (ref 6.5–8.1)

## 2016-09-16 LAB — BASIC METABOLIC PANEL
ANION GAP: 7 (ref 5–15)
BUN: 22 mg/dL — ABNORMAL HIGH (ref 6–20)
CO2: 30 mmol/L (ref 22–32)
Calcium: 7.6 mg/dL — ABNORMAL LOW (ref 8.9–10.3)
Chloride: 102 mmol/L (ref 101–111)
Creatinine, Ser: 0.35 mg/dL — ABNORMAL LOW (ref 0.44–1.00)
GLUCOSE: 117 mg/dL — AB (ref 65–99)
Potassium: 3.3 mmol/L — ABNORMAL LOW (ref 3.5–5.1)
Sodium: 139 mmol/L (ref 135–145)

## 2016-09-16 LAB — GLUCOSE, CAPILLARY
GLUCOSE-CAPILLARY: 166 mg/dL — AB (ref 65–99)
Glucose-Capillary: 167 mg/dL — ABNORMAL HIGH (ref 65–99)
Glucose-Capillary: 164 mg/dL — ABNORMAL HIGH (ref 65–99)
Glucose-Capillary: 156 mg/dL — ABNORMAL HIGH (ref 65–99)
Glucose-Capillary: 133 mg/dL — ABNORMAL HIGH (ref 65–99)

## 2016-09-16 LAB — PHOSPHORUS: Phosphorus: 3.5 mg/dL (ref 2.5–4.6)

## 2016-09-16 LAB — PROCALCITONIN: Procalcitonin: 0.51 ng/mL

## 2016-09-16 LAB — MAGNESIUM: Magnesium: 1.5 mg/dL — ABNORMAL LOW (ref 1.7–2.4)

## 2016-09-16 MED ORDER — SODIUM CHLORIDE 0.9 % IV SOLN: INTRAVENOUS | Status: DC

## 2016-09-16 MED ORDER — LORAZEPAM 2 MG/ML IJ SOLN
1.0000 mg | INTRAMUSCULAR | Status: DC
  Administered 2016-09-16 – 2016-09-17 (×7): 1 mg via INTRAVENOUS
  Filled 2016-09-16 (×7): qty 1

## 2016-09-16 MED ORDER — VITAL AF 1.2 CAL PO LIQD
1000.0000 mL | ORAL | Status: DC
  Administered 2016-09-16: 1000 mL

## 2016-09-16 MED ORDER — POTASSIUM CHLORIDE 10 MEQ/50ML IV SOLN
10.0000 meq | INTRAVENOUS | Status: AC
  Administered 2016-09-16 (×2): 10 meq via INTRAVENOUS
  Filled 2016-09-16 (×2): qty 50

## 2016-09-16 MED ORDER — FENTANYL CITRATE (PF) 100 MCG/2ML IJ SOLN
50.0000 ug | INTRAMUSCULAR | Status: DC | PRN
  Administered 2016-09-16: 100 ug via INTRAVENOUS
  Administered 2016-09-16: 50 ug via INTRAVENOUS
  Administered 2016-09-17: 100 ug via INTRAVENOUS
  Administered 2016-09-17: 50 ug via INTRAVENOUS
  Administered 2016-09-19 – 2016-09-20 (×2): 100 ug via INTRAVENOUS
  Filled 2016-09-16 (×6): qty 2

## 2016-09-16 MED ORDER — MAGNESIUM SULFATE 4 GM/100ML IV SOLN
4.0000 g | Freq: Once | INTRAVENOUS | Status: AC
  Administered 2016-09-16: 4 g via INTRAVENOUS
  Filled 2016-09-16: qty 100

## 2016-09-16 MED ORDER — M.V.I. ADULT IV INJ
INJECTION | INTRAVENOUS | Status: AC
  Administered 2016-09-16: 18:00:00 via INTRAVENOUS
  Filled 2016-09-16: qty 1992

## 2016-09-16 NOTE — Progress Notes (Signed)
Nutrition Consult / Follow-up  DOCUMENTATION CODES:   Not applicable  INTERVENTION:    Continue trickle TF, change to Vital AF 1.2 at 20 ml/h. Provides 480 ml, 576 kcal, 36 gm protein, 389 ml free water daily.  Recommend slow advancement of TF by 10 ml every 8 hours to goal rate of 65 ml/h to provide 1872 kcal, 117 gm protein, 1265 ml free water daily.  NUTRITION DIAGNOSIS:   Malnutrition (Severe) related to  (SBO w/ perferation s/p SBR) as evidenced by severe fluid accumulation, energy intake < or equal to 50% for > or equal to 5 days.  Ongoing  GOAL:   Patient will meet greater than or equal to 90% of their needs  Met with TPN + TF  MONITOR:   Labs, Vent status, I & O's, Diet advancement, Skin, Weight trends  REASON FOR ASSESSMENT:   Consult Enteral/tube feeding initiation and management  ASSESSMENT:   51 yo female with no PMH who was admitted to [facility] on 6/11 with SBO, s/p emergent laparotomy 6/12 for perforation with peritonitis. She developed ARDS and septic shock after surgery. Transferred to [facility] on 6/15 for further care.  Patient was extubated this morning. She reports no stomach pain.   Nutrition-Focused physical exam completed. Findings are no fat depletion, no muscle depletion, and moderate edema.   Received MD Consult for TF initiation and management. Trickle TF was started yesterday; currently receiving Vital High Protein via NGT at 20 ml/h (480 ml/day) to provide 480 kcals, 42 gm protein, 401 ml free water daily. Tolerating well so far.   Also receiving TPN with Clinimix E 5/15 @ 83 ml/hr without lipids. Provides 1992 ml, 1414 kcal, and 100 grams protein per day. Meets 79% minimum re-estimated energy needs and 100% minimum re-estimated protein needs.  Total intake 1894 kcal (100% of estimated needs) and 142 gm protein (> 100% of estimated needs)  Labs reviewed: potassium 134 (L) CBG's: 167-166 Medications reviewed and include Lasix and magnesium  sulfate.  Diet Order:  Diet NPO time specified .TPN (CLINIMIX-E) Adult .TPN (CLINIMIX-E) Adult  Skin:  Wound (see comment) ( Full thickness midline post-op wound)  Last BM:  6/20  Height:   Ht Readings from Last 1 Encounters:  09/10/16 '5\' 5"'  (1.651 m)    Weight:   Wt Readings from Last 1 Encounters:  09/16/16 142 lb 6.7 oz (64.6 kg)    Ideal Body Weight:  56.82 kg  BMI:  Body mass index is 23.7 kg/m.  Estimated Nutritional Needs:   Kcal:  1800-2000  Protein:  95-110 gm  Fluid:  1.8-2 L  EDUCATION NEEDS:   No education needs identified at this time  Molli Barrows, [place], [place], [place] Pager [phone] After Hours Pager [phone]

## 2016-09-16 NOTE — Procedures (Signed)
Extubation Procedure Note  Patient Details:   Name: Brandi Leonard DOB: 1965/08/04 MRN: [id number]   Airway Documentation:  Airway 7.5 mm (Active)  Secured at (cm) 21 cm 09/16/2016  7:40 AM  Measured From Lips 09/16/2016  7:40 AM  Carmi 09/16/2016  7:40 AM  Secured By [employer] 09/16/2016  7:40 AM  Tube Holder Repositioned Yes 09/16/2016  7:40 AM  Cuff Pressure (cm H2O) 22 cm H2O 09/16/2016  7:40 AM  Site Condition Dry 09/16/2016  4:44 AM    Evaluation  O2 sats: stable throughout and currently acceptable Complications: No apparent complications Patient did tolerate procedure well. Bilateral Breath Sounds: Clear, Diminished   Yes  Miquel Dunn 09/16/2016, 8:57 AM

## 2016-09-16 NOTE — Progress Notes (Signed)
Fentanyl 50 cc wasted in sink. Witnessed by Heide Guile.

## 2016-09-16 NOTE — Progress Notes (Signed)
[facility]/Trauma Progress Note      Subjective:  CC: On vent  Patient on vent but able to nod head yes or no to questions. Patient denies abdominal pain at rest, denies nausea, denies flatus.  Objective: Vital signs in last 24 hours: Temp:  [98.1 F (36.7 C)-99.7 F (37.6 C)] 98.9 F (37.2 C) (06/21 0808) Pulse Rate:  [64-96] 96 (06/21 0700) Resp:  [17-33] 23 (06/21 0700) BP: (102-120)/(52-71) 109/59 (06/21 0700) SpO2:  [91 %-99 %] 96 % (06/21 0740) Arterial Line BP: (109-169)/(40-67) 121/52 (06/21 0700) FiO2 (%):  [40 %] 40 % (06/21 0800) Weight:  [140 lb 6.9 oz (63.7 kg)-142 lb 6.7 oz (64.6 kg)] 142 lb 6.7 oz (64.6 kg) (06/21 0442) Last BM Date: 09/15/16  Intake/Output from previous day: 06/20 0701 - 06/21 0700 In: 3493.2 [I.V.:2259.2; NG/GT:374; IV Piggyback:850] Out: 4800 [Urine:4800] Intake/Output this shift: Total I/O In: 128 [I.V.:88; Other:10; NG/GT:20; IV Piggyback:10] Out: 300 [Urine:300]  PE: Gen:  Alert, NAD, on vent HEENT: pupils equal, conjunctiva normal, no scleral icterus Card:  RRR, no M/G/R heard Pulm:  Effort normal, on vent, Abd: Soft, no distention, hypoactive BS, midline incisions with vac in place, mild generalized TTP Skin: warm and dry  Lab Results:   Recent Labs  09/15/16 0417 09/16/16 0350  WBC 21.9* 20.0*  HGB 9.4* 9.1*  HCT 30.2* 29.4*  PLT 322 451*   BMET  Recent Labs  09/15/16 1906 09/16/16 0350  NA 139 137  K 3.4* 3.4*  CL 104 103  CO2 28 30  GLUCOSE 151* 194*  BUN 23* 26*  CREATININE 0.40* 0.41*  CALCIUM 7.5* 7.5*   PT/INR No results for input(s): LABPROT, INR in the last 72 hours. CMP     Component Value Date/Time   NA 137 09/16/2016 0350   NA 138 03/11/2012 0326   K 3.4 (L) 09/16/2016 0350   K 3.6 03/11/2012 0326   CL 103 09/16/2016 0350   CL 106 03/11/2012 0326   CO2 30 09/16/2016 0350   CO2 25 03/11/2012 0326   GLUCOSE 194 (H) 09/16/2016 0350   GLUCOSE 97 03/11/2012 0326   BUN 26  (H) 09/16/2016 0350   BUN 4 (L) 03/11/2012 0326   CREATININE 0.41 (L) 09/16/2016 0350   CREATININE 1.83 (H) 03/15/2012 0449   CALCIUM 7.5 (L) 09/16/2016 0350   CALCIUM 8.1 (L) 03/11/2012 0326   PROT 4.4 (L) 09/16/2016 0350   PROT 8.1 03/05/2012 1834   ALBUMIN 1.4 (L) 09/16/2016 0350   ALBUMIN 4.7 03/05/2012 1834   AST 17 09/16/2016 0350   AST 19 03/05/2012 1834   ALT 19 09/16/2016 0350   ALT 20 03/05/2012 1834   ALKPHOS 59 09/16/2016 0350   ALKPHOS 146 (H) 03/05/2012 1834   BILITOT 0.2 (L) 09/16/2016 0350   BILITOT 0.6 03/05/2012 1834   GFRNONAA >60 09/16/2016 0350   GFRNONAA 33 (L) 03/15/2012 0449   GFRAA >60 09/16/2016 0350   GFRAA 38 (L) 03/15/2012 0449   Lipase     Component Value Date/Time   LIPASE 69 (L) 03/05/2012 1834    Studies/Results: Dg Chest Portable 1 View  Result Date: 09/16/2016 CLINICAL DATA:  Initial evaluation for acute respiratory failure, shortness of breath. EXAM: PORTABLE CHEST 1 VIEW COMPARISON:  Prior radiograph from 09/13/2016. FINDINGS: Endotracheal tube in place with tip positioned 3.1 cm above the carina. Enteric tube overlies the stomach. Right IJ approach centra venous catheter in place with tip overlying the mid SVC, stable. Cardiac and mediastinal  silhouettes unchanged. Lungs are hypoinflated. Persistent bibasilar opacities, which may reflect atelectasis and/ or infiltrates. Probable small left pleural effusion. Previously seen pulmonary edema is improved. No pneumothorax. Osseous structures unchanged. IMPRESSION: 1. Support apparatus in satisfactory position as above. 2. Shallow lung inflation with persistent bibasilar opacities, which may reflect atelectasis and/or infiltrates. 3. Persistent small left pleural effusion. 4. Improved pulmonary vascular congestion/edema as compared to previous. Electronically Signed   By: Jeannine Boga M.D.   On: 09/16/2016 06:41    Anti-infectives: Anti-infectives    Start     Dose/Rate Route Frequency  Ordered Stop   09/11/16 1300  metroNIDAZOLE (FLAGYL) IVPB 500 mg     500 mg 100 mL/hr over 60 Minutes Intravenous Every 8 hours 09/11/16 1208     09/11/16 1300  meropenem (MERREM) 1 g in sodium chloride 0.9 % 100 mL IVPB     1 g 200 mL/hr over 30 Minutes Intravenous Every 8 hours 09/11/16 1259         Assessment/Plan Sepsis ARDS - vent per CCM Severe protein calorie malnutrition - TPN Seizure  SBO with perforation S/P Ex lap, 09/07/16, [facility] - Dr. Brantley Stage - Continual trickle tube feeds as we await return of bowel function - continue midline wound vac, changes MWF - continue IV abx for intraabdominal infection  We will continue to follow.   LOS: 6 days    Kalman Drape , [facility] Surgery 09/16/2016, 8:38 AM Pager: [phone] Consults: [phone] Mon-Fri 7:00 am-4:30 pm Sat-Sun 7:00 am-11:30 am

## 2016-09-16 NOTE — Progress Notes (Signed)
PULMONARY / CRITICAL CARE MEDICINE   Name: Brandi Leonard MRN: [id number] DOB: Apr 11, 1965    ADMISSION DATE:  09/10/2016 CONSULTATION DATE:  09/10/2016  REFERRING MD:  Dr. [facility]  CHIEF COMPLAINT:  shock  HISTORY OF PRESENT ILLNESS:   51 year old admitted to [facility] 6/11 with Small bowel obstruction, underwent emergent laparotomy on 6/12 for perforation with peritonitis. Failed extubation postoperatively and then developed diffuse bilateral infiltrates consistent with ARDS and septic shock requiring Levophed  STUDIES:  CT abdomen 6/11 > multiple dilated loops of fluid filled small bowel consistent with mechanical small bowel obstruction Echo 6/16 >> nml LV fn, mild apical pericardial effusion  CULTURES: Blood 6/12 > Coag neg staph 1/2 bottles BC x 2 6/15>>> NEG  ANTIBIOTICS: Primaxin 6/12 > 6/21 Levaquin 6/12 >> 6/12 Flagyl 6/12 > 6/21  SIGNIFICANT EVENTS: 6/11 admit for SBO 6/12 perf to OR for repair 6/13 contnue shock, resp failure 6/15 transfer to [facility] for ICU admit. 6/16 started diuresis  LINES/TUBES: ETT 6/13 >6/21 R fem CVL 6/12 >6/15 Art line rt radial 6/15 > 6/21 IJ CVL 6/15>>>   SUBJECTIVE:  Weaning well on PS 5/5 this am.     VITAL SIGNS: BP (!) 109/59   Pulse 96   Temp 98.9 F (37.2 C) (Oral)   Resp (!) 23   Ht 5\' 5"  (1.651 m)   Wt 64.6 kg (142 lb 6.7 oz)   LMP 09/16/2016   SpO2 96%   BMI 23.70 kg/m   HEMODYNAMICS:    VENTILATOR SETTINGS: Vent Mode: PSV;CPAP FiO2 (%):  [40 %] 40 % Set Rate:  [18 bmp] 18 bmp Vt Set:  [460 mL] 460 mL PEEP:  [5 cmH20] 5 cmH20 Pressure Support:  [5 cmH20-10 cmH20] 5 cmH20 Plateau Pressure:  [14 cmH20-15 cmH20] 14 cmH20  INTAKE / OUTPUT: I/O last 3 completed shifts: In: 4846.2 [I.V.:3262.2; Other:10; NG/GT:374; IV Piggyback:1200] Out: 4098 Clementius.Curia; Emesis/NG output:300; Drains:50]  PHYSICAL EXAMINATION: General:   Acutely ill appearing female, NAD  HEENT: MM pink/moist,  ETT Neuro:  Awake, nods appropriately, answers questions post extubation, follows all commands  CV: s1s2 rrr, no m/r/g PULM: even/non-labored on PS 5/5, Vt 800, scattered rhonchi, diminished bases  GI: large midline surgical wound, soft, mildly tender, -bs Extremities: warm/dry, anascarca  Skin: no rashes or lesions   LABS:  BMET  Recent Labs Lab 09/15/16 0417 09/15/16 1906 09/16/16 0350  NA 141 139 137  K 3.0* 3.4* 3.4*  CL 103 104 103  CO2 31 28 30   BUN 23* 23* 26*  CREATININE 0.37* 0.40* 0.41*  GLUCOSE 178* 151* 194*    Electrolytes  Recent Labs Lab 09/14/16 0424  09/14/16 2210 09/15/16 0417 09/15/16 1906 09/16/16 0350  CALCIUM 7.2*  < > 7.5* 7.2* 7.5* 7.5*  MG 1.6*  < > 1.8 2.1  --  1.5*  PHOS 2.6  --   --  3.1  --  3.5  < > = values in this interval not displayed.  CBC  Recent Labs Lab 09/14/16 0424 09/14/16 2221 09/15/16 0417 09/16/16 0350  WBC 22.5*  --  21.9* 20.0*  HGB 9.3* 11.2* 9.4* 9.1*  HCT 29.7* 33.0* 30.2* 29.4*  PLT 221  --  322 451*    Coag's  Recent Labs Lab 09/10/16 1344  INR 1.65    Sepsis Markers  Recent Labs Lab 09/10/16 1344  PROCALCITON 13.61    ABG  Recent Labs Lab 09/11/16 1740 09/12/16 0422 09/13/16 0354  PHART 7.445 7.427 7.514*  PCO2ART 42.9 43.3 42.7  PO2ART 82.0* 98.6 70.0*    Liver Enzymes  Recent Labs Lab 09/12/16 0330 09/13/16 0322 09/14/16 0424 09/15/16 0417 09/16/16 0350  AST 18 18  --   --  17  ALT 28 22  --   --  19  ALKPHOS 62 57  --   --  59  BILITOT 0.3 0.4  --   --  0.2*  ALBUMIN 1.3* 1.3* 1.3* 1.3* 1.4*    Cardiac Enzymes  Recent Labs Lab 09/10/16 1344 09/10/16 2005 09/11/16 0347  TROPONINI 1.00* 0.41* 0.64*    Glucose  Recent Labs Lab 09/15/16 1224 09/15/16 1612 09/15/16 2043 09/15/16 2309 09/16/16 0344 09/16/16 0806  GLUCAP 160* 171* 143* 147* 167* 166*    Imaging Dg Chest Portable 1 View  Result Date: 09/16/2016 CLINICAL DATA:  Initial evaluation  for acute respiratory failure, shortness of breath. EXAM: PORTABLE CHEST 1 VIEW COMPARISON:  Prior radiograph from 09/13/2016. FINDINGS: Endotracheal tube in place with tip positioned 3.1 cm above the carina. Enteric tube overlies the stomach. Right IJ approach centra venous catheter in place with tip overlying the mid SVC, stable. Cardiac and mediastinal silhouettes unchanged. Lungs are hypoinflated. Persistent bibasilar opacities, which may reflect atelectasis and/ or infiltrates. Probable small left pleural effusion. Previously seen pulmonary edema is improved. No pneumothorax. Osseous structures unchanged. IMPRESSION: 1. Support apparatus in satisfactory position as above. 2. Shallow lung inflation with persistent bibasilar opacities, which may reflect atelectasis and/or infiltrates. 3. Persistent small left pleural effusion. 4. Improved pulmonary vascular congestion/edema as compared to previous. Electronically Signed   By: Jeannine Boga M.D.   On: 09/16/2016 06:41      DISCUSSION:  Shock -resolved, ARDS - improving, needs diuresis  ASSESSMENT / PLAN:  PULMONARY A: Acute hypoxemic respiratory failure ARDS Pulmonary edema: volume overload , left > R pleural effusion. Improving  COPD without acute exacerbation  P:   Extubate  Pulmonary hygiene  Intermittent f/u CXR  Mobilize as able  Continue gentle diuresis today then re-assess  PRN albuterol   CARDIOVASCULAR A:  Shock: likely septic due to peritonitis - resolved.  Demand ischemia -trop peak at 1.0 H/o HLD Tachycardia - likely r/t seizure.  Resolved.  P:  Telemetry monitoring Diuresis as above  abx complete  PRN metoprolol   RENAL A:   Hypokalemia  Hypomag P:   Monitor lytes daily & replace as needed Strict I&O Lasix 40  q 12h x 2 doses then stop  Replete K, Mg PRN  Recheck BMET this pm   GASTROINTESTINAL A:   SBO complicated by perforation now s/p repair 6/12 Severe protein cal malnutrition  P:    NPO OGT to LIS ct TPN and trickle feeds - await return of bowel function Wound care per surgery - BID PPI   HEMATOLOGIC A:   Anemia P:  F/u CBC  Subcutaneous heparin  INFECTIOUS A:   Septic shock secondary to peritonitis P:   Completed 8 days of meropenem and metronidazole on 6/21 D/c abx and follow clinically  Follow pct    ENDOCRINE A:   Relative Adrenal insufficiency (cortisol 12 at [place])   P:   Stress steroids off  CBG monitoring and SSI  NEUROLOGIC A:   Acute metabolic encephalopathy Occasional jerking movements -resolved Seizure - ?benxo withdrawal  Post op pain  P:   PRN fentanyl  Will change scheduled versed to ativan 1mg  q4 hours - wean as able (takes scheduled klonopin at home) If seizure recurs will  need EEG, MRI, neuro   FAMILY  - Updates: no family at bedside 6/21   - Inter-disciplinary family meet or Palliative Care meeting due by:  6/20  Cc time 30 mins   Nickolas Madrid, NP 09/16/2016  9:10 AM Pager: [phone] or [phone]  Attending Note:  51 year old female post op from multiple abdominal surgeries.  On exam, patient is weaning well with coarse lungs.  I reviewed CXR mylself, pulmonary edema and ETT in good position noted.  Discussed with PCCM-NP.  Will proceed with extubation, continue abx, titrate O2 for sat of 88-92%.  Diet per surgery.  Ambulate.  PT.  Hold of SLP until surgery clears.  Hold in the ICU overnight.  The patient is critically ill with multiple organ systems failure and requires high complexity decision making for assessment and support, frequent evaluation and titration of therapies, application of advanced monitoring technologies and extensive interpretation of multiple databases.   Critical Care Time devoted to patient care services described in this note is  35  Minutes. This time reflects time of care of this signee Dr Jennet Maduro. This critical care time does not reflect procedure time, or teaching time  or supervisory time of PA/NP/Med student/Med Resident etc but could involve care discussion time.  Rush Farmer, M.D. [facility] Pulmonary/Critical Care Medicine. Pager: [phone]. After hours pager: [phone].

## 2016-09-16 NOTE — Progress Notes (Signed)
[facility] ADULT ICU REPLACEMENT PROTOCOL FOR AM LAB REPLACEMENT ONLY  The patient does apply for the [facility] Adult ICU Electrolyte Replacment Protocol based on the criteria listed below:   1. Is GFR >/= 40 ml/min? Yes.    Patient's GFR today is >60 2. Is urine output >/= 0.5 ml/kg/hr for the last 6 hours? Yes.   Patient's UOP is 3.4 ml/kg/hr 3. Is BUN < 60 mg/dL? Yes.    Patient's BUN today is 26 4. Abnormal electrolyte(s): Potassium 3.4 5. Ordered repletion with: Potassium per Protocol 6. If a panic level lab has been reported, has the CCM MD in charge been notified? No..   Physician:    Conley Canal P 09/16/2016 5:20 AM

## 2016-09-16 NOTE — Care Management Note (Signed)
Case Management Note Marvetta Gibbons RN, BSN Unit 2W-Case Manager--2H coverage [phone]  Patient Details  Name: Brandi Leonard MRN: [id number] Date of Birth: [birth year]  Subjective/Objective:  Pt admitted to [name] with Small bowel obstruction, underwent emergent laparotomy on 6/12 for perforation with peritonitis.  6/15- tx to [facility] with ARDS and septic shock- on vent-  TPN, [place],                  Action/Plan: PTA pt lived at home- CM to follow for d/c needs  Expected Discharge Date:                  Expected Discharge Plan:     In-House Referral:     Discharge planning Services  CM Consult  Post Acute Care Choice:    Choice offered to:     DME Arranged:    DME Agency:     HH Arranged:    HH Agency:     Status of Service:  In process, will continue to follow  If discussed at Long Length of Stay Meetings, dates discussed:    Discharge Disposition:   Additional Comments: 09/16/2016 Pt transferred to ICU after failingvextubation postoperatively and then developed diffuse bilateral infiltrates consistent with ARDS and septic shock requiring Levophed Maryclare Labrador, RN 09/16/2016, 3:06 PM

## 2016-09-16 NOTE — Progress Notes (Signed)
PHARMACY - ADULT TOTAL PARENTERAL NUTRITION CONSULT NOTE   Pharmacy Consult:  TPN Indication:  SBO   Patient Measurements: Height: 5\' 5"  (165.1 cm) Weight: 142 lb 6.7 oz (64.6 kg) IBW/kg (Calculated) : 57 TPN AdjBW (KG): 60.7 Body mass index is 23.7 kg/m.  Weight at [facility]: 64 kg  Assessment:  20 YOF admitted to [place] on 09/06/16 with SBO and underwent emergent laparotomy for perforation on 09/07/16.  Post-operatively, patient developed ARDS and septic shock.  Patient received 25L of IVF prior to transfer to [facility] on 09/10/16.  Pharmacy consulted to manage TPN for nutritional support while awaiting return of bowel function.  Unable to assess nutritional intake PTA; however, patient has severe malnutrition per RD assessment.  GI: prealbumin 6.2 >> 8.9, NG O/P 240mL - PPI IV LBM 6/20 Endo: no hx DM - CBGs improving - but still getting fair amount of SSI Insulin requirements in the past 24 hours: 22 units SSI and 20 units in TPN Lytes: Mild hypokalemia - K+ 3.4 - replacing with 60meq (ordered)  Hypomagnesemia - replacing with 4 gm bolus. All other lytes WNL.  Corrected calcium for low albumin - 9.4 Renal: SCr 0.41, BUN 26.  [facility]L at [facility], UOP 2.1 ml/kg/hr, Neg. I/O (Lasix - getting 40mg  q12) Pulm: COPD.  Intubated, FiO2 40% Cards: demand ischemia with troponin 0.64, EF 50-55%, VSS - Lasix 40mg  IV Q12H started 6/17 - now tachy - added Metoprolol Hepatobil: LFTs / tbili.  TG mildly elevated at 168 Neuro: anxiety.  Had grand mal seizure - ? Benzo withdrawal - added scheduled midazolam, Fent. at 71mcg/hr ID: Merrem/Flagyl from PTA for peritonitis and ?PNA - afebrile, WBC up 21.9, PCT 13.61, TA cx grew C.[email or web address] Best Practices: heparin SQ, MC, PPI IV TPN Access: triple lumen R-IJ 09/10/16 TPN start date: 09/11/16  Nutritional Goals (per RD rec 6/16): 1500-1600 kCal and 110-130 gm protein per day  Current Nutrition:  TPN - adding TF (Vital HP at 78ml/hr) - provides 480kcal and 42 gm  protein   Plan:  - Continue Clinimix E 5/15 at 83 ml/hr.  TPN provides 1414 kCal and 100gm of protein per day, meeting > 90% of total need.  Will adjust down as TF are increased and tolerated - Magnesium 4 gm IV bolus x1 - Hold ILE for the first 7 days of TPN per SCCM/ASPEN guidelines (start date 09/18/16) - Daily multivitamin in TPN - Trace elements in TPN every other day d/t shortage, next dose 6/23 - Continue moderate SSI Q4H + increase to 30 units regular insulin in TPN - F/U AM labs, CBGs  Rober Minion, PharmD., MS Clinical Pharmacist Pager:  [phone] Thank you for allowing pharmacy to be part of this patients care team. 09/16/2016, 8:55 AM

## 2016-09-17 DIAGNOSIS — F13239 Sedative, hypnotic or anxiolytic dependence with withdrawal, unspecified: Secondary | ICD-10-CM

## 2016-09-17 LAB — RENAL FUNCTION PANEL
ALBUMIN: 1.5 g/dL — AB (ref 3.5–5.0)
Anion gap: 7 (ref 5–15)
BUN: 22 mg/dL — AB (ref 6–20)
CHLORIDE: 103 mmol/L (ref 101–111)
CO2: 28 mmol/L (ref 22–32)
Calcium: 7.7 mg/dL — ABNORMAL LOW (ref 8.9–10.3)
Creatinine, Ser: 0.42 mg/dL — ABNORMAL LOW (ref 0.44–1.00)
GFR calc Af Amer: >60 mL/min (ref >60)
GFR calc non Af Amer: >60 mL/min (ref >60)
GLUCOSE: 164 mg/dL — AB (ref 65–99)
Phosphorus: 3.4 mg/dL (ref 2.5–4.6)
Potassium: 3.1 mmol/L — ABNORMAL LOW (ref 3.5–5.1)
Sodium: 138 mmol/L (ref 135–145)

## 2016-09-17 LAB — GLUCOSE, CAPILLARY
GLUCOSE-CAPILLARY: 130 mg/dL — AB (ref 65–99)
GLUCOSE-CAPILLARY: 157 mg/dL — AB (ref 65–99)
GLUCOSE-CAPILLARY: 141 mg/dL — AB (ref 65–99)
Glucose-Capillary: 160 mg/dL — ABNORMAL HIGH (ref 65–99)
Glucose-Capillary: 163 mg/dL — ABNORMAL HIGH (ref 65–99)
Glucose-Capillary: 174 mg/dL — ABNORMAL HIGH (ref 65–99)

## 2016-09-17 LAB — CBC WITH DIFFERENTIAL/PLATELET
Basophils Absolute: 0 10*3/uL (ref 0.0–0.1)
Basophils Relative: 0 %
EOS PCT: 0 %
Eosinophils Absolute: 0.1 10*3/uL (ref 0.0–0.7)
HEMATOCRIT: 29.2 % — AB (ref 36.0–46.0)
HEMOGLOBIN: 9.3 g/dL — AB (ref 12.0–15.0)
LYMPHS ABS: 1.8 10*3/uL (ref 0.7–4.0)
LYMPHS PCT: 8 %
MCH: 25.7 pg — ABNORMAL LOW (ref 26.0–34.0)
MCHC: 31.8 g/dL (ref 30.0–36.0)
MCV: 80.7 fL (ref 78.0–100.0)
Monocytes Absolute: 1.4 10*3/uL — ABNORMAL HIGH (ref 0.1–1.0)
Monocytes Relative: 6 %
NEUTROS ABS: 18.6 10*3/uL — AB (ref 1.7–7.7)
Neutrophils Relative %: 86 %
PLATELETS: 633 10*3/uL — AB (ref 150–400)
RBC: 3.62 MIL/uL — AB (ref 3.87–5.11)
RDW: 17.6 % — ABNORMAL HIGH (ref 11.5–15.5)
WBC: 21.9 10*3/uL — AB (ref 4.0–10.5)

## 2016-09-17 LAB — MAGNESIUM: Magnesium: 1.7 mg/dL (ref 1.7–2.4)

## 2016-09-17 LAB — C DIFFICILE QUICK SCREEN W PCR REFLEX
C DIFFICILE (CDIFF) INTERP: NOT DETECTED
C DIFFICILE (CDIFF) TOXIN: NEGATIVE
C DIFFICLE (CDIFF) ANTIGEN: NEGATIVE

## 2016-09-17 LAB — PROCALCITONIN: Procalcitonin: 0.4 ng/mL

## 2016-09-17 MED ORDER — METHOCARBAMOL 1000 MG/10ML IJ SOLN
500.0000 mg | Freq: Four times a day (QID) | INTRAVENOUS | Status: DC | PRN
  Administered 2016-09-17: 500 mg via INTRAVENOUS
  Filled 2016-09-17 (×2): qty 5

## 2016-09-17 MED ORDER — M.V.I. ADULT IV INJ
INTRAVENOUS | Status: AC
  Administered 2016-09-17: 18:00:00 via INTRAVENOUS
  Filled 2016-09-17: qty 960

## 2016-09-17 MED ORDER — POTASSIUM CHLORIDE 20 MEQ/15ML (10%) PO SOLN
40.0000 meq | Freq: Three times a day (TID) | ORAL | Status: AC
  Administered 2016-09-17 (×2): 40 meq
  Filled 2016-09-17 (×2): qty 30

## 2016-09-17 MED ORDER — ASPIRIN 81 MG PO CHEW
162.0000 mg | CHEWABLE_TABLET | Freq: Every day | ORAL | Status: DC
  Administered 2016-09-18 – 2016-09-19 (×2): 162 mg via ORAL
  Filled 2016-09-17 (×2): qty 2

## 2016-09-17 MED ORDER — VITAL AF 1.2 CAL PO LIQD
1000.0000 mL | ORAL | Status: DC
  Administered 2016-09-17 – 2016-09-20 (×5): 1000 mL
  Filled 2016-09-17: qty 1000

## 2016-09-17 MED ORDER — POTASSIUM CHLORIDE 10 MEQ/50ML IV SOLN
10.0000 meq | INTRAVENOUS | Status: AC
  Administered 2016-09-17 (×4): 10 meq via INTRAVENOUS
  Filled 2016-09-17 (×4): qty 50

## 2016-09-17 MED ORDER — MAGNESIUM SULFATE 2 GM/50ML IV SOLN
2.0000 g | Freq: Once | INTRAVENOUS | Status: AC
  Administered 2016-09-17: 2 g via INTRAVENOUS
  Filled 2016-09-17: qty 50

## 2016-09-17 MED ORDER — CLONAZEPAM 0.5 MG PO TABS
0.2500 mg | ORAL_TABLET | Freq: Two times a day (BID) | ORAL | Status: DC
  Administered 2016-09-17 – 2016-09-19 (×5): 0.25 mg
  Filled 2016-09-17 (×5): qty 1

## 2016-09-17 MED ORDER — KETOROLAC TROMETHAMINE 15 MG/ML IJ SOLN
7.5000 mg | Freq: Four times a day (QID) | INTRAMUSCULAR | Status: DC | PRN
  Administered 2016-09-17 – 2016-09-18 (×2): 7.5 mg via INTRAVENOUS
  Filled 2016-09-17 (×4): qty 1

## 2016-09-17 MED ORDER — POTASSIUM CHLORIDE 10 MEQ/50ML IV SOLN: 10.0000 meq | INTRAVENOUS | Status: DC

## 2016-09-17 MED ORDER — ORAL CARE MOUTH RINSE
15.0000 mL | Freq: Two times a day (BID) | OROMUCOSAL | Status: DC
  Administered 2016-09-17 – 2016-09-24 (×14): 15 mL via OROMUCOSAL

## 2016-09-17 NOTE — Progress Notes (Signed)
Nutrition Consult / Follow-up  DOCUMENTATION CODES:   Not applicable  INTERVENTION:    Increase Vital AF 1.2 by 10 ml every 4 hours to goal rate of 65 ml/h to provide 1872 kcal, 117 gm protein, 1265 ml free water daily.  Wean TPN per Pharmacy.  NUTRITION DIAGNOSIS:   Malnutrition (Severe) related to  (SBO w/ perferation s/p SBR) as evidenced by severe fluid accumulation, energy intake < or equal to 50% for > or equal to 5 days.  Ongoing  GOAL:   Patient will meet greater than or equal to 90% of their needs  Met with TPN + TF  MONITOR:   Labs, Vent status, I & O's, Diet advancement, Skin, Weight trends  REASON FOR ASSESSMENT:   Consult Enteral/tube feeding initiation and management  ASSESSMENT:   51 yo female with no PMH who was admitted to [facility] on 6/11 with SBO, s/p emergent laparotomy 6/12 for perforation with peritonitis. She developed ARDS and septic shock after surgery. Transferred to [facility] on 6/15 for further care.  Patient has been tolerating trickle TF well. Received MD Consult for TF advancement.  Patient is receiving TPN with Clinimix E 5/15 @ 83 ml/hr without lipids. Provides 1992 ml, 1414 kcal, and 100 grams protein per day. Meets 79% minimum estimated energy needs and 100% minimum estimated protein needs.  Labs reviewed: potassium 3.1 (L) CBG's: 174-130-163 Medications reviewed and include KCl and prn Zofran  Diet Order:  Diet NPO time specified .TPN (CLINIMIX-E) Adult  Skin:  Wound (see comment) (full thickness post op wound with VAC)  Last BM:  6/21  Height:   Ht Readings from Last 1 Encounters:  09/10/16 _0  (1.651 m)    Weight:   Wt Readings from Last 1 Encounters:  09/17/16 128 lb 15.5 oz (58.5 kg)    Ideal Body Weight:  56.82 kg  BMI:  Body mass index is 21.46 kg/m.  Estimated Nutritional Needs:   Kcal:  1800-2000  Protein:  95-110 gm  Fluid:  1.8-2 L  EDUCATION NEEDS:   No education needs identified at this  time  Molli Barrows, [place], [place], [place] Pager [phone] After Hours Pager [phone]

## 2016-09-17 NOTE — Progress Notes (Signed)
PULMONARY / CRITICAL CARE MEDICINE   Name: LAURIA DEPOY MRN: [id number] DOB: 1965/06/08    ADMISSION DATE:  09/10/2016 CONSULTATION DATE:  09/10/2016  REFERRING MD:  Dr. [facility]  CHIEF COMPLAINT:  shock  HISTORY OF PRESENT ILLNESS:   51 year old admitted to [facility] 6/11 with Small bowel obstruction, underwent emergent laparotomy on 6/12 for perforation with peritonitis. Failed extubation postoperatively and then developed diffuse bilateral infiltrates consistent with ARDS and septic shock requiring Levophed  STUDIES:  CT abdomen 6/11 > multiple dilated loops of fluid filled small bowel consistent with mechanical small bowel obstruction Echo 6/16 >> nml LV fn, mild apical pericardial effusion  CULTURES: Blood 6/12 > Coag neg staph 1/2 bottles BC x 2 6/15>>> NEG  ANTIBIOTICS: Primaxin 6/12 > 6/21 Levaquin 6/12 >> 6/12 Flagyl 6/12 > 6/21  SIGNIFICANT EVENTS: 6/11 admit for SBO 6/12 perf to OR for repair 6/13 contnue shock, resp failure 6/15 transfer to [facility] for ICU admit. 6/16 started diuresis  LINES/TUBES: ETT 6/13 >6/21 R fem CVL 6/12 >6/15 Art line rt radial 6/15 > 6/21 IJ CVL 6/15>>>  SUBJECTIVE:  Confused this AM but following commands and cooperative.  VITAL SIGNS: BP 111/66   Pulse (!) 112   Temp 99.5 F (37.5 C) (Oral)   Resp (!) 33   Ht 5\' 5"  (1.651 m)   Wt 128 lb 15.5 oz (58.5 kg)   LMP 09/16/2016   SpO2 95%   BMI 21.46 kg/m   HEMODYNAMICS:    VENTILATOR SETTINGS:    INTAKE / OUTPUT: I/O last 3 completed shifts: In: 4131.1 [I.V.:2836.1; NG/GT:635; IV Piggyback:660] Out: 2683 [Urine:7325]  PHYSICAL EXAMINATION: General:   Acutely ill appearing female, NAD  HEENT: MM pink/moist, [place]/AT, PERRL, EOM-I Neuro:  Awake, nods appropriately, answers questions post extubation, follows all commands  CV: RRR, Nl S1/S2, -M/R/G PULM: even/non-labored on PS 5/5, Vt 800, scattered rhonchi, diminished bases  GI: large midline surgical  wound, soft, mildly tender, -bs Extremities: warm/dry, anascarca  Skin: no rashes or lesions  LABS:  BMET  Recent Labs Lab 09/16/16 0350 09/16/16 1913 09/17/16 0454  NA 137 139 138  K 3.4* 3.3* 3.1*  CL 103 102 103  CO2 30 30 28   BUN 26* 22* 22*  CREATININE 0.41* 0.35* 0.42*  GLUCOSE 194* 117* 164*   Electrolytes  Recent Labs Lab 09/15/16 0417  09/16/16 0350 09/16/16 1913 09/17/16 0454  CALCIUM 7.2*  < > 7.5* 7.6* 7.7*  MG 2.1  --  1.5*  --  1.7  PHOS 3.1  --  3.5  --  3.4  < > = values in this interval not displayed.  CBC  Recent Labs Lab 09/15/16 0417 09/16/16 0350 09/17/16 0454  WBC 21.9* 20.0* 21.9*  HGB 9.4* 9.1* 9.3*  HCT 30.2* 29.4* 29.2*  PLT 322 451* 633*   Coag's  Recent Labs Lab 09/10/16 1344  INR 1.65   Sepsis Markers  Recent Labs Lab 09/10/16 1344 09/16/16 0945 09/17/16 0454  PROCALCITON 13.61 0.51 0.40   ABG  Recent Labs Lab 09/11/16 1740 09/12/16 0422 09/13/16 0354  PHART 7.445 7.427 7.514*  PCO2ART 42.9 43.3 42.7  PO2ART 82.0* 98.6 70.0*   Liver Enzymes  Recent Labs Lab 09/12/16 0330 09/13/16 0322  09/15/16 0417 09/16/16 0350 09/17/16 0454  AST 18 18  --   --  17  --   ALT 28 22  --   --  19  --   ALKPHOS 62 57  --   --  59  --   BILITOT 0.3 0.4  --   --  0.2*  --   ALBUMIN 1.3* 1.3*  < > 1.3* 1.4* 1.5*  < > = values in this interval not displayed.  Cardiac Enzymes  Recent Labs Lab 09/10/16 1344 09/10/16 2005 09/11/16 0347  TROPONINI 1.00* 0.41* 0.64*   Glucose  Recent Labs Lab 09/16/16 1232 09/16/16 1610 09/16/16 2050 09/17/16 0013 09/17/16 0319 09/17/16 0819  GLUCAP 164* 156* 133* 174* 130* 163*    Imaging Dg Abd Portable 1v  Result Date: 09/16/2016 CLINICAL DATA:  Encounter for nasogastric tube placement EXAM: PORTABLE ABDOMEN - 1 VIEW COMPARISON:  09/07/2016 FINDINGS: Nasogastric tube is in place, tip overlying the level of the distal stomach. Visualized bowel gas pattern is  nonobstructive. There is dense opacification at the left lung base. IMPRESSION: 1. Nasogastric tube to the distal stomach. 2. Left lower lobe atelectasis or infiltrate. Electronically Signed   By: Nolon Nations M.D.   On: 09/16/2016 20:41   I reviewed CXR myself, no acute disease noted.  DISCUSSION:  Shock -resolved, ARDS - improving, needs diuresis  ASSESSMENT / PLAN:  PULMONARY A: Acute hypoxemic respiratory failure ARDS Pulmonary edema: volume overload , left > R pleural effusion. Improving  COPD without acute exacerbation  P:   Pulmonary hygiene  Mobilize as able  PRN albuterol Titrate O2 for sat of 88-92  CARDIOVASCULAR A:  Shock: likely septic due to peritonitis - resolved.  Demand ischemia -trop peak at 1.0 H/o HLD Tachycardia - likely r/t seizure.  Resolved.  P:  Telemetry monitoring Hold further diureses Abx complete  PRN metoprolol   RENAL A:   Hypokalemia  Hypomag P:   Monitor lytes daily & replace as needed Strict I&O KCl 40 meq PO Recheck BMET this pm   GASTROINTESTINAL A:   SBO complicated by perforation now s/p repair 6/12 Severe protein cal malnutrition  P:   NPO OGT to LIS Continue TPN and trickle feeds - await return of bowel function Wound care per surgery - BID PPI   HEMATOLOGIC A:   Anemia P:  F/u CBC  Subcutaneous heparin  INFECTIOUS A:   Septic shock secondary to peritonitis P:   Completed 8 days of meropenem and metronidazole on 6/21 D/c abx and follow clinically  Follow pct   ENDOCRINE A:   Relative Adrenal insufficiency (cortisol 12 at [place])   P:   Stress steroids off  CBG monitoring and SSI  NEUROLOGIC A:   Acute metabolic encephalopathy Occasional jerking movements -resolved Seizure - ?benxo withdrawal  Post op pain  P:   PRN fentanyl  Klonipin 0.25 BID PO If seizure recurs will need EEG, MRI, neuro   FAMILY  - Updates: no family at bedside 6/21   - Inter-disciplinary family meet or  Palliative Care meeting due by:  6/20  Discussed with TRH-MD and PCCM-NP, transfer to SDU and to [facility] service with PCCM off 6/23.  Rush Farmer, M.D. [facility] Pulmonary/Critical Care Medicine. Pager: [phone]. After hours pager: [phone].

## 2016-09-17 NOTE — Progress Notes (Signed)
PHARMACY - ADULT TOTAL PARENTERAL NUTRITION CONSULT NOTE   Pharmacy Consult:  TPN Indication:  SBO   Patient Measurements: Height: 5\' 5"  (165.1 cm) Weight: 128 lb 15.5 oz (58.5 kg) IBW/kg (Calculated) : 57 TPN AdjBW (KG): 60.7 Body mass index is 21.46 kg/m.  Weight at [facility]: 64 kg  Assessment:  87 YOF admitted to [place] on 09/06/16 with SBO and underwent emergent laparotomy for perforation on 09/07/16.  Post-operatively, patient developed ARDS and septic shock.  Patient received 25L of IVF prior to transfer to [facility] on 09/10/16.  Pharmacy consulted to manage TPN for nutritional support while awaiting return of bowel function.  Unable to assess nutritional intake PTA; however, patient has severe malnutrition per RD assessment.  GI: prealbumin 6.2 >> 8.9, NG O/P 435 mL - PPI IV, no flatus, last BM 6/21 Endo: no hx DM - CBGs improved  Insulin requirements in the past 24 hours: 16 units SSI and 30 units in TPN Lytes: Mild hypokalemia - K 3.1 - replaced per MD, Mg 1.7 - replaced per MD. All other lytes WNL.  Corrected calcium for low albumin - 9.7 Renal: SCr 0.42, BUN 26.  [facility]L at [facility], UOP 3.7 ml/kg/hr, net -10L (s/p 10 doses furosemide) Pulm: COPD. Extubated 6/21 Cards: demand ischemia with troponin 0.64, EF 50-55%, BP wnl, tachy - metoprolol, asa PR Hepatobil: LFTs / tbili.  TG mildly elevated at 168 Neuro: anxiety.  Had grand mal seizure - ? Benzo withdrawal ID: s/p Merrem/Flagyl for peritonitis and ?PNA - Tm 101.4, WBC up 21.9, PCT 0.4<<13.61, TA cx grew C.albicans Best Practices: heparin SQ, MC, PPI IV TPN Access: triple lumen R-IJ 09/10/16 TPN start date: 09/11/16  Nutritional Goals (per RD rec 6/21): 1800-2000 kCal and 95-110 gm protein per day  Current Nutrition:  TPN TF (Vital AF at 20 ml/hr) - provides 480 ml, 576 kcal and 36 gm protein; advancing to 65 ml/hr as tolerated (provides 1560 ml, 1872 kcal and 117 gm protein)  Plan:  - Decrease Clinimix E 5/15 to 40  ml/hr.  TPN provides 682 kCal and 48 gm of protein per day. TF to be increased to at least 30 ml/hr today which would provide 864 kcal and 54 gm protein, TPN + TF to meet 85% kcal and 100% protein needs - Hold ILE for the first 7 days of TPN per SCCM/ASPEN guidelines (start date 09/18/16) - Daily multivitamin in TPN - Trace elements in TPN every other day d/t shortage, next dose 6/23 - Continue moderate SSI Q4H + continue 30 units regular insulin in TPN, F/U CBGs - F/U am labs  Thank you for allowing pharmacy to be part of this patients care team.  Renold Genta, PharmD, BCPS Clinical Pharmacist Phone for today - [place] - [phone] 09/17/2016 8:17 AM

## 2016-09-17 NOTE — Progress Notes (Signed)
Pt had 6 stools in last 8 hours. Dr Ashok Cordia notified. Orders received

## 2016-09-17 NOTE — Progress Notes (Signed)
eLink Physician-Brief Progress Note Patient Name: Brandi Leonard DOB: 1965/08/20 MRN: [id number]   Date of Service  09/17/2016  HPI/Events of Note  Patient with 4 loose bowel movements today. Currently on trickle tube feedings. Just completed course of broad-spectrum antibiotics for abdominal peritonitis from ruptured viscus. Febrile in the last 24 hours.   eICU Interventions  1. Checking stool C. difficile 2. Inserting rectal fecal management system      Intervention Category Major Interventions: Sepsis - evaluation and management  Tera Partridge 09/17/2016, 4:45 PM

## 2016-09-17 NOTE — Evaluation (Signed)
Clinical/Bedside Swallow Evaluation Patient Details  Name: Brandi Leonard MRN: [id number] Date of Birth: 1965/10/09  Today's Date: 09/17/2016 Time: SLP Start Time (ACUTE ONLY): 1500 SLP Stop Time (ACUTE ONLY): 1530 SLP Time Calculation (min) (ACUTE ONLY): 30 min  Past Medical History: No past medical history on file. Past Surgical History: No past surgical history on file. HPI:  51 year old admitted to [facility] 6/11 with Small bowel obstruction, underwent emergent laparotomy on 6/12 for perforation with peritonitis. Failed extubation postoperatively and then developed diffuse bilateral infiltrates consistent with ARDS and septic shock requiring Levophed   Assessment / Plan / Recommendation Clinical Impression   Pt presents with strong voice and volitional cough but increased respiratory rate at rest which at bedside appears to impact airway protection during the swallow as evidenced by throat clearing intermittently with PO intake. Nectar thick liquids and purees were tolerated well without overt s/s of aspiration; however, pt also is lethargic and has overt cognitive impairments which need to be taken into consideration as part of safe initiation of PO diet.   For now, recommend that pt remain NPO with ice chips for comfort after oral care.  Prognosis for initiation of PO diet is good with increased time post extubation.  SLP will follow up for readiness.    SLP Visit Diagnosis: Dysphagia, oropharyngeal phase (R13.12)    Aspiration Risk  Moderate aspiration risk    Diet Recommendation Ice chips PRN after oral care   Medication Administration: Via alternative means    Other  Recommendations Oral Care Recommendations: Oral care QID   Follow up Recommendations  (TBD)      Frequency and Duration min 3x week  1 week       Prognosis Prognosis for Safe Diet Advancement: Fair Barriers to Reach Goals: Cognitive deficits      Swallow Study   General Date of Onset: 09/08/16 HPI:  51 year old admitted to [facility] 6/11 with Small bowel obstruction, underwent emergent laparotomy on 6/12 for perforation with peritonitis. Failed extubation postoperatively and then developed diffuse bilateral infiltrates consistent with ARDS and septic shock requiring Levophed Type of Study: Bedside Swallow Evaluation Previous Swallow Assessment: none on record Diet Prior to this Study: NPO Temperature Spikes Noted: Yes Respiratory Status: Nasal cannula History of Recent Intubation: Yes Length of Intubations (days): 8 days (6/13-6/21) Date extubated: 09/16/16 Behavior/Cognition: Confused;Cooperative;Lethargic/Drowsy Oral Cavity Assessment: Within Functional Limits Oral Care Completed by SLP: Yes Oral Cavity - Dentition: Adequate natural dentition Vision: Functional for self-feeding Self-Feeding Abilities: Able to feed self;Needs assist Patient Positioning: Upright in bed Baseline Vocal Quality: Normal Volitional Cough: Strong Volitional Swallow: Able to elicit    Oral/Motor/Sensory Function Overall Oral Motor/Sensory Function: Within functional limits   Ice Chips Ice chips: Impaired Presentation: Spoon Pharyngeal Phase Impairments: Throat Clearing - Immediate   Thin Liquid Thin Liquid: Impaired Presentation: Cup Pharyngeal  Phase Impairments: Throat Clearing - Immediate    Nectar Thick Nectar Thick Liquid: Within functional limits   Honey Thick     Puree Puree: Within functional limits   Solid   GO   Solid: Impaired Oral Phase Functional Implications: Impaired mastication Pharyngeal Phase Impairments: Throat Clearing - Immediate        Beverely Suen, Selinda Orion 09/17/2016,3:36 PM

## 2016-09-17 NOTE — Progress Notes (Signed)
[facility]/Trauma Progress Note      Subjective:  CC: abdominal pain and back pain  Pt states she cannot get up cause her back hurts. She is having mild abdominal pain. No nausea. Pt states no flatus. Pt had a BM yesterday. PT is oriented to person but not place or time.   Objective: Vital signs in last 24 hours: Temp:  [97.2 F (36.2 C)-101.4 F (38.6 C)] 99.1 F (37.3 C) (06/22 0319) Pulse Rate:  [80-116] 105 (06/22 0600) Resp:  [17-57] 27 (06/22 0600) BP: (99-125)/(50-74) 109/67 (06/22 0600) SpO2:  [90 %-100 %] 96 % (06/22 0600) Arterial Line BP: (53-128)/(29-55) 53/29 (06/21 1600) FiO2 (%):  [40 %] 40 % (06/21 0800) Weight:  [128 lb 15.5 oz (58.5 kg)] 128 lb 15.5 oz (58.5 kg) (06/22 0600) Last BM Date: 09/16/16  Intake/Output from previous day: 06/21 0701 - 06/22 0700 In: 2378.1 [I.V.:1803.1; NG/GT:415; IV Piggyback:160] Out: 5125 [Urine:5125] Intake/Output this shift: No intake/output data recorded.  PE: Gen: Alert, NAD, pleasant, cooperative HEENT: pupils equal, conjunctiva normal, no scleral icterus Card: RRR, no M/G/R heard Pulm: rate and effort normal Abd: Soft, no distention, + BS, midline incisions with vac in place, mild generalized TTP Skin: warm and dry Psych: alert and oriented to person but not place or time  Lab Results:   Recent Labs  09/16/16 0350 09/17/16 0454  WBC 20.0* 21.9*  HGB 9.1* 9.3*  HCT 29.4* 29.2*  PLT 451* 633*   BMET  Recent Labs  09/16/16 1913 09/17/16 0454  NA 139 138  K 3.3* 3.1*  CL 102 103  CO2 30 28  GLUCOSE 117* 164*  BUN 22* 22*  CREATININE 0.35* 0.42*  CALCIUM 7.6* 7.7*   PT/INR No results for input(s): LABPROT, INR in the last 72 hours. CMP     Component Value Date/Time   NA 138 09/17/2016 0454   NA 138 03/11/2012 0326   K 3.1 (L) 09/17/2016 0454   K 3.6 03/11/2012 0326   CL 103 09/17/2016 0454   CL 106 03/11/2012 0326   CO2 28 09/17/2016 0454   CO2 25 03/11/2012 0326   GLUCOSE  164 (H) 09/17/2016 0454   GLUCOSE 97 03/11/2012 0326   BUN 22 (H) 09/17/2016 0454   BUN 4 (L) 03/11/2012 0326   CREATININE 0.42 (L) 09/17/2016 0454   CREATININE 1.83 (H) 03/15/2012 0449   CALCIUM 7.7 (L) 09/17/2016 0454   CALCIUM 8.1 (L) 03/11/2012 0326   PROT 4.4 (L) 09/16/2016 0350   PROT 8.1 03/05/2012 1834   ALBUMIN 1.5 (L) 09/17/2016 0454   ALBUMIN 4.7 03/05/2012 1834   AST 17 09/16/2016 0350   AST 19 03/05/2012 1834   ALT 19 09/16/2016 0350   ALT 20 03/05/2012 1834   ALKPHOS 59 09/16/2016 0350   ALKPHOS 146 (H) 03/05/2012 1834   BILITOT 0.2 (L) 09/16/2016 0350   BILITOT 0.6 03/05/2012 1834   GFRNONAA >60 09/17/2016 0454   GFRNONAA 33 (L) 03/15/2012 0449   GFRAA >60 09/17/2016 0454   GFRAA 38 (L) 03/15/2012 0449   Lipase     Component Value Date/Time   LIPASE 69 (L) 03/05/2012 1834    Studies/Results: Dg Chest Portable 1 View  Result Date: 09/16/2016 CLINICAL DATA:  Initial evaluation for acute respiratory failure, shortness of breath. EXAM: PORTABLE CHEST 1 VIEW COMPARISON:  Prior radiograph from 09/13/2016. FINDINGS: Endotracheal tube in place with tip positioned 3.1 cm above the carina. Enteric tube overlies the stomach. Right IJ approach centra venous  catheter in place with tip overlying the mid SVC, stable. Cardiac and mediastinal silhouettes unchanged. Lungs are hypoinflated. Persistent bibasilar opacities, which may reflect atelectasis and/ or infiltrates. Probable small left pleural effusion. Previously seen pulmonary edema is improved. No pneumothorax. Osseous structures unchanged. IMPRESSION: 1. Support apparatus in satisfactory position as above. 2. Shallow lung inflation with persistent bibasilar opacities, which may reflect atelectasis and/or infiltrates. 3. Persistent small left pleural effusion. 4. Improved pulmonary vascular congestion/edema as compared to previous. Electronically Signed   By: Jeannine Boga M.D.   On: 09/16/2016 06:41   Dg Abd Portable  1v  Result Date: 09/16/2016 CLINICAL DATA:  Encounter for nasogastric tube placement EXAM: PORTABLE ABDOMEN - 1 VIEW COMPARISON:  09/07/2016 FINDINGS: Nasogastric tube is in place, tip overlying the level of the distal stomach. Visualized bowel gas pattern is nonobstructive. There is dense opacification at the left lung base. IMPRESSION: 1. Nasogastric tube to the distal stomach. 2. Left lower lobe atelectasis or infiltrate. Electronically Signed   By: Nolon Nations M.D.   On: 09/16/2016 20:41    Anti-infectives: Anti-infectives    Start     Dose/Rate Route Frequency Ordered Stop   09/11/16 1300  metroNIDAZOLE (FLAGYL) IVPB 500 mg  Status:  Discontinued     500 mg 100 mL/hr over 60 Minutes Intravenous Every 8 hours 09/11/16 1208 09/16/16 0912   09/11/16 1300  meropenem (MERREM) 1 g in sodium chloride 0.9 % 100 mL IVPB  Status:  Discontinued     1 g 200 mL/hr over 30 Minutes Intravenous Every 8 hours 09/11/16 1259 09/16/16 0912       Assessment/Plan Sepsis ARDS - extubated, management per CCM Severe protein calorie malnutrition  Seizure  SBO with perforation S/P Ex lap, 09/07/16, [facility] - Dr. Brantley Stage - tube feeds @ 57mL/hr - continue midline wound vac, changes MWF - continue IV abx for intraabdominal infection  Plan: added robaxin and Toradol for back pain. Advance tube feeds as tolerated to goal and wean TPN per protocol.    LOS: 7 days    Kalman Drape , [facility] 09/17/2016, 7:55 AM Pager: [phone] Consults: [phone] Mon-Fri 7:00 am-4:30 pm Sat-Sun 7:00 am-11:30 am

## 2016-09-17 NOTE — Progress Notes (Signed)
[facility] ADULT ICU REPLACEMENT PROTOCOL FOR AM LAB REPLACEMENT ONLY  The patient does apply for the [facility] Adult ICU Electrolyte Replacment Protocol based on the criteria listed below:   1. Is GFR >/= 40 ml/min? Yes.    Patient's GFR today is >60 2. Is urine output >/= 0.5 ml/kg/hr for the last 6 hours? Yes.   Patient's UOP is 2.48 ml/kg/hr 3. Is BUN < 60 mg/dL? Yes.    Patient's BUN today is 22 4. Abnormal electrolyte(s): Potassium 3.1 5. Ordered repletion with: Potassium per protocol 6. If a panic level lab has been reported, has the CCM MD in charge been notified? No..   Physician:    Adam Phenix 09/17/2016 6:06 AM

## 2016-09-18 ENCOUNTER — Encounter (HOSPITAL_COMMUNITY): Payer: Self-pay | Admitting: Family Medicine

## 2016-09-18 DIAGNOSIS — E43 Unspecified severe protein-calorie malnutrition: Secondary | ICD-10-CM | POA: Diagnosis present

## 2016-09-18 DIAGNOSIS — K659 Peritonitis, unspecified: Secondary | ICD-10-CM | POA: Diagnosis present

## 2016-09-18 DIAGNOSIS — R197 Diarrhea, unspecified: Secondary | ICD-10-CM

## 2016-09-18 DIAGNOSIS — D72829 Elevated white blood cell count, unspecified: Secondary | ICD-10-CM | POA: Diagnosis present

## 2016-09-18 DIAGNOSIS — J80 Acute respiratory distress syndrome: Secondary | ICD-10-CM | POA: Diagnosis present

## 2016-09-18 LAB — CBC WITH DIFFERENTIAL/PLATELET
BASOS ABS: 0 10*3/uL (ref 0.0–0.1)
Basophils Relative: 0 %
EOS ABS: 0.1 10*3/uL (ref 0.0–0.7)
EOS PCT: 0 %
HCT: 30.3 % — ABNORMAL LOW (ref 36.0–46.0)
HEMOGLOBIN: 9.5 g/dL — AB (ref 12.0–15.0)
Lymphocytes Relative: 9 %
Lymphs Abs: 2 10*3/uL (ref 0.7–4.0)
MCH: 25.5 pg — ABNORMAL LOW (ref 26.0–34.0)
MCHC: 31.4 g/dL (ref 30.0–36.0)
MCV: 81.2 fL (ref 78.0–100.0)
Monocytes Absolute: 1.2 10*3/uL — ABNORMAL HIGH (ref 0.1–1.0)
Monocytes Relative: 6 %
NEUTROS PCT: 85 %
Neutro Abs: 18.4 10*3/uL — ABNORMAL HIGH (ref 1.7–7.7)
PLATELETS: 762 10*3/uL — AB (ref 150–400)
RBC: 3.73 MIL/uL — AB (ref 3.87–5.11)
RDW: 17.8 % — ABNORMAL HIGH (ref 11.5–15.5)
WBC: 21.7 10*3/uL — AB (ref 4.0–10.5)

## 2016-09-18 LAB — GLUCOSE, CAPILLARY
GLUCOSE-CAPILLARY: 123 mg/dL — AB (ref 65–99)
GLUCOSE-CAPILLARY: 142 mg/dL — AB (ref 65–99)
GLUCOSE-CAPILLARY: 150 mg/dL — AB (ref 65–99)
GLUCOSE-CAPILLARY: 170 mg/dL — AB (ref 65–99)
Glucose-Capillary: 160 mg/dL — ABNORMAL HIGH (ref 65–99)
Glucose-Capillary: 163 mg/dL — ABNORMAL HIGH (ref 65–99)

## 2016-09-18 LAB — RENAL FUNCTION PANEL
ANION GAP: 7 (ref 5–15)
Albumin: 1.7 g/dL — ABNORMAL LOW (ref 3.5–5.0)
BUN: 26 mg/dL — ABNORMAL HIGH (ref 6–20)
CALCIUM: 7.9 mg/dL — AB (ref 8.9–10.3)
CHLORIDE: 110 mmol/L (ref 101–111)
CO2: 21 mmol/L — ABNORMAL LOW (ref 22–32)
CREATININE: 0.42 mg/dL — AB (ref 0.44–1.00)
Glucose, Bld: 176 mg/dL — ABNORMAL HIGH (ref 65–99)
Phosphorus: 3.5 mg/dL (ref 2.5–4.6)
Potassium: 3.8 mmol/L (ref 3.5–5.1)
SODIUM: 138 mmol/L (ref 135–145)

## 2016-09-18 LAB — PROCALCITONIN: PROCALCITONIN: 0.26 ng/mL

## 2016-09-18 LAB — MAGNESIUM: MAGNESIUM: 1.7 mg/dL (ref 1.7–2.4)

## 2016-09-18 MED ORDER — RESOURCE THICKENUP CLEAR PO POWD
ORAL | Status: DC | PRN
  Filled 2016-09-18: qty 125

## 2016-09-18 NOTE — Progress Notes (Signed)
  Speech Language Pathology Treatment: Dysphagia  Patient Details Name: Brandi Leonard MRN: [id number] DOB: 1965/10/07 Today's Date: 09/18/2016 Time: 7096-4383 SLP Time Calculation (min) (ACUTE ONLY): 18 min  Assessment / Plan / Recommendation Clinical Impression  Pt demonstrates ongoing throat clearing and soft cough with thin liquids, but again tolerates nectar thick liquids well without signs of aspiration. She does have increased RR with large consecutive sips and reports some pain. Large NG also present at time of assessment, which could compromise swallow function. Recommend pt initiate a dys 3/nectar thick liquid diet to reduce risk of aspiration as pt recovers. Will f/u for tolerance and need for objective testing if throat clearing persists after NG tube removal.   HPI HPI: 51 year old admitted to [facility] 6/11 with Small bowel obstruction, underwent emergent laparotomy on 6/12 for perforation with peritonitis. Failed extubation postoperatively and then developed diffuse bilateral infiltrates consistent with ARDS and septic shock requiring Levophed      SLP Plan  Continue with current plan of care       Recommendations  Diet recommendations: Dysphagia 3 (mechanical soft);Nectar-thick liquid Liquids provided via: Cup;Straw Medication Administration: Whole meds with puree Supervision: Staff to assist with self feeding Compensations: Slow rate;Small sips/bites Postural Changes and/or Swallow Maneuvers: Seated upright 90 degrees                Oral Care Recommendations: Oral care BID Follow up Recommendations: Skilled Nursing facility SLP Visit Diagnosis: Dysphagia, oropharyngeal phase (R13.12) Plan: Continue with current plan of care       [place] Jaaziel Peatross, MA CCC-SLP [phone]  Brandi Leonard 09/18/2016, 11:12 AM

## 2016-09-18 NOTE — Progress Notes (Signed)
   Subjective/Chief Complaint: Diarrhea  Patient failed bedside swallow eval yesterday Tolerating tube feeds Copious diarrhea - flexiseal placed C. Diff negative Resting comfortably - conversant; awake   Objective: Vital signs in last 24 hours: Temp:  [98.1 F (36.7 C)-99 F (37.2 C)] 98.1 F (36.7 C) (06/23 0416) Pulse Rate:  [96-122] 121 (06/23 0700) Resp:  [15-39] 36 (06/23 0700) BP: (99-125)/(55-76) 117/70 (06/23 0700) SpO2:  [92 %-99 %] 92 % (06/23 0700) Weight:  [55.2 kg (121 lb 11.1 oz)] 55.2 kg (121 lb 11.1 oz) (06/23 0422) Last BM Date: 09/18/16  Intake/Output from previous day: 06/22 0701 - 06/23 0700 In: 2522 [I.V.:1157; NG/GT:1035; IV Piggyback:200] Out: 1610 [Urine:1075; Drains:50; RUEAV:4098] Intake/Output this shift: No intake/output data recorded.  General appearance: alert, cooperative and no distress Resp: clear to auscultation bilaterally Cardio: regular rate and rhythm, S1, S2 normal, no murmur, click, rub or gallop GI: soft, active bowel sounds, minimal tenderness Incision - wound VAC in place  Lab Results:   Recent Labs  09/17/16 0454 09/18/16 0430  WBC 21.9* 21.7*  HGB 9.3* 9.5*  HCT 29.2* 30.3*  PLT 633* 762*   BMET  Recent Labs  09/17/16 0454 09/18/16 0430  NA 138 138  K 3.1* 3.8  CL 103 110  CO2 28 21*  GLUCOSE 164* 176*  BUN 22* 26*  CREATININE 0.42* 0.42*  CALCIUM 7.7* 7.9*   PT/INR No results for input(s): LABPROT, INR in the last 72 hours. ABG No results for input(s): PHART, HCO3 in the last 72 hours.  Invalid input(s): PCO2, PO2  Studies/Results: Dg Abd Portable 1v  Result Date: 09/16/2016 CLINICAL DATA:  Encounter for nasogastric tube placement EXAM: PORTABLE ABDOMEN - 1 VIEW COMPARISON:  09/07/2016 FINDINGS: Nasogastric tube is in place, tip overlying the level of the distal stomach. Visualized bowel gas pattern is nonobstructive. There is dense opacification at the left lung base. IMPRESSION: 1. Nasogastric  tube to the distal stomach. 2. Left lower lobe atelectasis or infiltrate. Electronically Signed   By: Nolon Nations M.D.   On: 09/16/2016 20:41    Anti-infectives: Anti-infectives    Start     Dose/Rate Route Frequency Ordered Stop   09/11/16 1300  metroNIDAZOLE (FLAGYL) IVPB 500 mg  Status:  Discontinued     500 mg 100 mL/hr over 60 Minutes Intravenous Every 8 hours 09/11/16 1208 09/16/16 0912   09/11/16 1300  meropenem (MERREM) 1 g in sodium chloride 0.9 % 100 mL IVPB  Status:  Discontinued     1 g 200 mL/hr over 30 Minutes Intravenous Every 8 hours 09/11/16 1259 09/16/16 0912      Assessment/Plan: Sepsis ARDS - extubated, management per CCM Severe protein calorie malnutrition  Seizure  SBO with perforation S/P Ex lap, 09/07/16, [facility] - Dr. Brantley Stage - tube feeds @ 52mL/hr - continue midline wound vac, changes MWF   Plan: added robaxin and Toradol for back pain. Advance tube feeds as tolerated to goal and wean TPN per protocol  LOS: 8 days    Brandi Leonard K. 09/18/2016

## 2016-09-18 NOTE — Progress Notes (Signed)
PHARMACY - ADULT TOTAL PARENTERAL NUTRITION CONSULT NOTE   Pharmacy Consult:  TPN Indication:  SBO   Patient Measurements: Height: 5\' 5"  (165.1 cm) Weight: 121 lb 11.1 oz (55.2 kg) IBW/kg (Calculated) : 57 TPN AdjBW (KG): 60.7 Body mass index is 20.25 kg/m.  Weight at [facility]: 64 kg  Assessment:  44 YOF admitted to [facility] on 09/06/16 with SBO and underwent emergent laparotomy for perforation on 09/07/16.  Post-operatively, patient developed ARDS and septic shock.  Patient received 25L of IVF prior to transfer to [facility] on 09/10/16.  Pharmacy consulted to manage TPN for nutritional support while awaiting return of bowel function.  Unable to assess nutritional intake PTA; however, patient has severe malnutrition per RD assessment.  GI: prealbumin 6.2 >> 8.9, NG O/P 983 mL - PPI IV, diarrhea (C diff neg) - last BM 6/23, failed swallow eval 6/22 Endo: no hx DM - CBGs improved  Insulin requirements in the past 24 hours: 15 units SSI and 30 units in TPN Lytes: K 3.8, Mg 1.7. All other lytes WNL.  Corrected calcium for low albumin - 9.7 Renal: SCr 0.42, BUN 26.  Received 25L at [facility], UOP 0.8 ml/kg/hr, net -10L (s/p 10 doses furosemide) Pulm: COPD. Extubated 6/21, on 4L [place] Cards: demand ischemia with troponin 0.64, EF 50-55%, BP wnl, tachy - metoprolol, asa PR Hepatobil: LFTs / tbili.  TG mildly elevated at 168 Neuro: anxiety.  Had grand mal seizure - ? Benzo withdrawal ID: s/p Merrem/Flagyl for peritonitis and ?PNA - Tm 99, WBC 21.7, PCT 0.4<<13.61, TA cx grew C.alb[place] Best Practices: heparin SQ, MC, PPI IV TPN Access: triple lumen R-IJ 09/10/16 TPN start date: 09/11/16  Nutritional Goals (per RD rec 6/21): 1800-2000 kCal  95-110 gm protein per day  Current Nutrition:  TPN - wean off TF (Vital AF at 65 ml/hr) - provides 1560 ml, 1872 kcal and 117 gm protein  Plan:  - TF meeting 100% of kCal and protein needs - Decrease Clinimix E 5/15 to 20 ml/hr for 2 hrs then off - TPN associated  labs discontinued - Continue moderate SSI q4h - MD to adjust as needed   Thank you for allowing pharmacy to be part of this patients care team.  Renold Genta, PharmD, BCPS Clinical Pharmacist Phone for today - [place] - [phone] 09/18/2016 8:33 AM

## 2016-09-18 NOTE — Progress Notes (Signed)
PROGRESS NOTE  Brandi Leonard  GQB:[id number]  DOB: 05/26/1965  DOA: 09/10/2016 PCP: No primary care provider on file.  Brief Admission Hx: 51 year old admitted to [facility] 6/11 with Small bowel obstruction, underwent emergent laparotomy on 6/12 for perforation with peritonitis. Failed extubation postoperatively and then developed diffuse bilateral infiltrates consistent with ARDS and septic shock requiring Levophed. TRH assumed care 6/23.    SIGNIFICANT EVENTS: 6/11 admit for SBO 6/12 perf to OR for repair 6/13 contnue shock, resp failure 6/15 transfer to [facility] for ICU admit. 6/16 started diuresis  MDM/Assessment & Plan:   Acute hypoxemic respiratory failure ARDS Pulmonary edema: volume overload , left > R pleural effusion. Improving  COPD without acute exacerbation Pt has recently been improving.  Extubated now, continue pulmonary hygiene.    SBO complicated by perforation now s/p repair 6/12 Severe protein cal malnutrition NPO patient failed recent bedside swallow evaluation OGT to LIS Continue TPN and trickle feeds - await return of bowel function, tolerating tube feeds, wean TPN protocol Wound care per surgery - midline wound vac, changes MWF PPI   Anemia F/u CBC  Subcutaneous heparin  Septic shock secondary to peritonitis Completed 8 days of meropenem and metronidazole on 6/21 Discontinued all abx and follow clinically   Relative Adrenal insufficiency (cortisol 12 at [place])  Stress steroids off  CBG monitoring and SSI CBG (last 3)   Recent Labs  09/17/16 1623 09/17/16 2110 09/18/16 0005  GLUCAP 160* 141* 828*   Acute metabolic encephalopathy Occasional jerking movements -resolved Seizure - ?benzo withdrawal  Post op pain  PRN fentanyl  Klonipin 0.25 BID PO If seizure recurs will need EEG, MRI, neuro   Diarrhea - flexiseal placed, c diff studies negative  Consultants:  General surgery  Subjective: Pt having diffuse diarrhea, started  yesteday  Objective: Vitals:   09/18/16 0422 09/18/16 0500 09/18/16 0600 09/18/16 0700  BP:  119/73 123/73 117/70  Pulse:  (!) 122 (!) 115 (!) 121  Resp:  (!) 27 (!) 26 (!) 36  Temp:      TempSrc:      SpO2:  96% 96% 92%  Weight: 55.2 kg (121 lb 11.1 oz)     Height:        Intake/Output Summary (Last 24 hours) at 09/18/16 0836 Last data filed at 09/18/16 0700  Gross per 24 hour  Intake             2409 ml  Output             2475 ml  Net              -66 ml   Filed Weights   09/16/16 0442 09/17/16 0600 09/18/16 0422  Weight: 64.6 kg (142 lb 6.7 oz) 58.5 kg (128 lb 15.5 oz) 55.2 kg (121 lb 11.1 oz)     REVIEW OF SYSTEMS  As per history otherwise all reviewed and reported negative  Exam:  General appearance: alert, cooperative and no distress Resp: clear to auscultation bilaterally Cardio: regular rate and rhythm, S1, S2 normal, no murmur, click, rub or gallop GI: soft, active bowel sounds, minimal tenderness Incision - wound VAC in place  Data Reviewed: Basic Metabolic Panel:  Recent Labs Lab 09/14/16 0424  09/14/16 2210  09/15/16 0417 09/15/16 1906 09/16/16 0350 09/16/16 1913 09/17/16 0454 09/18/16 0430  NA 141  < > 139  < > 141 139 137 139 138 138  K 4.0  < > 3.9  < > 3.0* 3.4* 3.4* 3.3*  3.1* 3.8  CL 104  < > 104  < > 103 104 103 102 103 110  CO2 31  < > 24  --  31 28 30 30 28  21*  GLUCOSE 204*  < > 188*  < > 178* 151* 194* 117* 164* 176*  BUN 20  < > 22*  < > 23* 23* 26* 22* 22* 26*  CREATININE 0.45  < > 0.45  < > 0.37* 0.40* 0.41* 0.35* 0.42* 0.42*  CALCIUM 7.2*  < > 7.5*  --  7.2* 7.5* 7.5* 7.6* 7.7* 7.9*  MG 1.6*  < > 1.8  --  2.1  --  1.5*  --  1.7 1.7  PHOS 2.6  --   --   --  3.1  --  3.5  --  3.4 3.5  < > = values in this interval not displayed. Liver Function Tests:  Recent Labs Lab 09/12/16 0330 09/13/16 0322 09/14/16 0424 09/15/16 0417 09/16/16 0350 09/17/16 0454 09/18/16 0430  AST 18 18  --   --  17  --   --   ALT 28 22  --   --   19  --   --   ALKPHOS 62 57  --   --  59  --   --   BILITOT 0.3 0.4  --   --  0.2*  --   --   PROT 3.7* 3.7*  --   --  4.4*  --   --   ALBUMIN 1.3* 1.3* 1.3* 1.3* 1.4* 1.5* 1.7*   No results for input(s): LIPASE, AMYLASE in the last 168 hours. No results for input(s): AMMONIA in the last 168 hours. CBC:  Recent Labs Lab 09/14/16 0424 09/14/16 2221 09/15/16 0417 09/16/16 0350 09/17/16 0454 09/18/16 0430  WBC 22.5*  --  21.9* 20.0* 21.9* 21.7*  NEUTROABS 19.6*  --  19.4* 17.1* 18.6* 18.4*  HGB 9.3* 11.2* 9.4* 9.1* 9.3* 9.5*  HCT 29.7* 33.0* 30.2* 29.4* 29.2* 30.3*  MCV 80.3  --  79.7 79.7 80.7 81.2  PLT 221  --  322 451* 633* 762*   Cardiac Enzymes: No results for input(s): CKTOTAL, CKMB, CKMBINDEX, TROPONINI in the last 168 hours. CBG (last 3)   Recent Labs  09/17/16 1623 09/17/16 2110 09/18/16 0005  GLUCAP 160* 141* 123*   Recent Results (from the past 240 hour(s))  Urine culture     Status: None   Collection Time: 09/10/16  3:22 PM  Result Value Ref Range Status   Specimen Description URINE, CATHETERIZED  Final   Special Requests NONE  Final   Culture NO GROWTH  Final   Report Status 09/11/2016 FINAL  Final  MRSA PCR Screening     Status: None   Collection Time: 09/10/16  3:29 PM  Result Value Ref Range Status   MRSA by PCR NEGATIVE NEGATIVE Final    Comment:        The GeneXpert MRSA Assay (FDA approved for NASAL specimens only), is one component of a comprehensive MRSA colonization surveillance program. It is not intended to diagnose MRSA infection nor to guide or monitor treatment for MRSA infections.   Culture, respiratory (tracheal aspirate)     Status: None   Collection Time: 09/10/16  4:03 PM  Result Value Ref Range Status   Specimen Description TRACHEAL ASPIRATE  Final   Special Requests NONE  Final   Gram Stain   Final    MODERATE WBC PRESENT,BOTH PMN AND MONONUCLEAR RARE BUDDING YEAST  SEEN RARE GRAM NEGATIVE COCCI IN PAIRS    Culture FEW  CANDIDA ALBICANS  Final   Report Status 09/13/2016 FINAL  Final  Culture, blood (routine x 2)     Status: None   Collection Time: 09/10/16  4:05 PM  Result Value Ref Range Status   Specimen Description BLOOD RIGHT FOOT  Final   Special Requests   Final    BOTTLES DRAWN AEROBIC ONLY Blood Culture results may not be optimal due to an inadequate volume of blood received in culture bottles   Culture NO GROWTH 5 DAYS  Final   Report Status 09/15/2016 FINAL  Final  Culture, blood (routine x 2)     Status: None   Collection Time: 09/10/16  4:20 PM  Result Value Ref Range Status   Specimen Description BLOOD LEFT FOOT  Final   Special Requests IN PEDIATRIC BOTTLE Blood Culture adequate volume  Final   Culture NO GROWTH 5 DAYS  Final   Report Status 09/15/2016 FINAL  Final  C difficile quick scan w PCR reflex     Status: None   Collection Time: 09/17/16  5:25 PM  Result Value Ref Range Status   C Diff antigen NEGATIVE NEGATIVE Final   C Diff toxin NEGATIVE NEGATIVE Final   C Diff interpretation No C. difficile detected.  Final     Studies: Dg Abd Portable 1v  Result Date: 09/16/2016 CLINICAL DATA:  Encounter for nasogastric tube placement EXAM: PORTABLE ABDOMEN - 1 VIEW COMPARISON:  09/07/2016 FINDINGS: Nasogastric tube is in place, tip overlying the level of the distal stomach. Visualized bowel gas pattern is nonobstructive. There is dense opacification at the left lung base. IMPRESSION: 1. Nasogastric tube to the distal stomach. 2. Left lower lobe atelectasis or infiltrate. Electronically Signed   By: Nolon Nations M.D.   On: 09/16/2016 20:41     Scheduled Meds: . aspirin  162 mg Oral Daily  . Chlorhexidine Gluconate Cloth  6 each Topical Daily  . clonazePAM  0.25 mg Per Tube BID  . heparin  5,000 Units Subcutaneous Q8H  . insulin aspart  0-15 Units Subcutaneous Q4H  . mouth rinse  15 mL Mouth Rinse BID  . pantoprazole (PROTONIX) IV  40 mg Intravenous QHS  . sodium chloride flush   10-40 mL Intracatheter Q12H   Continuous Infusions: . Marland KitchenTPN (CLINIMIX-E) Adult 40 mL/hr at 09/17/16 1821  . sodium chloride    . feeding supplement (VITAL AF 1.2 CAL) 1,000 mL (09/18/16 0200)  . methocarbamol (ROBAXIN)  IV 500 mg (09/17/16 1722)   Active Problems:   Encounter for central line placement   Septic shock [facility])   Critical Care Time spent: 47 mins  Irwin Brakeman, MD, FAAFP [facility] Pager [phone]  If 7PM-7AM, please contact night-coverage [email or web address] Password [facility] 09/18/2016, 8:36 AM    LOS: 8 days

## 2016-09-19 ENCOUNTER — Inpatient Hospital Stay (HOSPITAL_COMMUNITY): Payer: BLUE CROSS/BLUE SHIELD

## 2016-09-19 ENCOUNTER — Encounter (HOSPITAL_COMMUNITY): Payer: Self-pay

## 2016-09-19 DIAGNOSIS — D72829 Elevated white blood cell count, unspecified: Secondary | ICD-10-CM

## 2016-09-19 LAB — CBC WITH DIFFERENTIAL/PLATELET
BASOS ABS: 0 10*3/uL (ref 0.0–0.1)
Basophils Relative: 0 %
EOS ABS: 0 10*3/uL (ref 0.0–0.7)
Eosinophils Relative: 0 %
HCT: 32 % — ABNORMAL LOW (ref 36.0–46.0)
Hemoglobin: 10 g/dL — ABNORMAL LOW (ref 12.0–15.0)
LYMPHS PCT: 10 %
Lymphs Abs: 2.5 10*3/uL (ref 0.7–4.0)
MCH: 25.1 pg — AB (ref 26.0–34.0)
MCHC: 31.3 g/dL (ref 30.0–36.0)
MCV: 80.2 fL (ref 78.0–100.0)
Monocytes Absolute: 1.5 10*3/uL — ABNORMAL HIGH (ref 0.1–1.0)
Monocytes Relative: 6 %
NEUTROS ABS: 21.1 10*3/uL — AB (ref 1.7–7.7)
NEUTROS PCT: 84 %
PLATELETS: 996 10*3/uL — AB (ref 150–400)
RBC: 3.99 MIL/uL (ref 3.87–5.11)
RDW: 17.6 % — ABNORMAL HIGH (ref 11.5–15.5)
WBC: 25.1 10*3/uL — AB (ref 4.0–10.5)

## 2016-09-19 LAB — GLUCOSE, CAPILLARY
GLUCOSE-CAPILLARY: 183 mg/dL — AB (ref 65–99)
GLUCOSE-CAPILLARY: 162 mg/dL — AB (ref 65–99)
GLUCOSE-CAPILLARY: 152 mg/dL — AB (ref 65–99)
Glucose-Capillary: 176 mg/dL — ABNORMAL HIGH (ref 65–99)
Glucose-Capillary: 172 mg/dL — ABNORMAL HIGH (ref 65–99)

## 2016-09-19 LAB — RENAL FUNCTION PANEL
ALBUMIN: 2 g/dL — AB (ref 3.5–5.0)
ANION GAP: 7 (ref 5–15)
BUN: 27 mg/dL — ABNORMAL HIGH (ref 6–20)
CO2: 17 mmol/L — ABNORMAL LOW (ref 22–32)
Calcium: 8.5 mg/dL — ABNORMAL LOW (ref 8.9–10.3)
Chloride: 116 mmol/L — ABNORMAL HIGH (ref 101–111)
Creatinine, Ser: 0.45 mg/dL (ref 0.44–1.00)
Glucose, Bld: 145 mg/dL — ABNORMAL HIGH (ref 65–99)
PHOSPHORUS: 3.7 mg/dL (ref 2.5–4.6)
POTASSIUM: 4 mmol/L (ref 3.5–5.1)
Sodium: 140 mmol/L (ref 135–145)

## 2016-09-19 LAB — POCT I-STAT 3, ART BLOOD GAS (G3+)
ACID-BASE DEFICIT: 11 mmol/L — AB (ref 0.0–2.0)
Bicarbonate: 12.1 mmol/L — ABNORMAL LOW (ref 20.0–28.0)
O2 Saturation: 97 %
PH ART: 7.384 (ref 7.350–7.450)
TCO2: 13 mmol/L (ref 0–100)
pCO2 arterial: 20.3 mmHg — ABNORMAL LOW (ref 32.0–48.0)
pO2, Arterial: 94 mmHg (ref 83.0–108.0)

## 2016-09-19 LAB — MAGNESIUM: MAGNESIUM: 1.7 mg/dL (ref 1.7–2.4)

## 2016-09-19 LAB — TROPONIN I
TROPONIN I: 0.04 ng/mL — AB (ref <0.03)
Troponin I: 0.05 ng/mL (ref <0.03)
Troponin I: 0.05 ng/mL (ref <0.03)

## 2016-09-19 LAB — D-DIMER, QUANTITATIVE: D-Dimer, Quant: 15.61 ug/mL-FEU — ABNORMAL HIGH (ref 0.00–0.50)

## 2016-09-19 LAB — LACTIC ACID, PLASMA: Lactic Acid, Venous: 1.2 mmol/L (ref 0.5–1.9)

## 2016-09-19 MED ORDER — INSULIN ASPART 100 UNIT/ML ~~LOC~~ SOLN
0.0000 [IU] | Freq: Three times a day (TID) | SUBCUTANEOUS | Status: DC
  Administered 2016-09-19 (×3): 3 [IU] via SUBCUTANEOUS
  Administered 2016-09-20: 5 [IU] via SUBCUTANEOUS
  Administered 2016-09-20: 3 [IU] via SUBCUTANEOUS
  Administered 2016-09-21 – 2016-09-26 (×3): 2 [IU] via SUBCUTANEOUS
  Administered 2016-09-27 – 2016-09-28 (×4): 3 [IU] via SUBCUTANEOUS
  Administered 2016-09-29: 8 [IU] via SUBCUTANEOUS
  Administered 2016-09-29: 2 [IU] via SUBCUTANEOUS

## 2016-09-19 MED ORDER — ACETAMINOPHEN 325 MG PO TABS
650.0000 mg | ORAL_TABLET | Freq: Four times a day (QID) | ORAL | Status: DC | PRN
  Administered 2016-09-19: 650 mg via ORAL
  Filled 2016-09-19 (×2): qty 2

## 2016-09-19 MED ORDER — ASPIRIN 81 MG PO CHEW: 162.0000 mg | CHEWABLE_TABLET | Freq: Every day | ORAL | Status: DC

## 2016-09-19 MED ORDER — SODIUM CHLORIDE 0.9 % IV BOLUS (SEPSIS)
500.0000 mL | Freq: Once | INTRAVENOUS | Status: AC
  Administered 2016-09-20: 500 mL via INTRAVENOUS

## 2016-09-19 MED ORDER — PANTOPRAZOLE SODIUM 40 MG PO TBEC: 40.0000 mg | DELAYED_RELEASE_TABLET | Freq: Every day | ORAL | Status: DC

## 2016-09-19 MED ORDER — DIPHENOXYLATE-ATROPINE 2.5-0.025 MG/5ML PO LIQD: 10.0000 mL | Freq: Four times a day (QID) | ORAL | Status: DC

## 2016-09-19 MED ORDER — FUROSEMIDE 10 MG/ML IJ SOLN
80.0000 mg | Freq: Once | INTRAMUSCULAR | Status: AC
  Administered 2016-09-19: 80 mg via INTRAVENOUS
  Filled 2016-09-19: qty 8

## 2016-09-19 MED ORDER — LORAZEPAM 0.5 MG PO TABS
0.5000 mg | ORAL_TABLET | Freq: Once | ORAL | Status: AC
Start: 2016-09-19 — End: 2016-09-19
  Administered 2016-09-19: 0.5 mg via ORAL
  Filled 2016-09-19: qty 1

## 2016-09-19 MED ORDER — CLONAZEPAM 0.5 MG PO TABS
0.5000 mg | ORAL_TABLET | Freq: Two times a day (BID) | ORAL | Status: DC
  Administered 2016-09-19: 0.5 mg via ORAL
  Filled 2016-09-19: qty 1

## 2016-09-19 MED ORDER — SODIUM CHLORIDE 0.9 % IV BOLUS (SEPSIS)
500.0000 mL | Freq: Once | INTRAVENOUS | Status: AC
  Administered 2016-09-19: 500 mL via INTRAVENOUS

## 2016-09-19 MED ORDER — SODIUM CHLORIDE 0.9 % IV SOLN
1.0000 g | Freq: Three times a day (TID) | INTRAVENOUS | Status: DC
  Administered 2016-09-19 – 2016-09-25 (×19): 1 g via INTRAVENOUS
  Filled 2016-09-19 (×21): qty 1

## 2016-09-19 MED ORDER — METOPROLOL TARTRATE 5 MG/5ML IV SOLN
5.0000 mg | Freq: Once | INTRAVENOUS | Status: AC
  Administered 2016-09-19: 5 mg via INTRAVENOUS

## 2016-09-19 MED ORDER — LOPERAMIDE HCL 1 MG/5ML PO LIQD
2.0000 mg | Freq: Two times a day (BID) | ORAL | Status: AC
  Administered 2016-09-19 (×2): 2 mg
  Filled 2016-09-19 (×2): qty 10

## 2016-09-19 MED ORDER — IPRATROPIUM-ALBUTEROL 0.5-2.5 (3) MG/3ML IN SOLN
3.0000 mL | Freq: Four times a day (QID) | RESPIRATORY_TRACT | Status: DC
  Administered 2016-09-19 (×2): 3 mL via RESPIRATORY_TRACT
  Filled 2016-09-19 (×3): qty 3

## 2016-09-19 MED ORDER — IOPAMIDOL (ISOVUE-370) INJECTION 76%
INTRAVENOUS | Status: AC
  Administered 2016-09-19: 100 mL
  Filled 2016-09-19: qty 100

## 2016-09-19 MED ORDER — ASPIRIN 81 MG PO CHEW
162.0000 mg | CHEWABLE_TABLET | Freq: Every day | ORAL | Status: DC
  Administered 2016-09-20 – 2016-09-27 (×8): 162 mg via ORAL
  Filled 2016-09-19 (×8): qty 2

## 2016-09-19 MED ORDER — SODIUM CHLORIDE 0.9 % IV SOLN
INTRAVENOUS | Status: DC
  Administered 2016-09-19: 08:00:00 via INTRAVENOUS

## 2016-09-19 MED ORDER — ACETAMINOPHEN 325 MG PO TABS
325.0000 mg | ORAL_TABLET | Freq: Once | ORAL | Status: AC
  Administered 2016-09-20: 325 mg via ORAL
  Filled 2016-09-19: qty 1

## 2016-09-19 MED ORDER — PANTOPRAZOLE SODIUM 40 MG PO PACK: 40.0000 mg | PACK | Freq: Every day | ORAL | Status: DC

## 2016-09-19 MED ORDER — IPRATROPIUM-ALBUTEROL 0.5-2.5 (3) MG/3ML IN SOLN
3.0000 mL | RESPIRATORY_TRACT | Status: DC | PRN
  Administered 2016-09-19: 3 mL via RESPIRATORY_TRACT
  Filled 2016-09-19: qty 3

## 2016-09-19 MED ORDER — ACETAMINOPHEN 325 MG PO TABS
650.0000 mg | ORAL_TABLET | ORAL | Status: DC | PRN
  Administered 2016-10-01: 650 mg via ORAL
  Filled 2016-09-19: qty 2

## 2016-09-19 NOTE — Progress Notes (Signed)
PROGRESS NOTE  Brandi Leonard  FXT:[id number]  DOB: 02/05/1966  DOA: 09/10/2016 PCP: No primary care provider on file.  Brief Admission Hx: 51 year old admitted to [facility] 6/11 with Small bowel obstruction, underwent emergent laparotomy on 6/12 for perforation with peritonitis. Failed extubation postoperatively and then developed diffuse bilateral infiltrates consistent with ARDS and septic shock requiring Levophed. TRH assumed care 6/23.    SIGNIFICANT EVENTS: 6/11 admit for SBO 6/12 perf to OR for repair 6/13 contnue shock, resp failure 6/15 transfer to [facility] for ICU admit. 6/16 started diuresis 6/23 TRH assumed care, dysphagia diet started 6/24  Acute resp distress/aspiration pneumonia  MDM/Assessment & Plan:   Acute hypoxemic respiratory failure ARDS Pulmonary edema: volume overload , left > R pleural effusion. Improving  COPD without acute exacerbation Pt had episode of respiratory distress last night, CTA suggesting aspiration pneumonia, WBC trending higher, restart meropenem  SBO complicated by perforation now s/p repair 6/12 Severe protein cal malnutrition Pt started on dys 3 diet 6/23 with nectar thick liquids Wound care per surgery - midline wound vac, changes MWF PPI changed to p.o.   Anemia F/u CBC  Subcutaneous heparin  Thrombocytosis - ? If this is reactive, will follow, consult hematology if persists  Septic shock secondary to peritonitis Treated with 8 days of meropenem and metronidazole on 6/21 Discontinued all abx and follow clinically   Relative Adrenal insufficiency (cortisol 12 at [place])  Stress steroids off  CBG monitoring and SSI CBG (last 3)   Recent Labs  09/18/16 1931 09/18/16 2336 09/19/16 0335  GLUCAP 150* 160* 299*   Acute metabolic encephalopathy Occasional jerking movements -resolved Seizure - ?benzo withdrawal  Post op pain  PRN fentanyl  Klonipin 0.25 BID PO If seizure recurs will need EEG, MRI, neuro   Diarrhea -  flexiseal placed, c diff studies negative  Consultants:  General surgery  Subjective: Pt still having diffuse diarrhea, had acute resp distress last night, CTA neg for PE  Objective: Vitals:   09/19/16 0400 09/19/16 0500 09/19/16 0600 09/19/16 0700  BP: 124/80 132/85 119/85 126/83  Pulse: (!) 126 (!) 114 (!) 118 (!) 123  Resp: (!) 40 (!) 34 (!) 36 (!) 39  Temp:      TempSrc:      SpO2: 95% 95% 96% 96%  Weight:      Height:        Intake/Output Summary (Last 24 hours) at 09/19/16 0732 Last data filed at 09/19/16 0700  Gross per 24 hour  Intake          2633.33 ml  Output             5665 ml  Net         -3031.67 ml   Filed Weights   09/18/16 0422 09/18/16 1310 09/19/16 0339  Weight: 55.2 kg (121 lb 11.1 oz) 55.4 kg (122 lb 2.2 oz) 51.4 kg (113 lb 5.1 oz)   REVIEW OF SYSTEMS  As per history otherwise all reviewed and reported negative  Exam:  General appearance: alert, cooperative and no distress, emaciated appearing Resp: diminished BS right base Cardio: tachycardic, S1, S2 normal, no murmur, click, rub or gallop GI: soft, active bowel sounds, minimal tenderness Incision - wound VAC in place  Data Reviewed: Basic Metabolic Panel:  Recent Labs Lab 09/15/16 0417  09/16/16 0350 09/16/16 1913 09/17/16 0454 09/18/16 0430 09/19/16 0219 09/19/16 0245  NA 141  < > 137 139 138 138  --  140  K 3.0*  < >  3.4* 3.3* 3.1* 3.8  --  4.0  CL 103  < > 103 102 103 110  --  116*  CO2 31  < > 30 30 28  21*  --  17*  GLUCOSE 178*  < > 194* 117* 164* 176*  --  145*  BUN 23*  < > 26* 22* 22* 26*  --  27*  CREATININE 0.37*  < > 0.41* 0.35* 0.42* 0.42*  --  0.45  CALCIUM 7.2*  < > 7.5* 7.6* 7.7* 7.9*  --  8.5*  MG 2.1  --  1.5*  --  1.7 1.7 1.7  --   PHOS 3.1  --  3.5  --  3.4 3.5  --  3.7  < > = values in this interval not displayed. Liver Function Tests:  Recent Labs Lab 09/13/16 0322  09/15/16 0417 09/16/16 0350 09/17/16 0454 09/18/16 0430 09/19/16 0245  AST 18   --   --  17  --   --   --   ALT 22  --   --  19  --   --   --   ALKPHOS 57  --   --  59  --   --   --   BILITOT 0.4  --   --  0.2*  --   --   --   PROT 3.7*  --   --  4.4*  --   --   --   ALBUMIN 1.3*  < > 1.3* 1.4* 1.5* 1.7* 2.0*  < > = values in this interval not displayed. No results for input(s): LIPASE, AMYLASE in the last 168 hours. No results for input(s): AMMONIA in the last 168 hours. CBC:  Recent Labs Lab 09/15/16 0417 09/16/16 0350 09/17/16 0454 09/18/16 0430 09/19/16 0219  WBC 21.9* 20.0* 21.9* 21.7* 25.1*  NEUTROABS 19.4* 17.1* 18.6* 18.4* 21.1*  HGB 9.4* 9.1* 9.3* 9.5* 10.0*  HCT 30.2* 29.4* 29.2* 30.3* 32.0*  MCV 79.7 79.7 80.7 81.2 80.2  PLT 322 451* 633* 762* 996*   Cardiac Enzymes:  Recent Labs Lab 09/19/16 0219  TROPONINI 0.04*   CBG (last 3)   Recent Labs  09/18/16 1931 09/18/16 2336 09/19/16 0335  GLUCAP 150* 160* 183*   Recent Results (from the past 240 hour(s))  Urine culture     Status: None   Collection Time: 09/10/16  3:22 PM  Result Value Ref Range Status   Specimen Description URINE, CATHETERIZED  Final   Special Requests NONE  Final   Culture NO GROWTH  Final   Report Status 09/11/2016 FINAL  Final  MRSA PCR Screening     Status: None   Collection Time: 09/10/16  3:29 PM  Result Value Ref Range Status   MRSA by PCR NEGATIVE NEGATIVE Final    Comment:        The GeneXpert MRSA Assay (FDA approved for NASAL specimens only), is one component of a comprehensive MRSA colonization surveillance program. It is not intended to diagnose MRSA infection nor to guide or monitor treatment for MRSA infections.   Culture, respiratory (tracheal aspirate)     Status: None   Collection Time: 09/10/16  4:03 PM  Result Value Ref Range Status   Specimen Description TRACHEAL ASPIRATE  Final   Special Requests NONE  Final   Gram Stain   Final    MODERATE WBC PRESENT,BOTH PMN AND MONONUCLEAR RARE BUDDING YEAST SEEN RARE GRAM NEGATIVE COCCI  IN PAIRS    Culture FEW CANDIDA ALBICANS  Final   Report Status 09/13/2016 FINAL  Final  Culture, blood (routine x 2)     Status: None   Collection Time: 09/10/16  4:05 PM  Result Value Ref Range Status   Specimen Description BLOOD RIGHT FOOT  Final   Special Requests   Final    BOTTLES DRAWN AEROBIC ONLY Blood Culture results may not be optimal due to an inadequate volume of blood received in culture bottles   Culture NO GROWTH 5 DAYS  Final   Report Status 09/15/2016 FINAL  Final  Culture, blood (routine x 2)     Status: None   Collection Time: 09/10/16  4:20 PM  Result Value Ref Range Status   Specimen Description BLOOD LEFT FOOT  Final   Special Requests IN PEDIATRIC BOTTLE Blood Culture adequate volume  Final   Culture NO GROWTH 5 DAYS  Final   Report Status 09/15/2016 FINAL  Final  C difficile quick scan w PCR reflex     Status: None   Collection Time: 09/17/16  5:25 PM  Result Value Ref Range Status   C Diff antigen NEGATIVE NEGATIVE Final   C Diff toxin NEGATIVE NEGATIVE Final   C Diff interpretation No C. difficile detected.  Final     Studies: Ct Angio Chest Pe W Or Wo Contrast  Result Date: 09/19/2016 CLINICAL DATA:  Acute respiratory distress.  Elevated D-dimer. EXAM: CT ANGIOGRAPHY CHEST WITH CONTRAST TECHNIQUE: Multidetector CT imaging of the chest was performed using the standard protocol during bolus administration of intravenous contrast. Multiplanar CT image reconstructions and MIPs were obtained to evaluate the vascular anatomy. CONTRAST:  60 cc Isovue 370 IV COMPARISON:  Multiple prior radiographs most recently earlier this day. FINDINGS: Cardiovascular: There are no filling defects within the pulmonary arteries to suggest pulmonary embolus. Lower lobe evaluation is obscured by cardiac motion artifact. The thoracic aorta is normal in caliber without dissection. Coronary artery calcifications are seen. Fluid adjacent to the left ventricle is likely pericardial  effusion Mediastinum/Nodes: No mediastinal or hilar adenopathy. No axillary adenopathy. Enteric tube in place, tip coursing be on the field of view. The right internal jugular central line tip is obscured by dense contrast. Possible pericardial effusion versus tracking left pleural effusion adjacent to the left heart, cardiac motion obscures detailed evaluation. Lungs/Pleura: Dependent left lower lobe opacity with air bronchograms, small left pleural effusion. Right middle lobe opacity with air bronchograms and volume loss. Minimal dependent right lower lobe atelectasis. Trace right pleural effusion. No evidence of pulmonary edema. Mild emphysema. Minimal debris in the dependent trachea may be mucus or aspiration. Upper Abdomen: Cholecystectomy clips.  Fluid in the stomach. Musculoskeletal: There are no acute or suspicious osseous abnormalities. Review of the MIP images confirms the above findings. IMPRESSION: 1. No pulmonary embolus. 2. Small bilateral pleural effusions, left greater than right. 3. Consolidations with air bronchograms in the dependent left lower lobe, right middle lobe with volume loss and dependent right lower lobe, favored to be atelectasis; pneumonia or aspiration not excluded. 4. Minimal debris in the dependent trachea, may be mucus or aspiration. 5. Coronary artery calcifications.  Emphysema. Emphysema (ICD10-J43.9). Electronically Signed   By: Jeb Levering M.D.   On: 09/19/2016 06:01   Dg Chest Port 1 View  Result Date: 09/19/2016 CLINICAL DATA:  Tachypnea and hypoxia. EXAM: PORTABLE CHEST 1 VIEW COMPARISON:  Chest radiograph September 16, 2016 FINDINGS: Cardiomediastinal silhouette is normal. Persistent mildly elevated RIGHT hemidiaphragm with bibasilar strandy densities. Trace LEFT pleural effusion. No pneumothorax.  RIGHT internal jugular central venous catheter distal tip projects in proximal superior vena cava. Nasogastric tube past proximal stomach, distal tip out of field-of-view.  Surgical clips in the included right abdomen compatible with cholecystectomy. Soft tissue planes and included osseous structures are unchanged. IMPRESSION: Bibasilar atelectasis.  Trace LEFT pleural effusion. No apparent change in life-support lines. Electronically Signed   By: Elon Alas M.D.   On: 09/19/2016 02:26   Scheduled Meds: . aspirin  162 mg Oral Daily  . Chlorhexidine Gluconate Cloth  6 each Topical Daily  . clonazePAM  0.25 mg Per Tube BID  . diphenoxylate-atropine  10 mL Oral QID  . heparin  5,000 Units Subcutaneous Q8H  . insulin aspart  0-15 Units Subcutaneous Q4H  . mouth rinse  15 mL Mouth Rinse BID  . [START ON 09/20/2016] pantoprazole  40 mg Oral Q0600  . sodium chloride flush  10-40 mL Intracatheter Q12H   Continuous Infusions: . sodium chloride    . feeding supplement (VITAL AF 1.2 CAL) 1,000 mL (09/18/16 1409)  . methocarbamol (ROBAXIN)  IV 500 mg (09/17/16 1722)   Principal Problem:   Septic shock ([place]) Active Problems:   Encounter for central line placement   ARDS (adult respiratory distress syndrome) (HCC)   Diarrhea   Severe protein-calorie malnutrition ([place])   Leukocytosis   Peritonitis ([place])   Critical Care Time spent: 40 mins  Irwin Brakeman, MD, FAAFP [facility] Pager [phone]  If 7PM-7AM, please contact night-coverage [email or web address] Password [email or web address] 09/19/2016, 7:32 AM    LOS: 9 days

## 2016-09-19 NOTE — Progress Notes (Signed)
RN went to check on patient, noticed patient was tachypnic to the 77s and only satting 90%. Patient also sounded "rattley" and patient's voice was not clear as it had been prior in the night. Respiratory at bedside. [facility] made aware, cxray and duoneb ordered.

## 2016-09-19 NOTE — Progress Notes (Signed)
Subjective/Chief Complaint: Still with diarrhea. c diff negative Pt was still getting NG tube feeds but was cleared for dys 3 diet. Last pm had resp distress. CT PE negative. CT suggestive asp pna,    Objective: Vital signs in last 24 hours: Temp:  [97.7 F (36.5 C)-98.9 F (37.2 C)] 98.8 F (37.1 C) (06/24 0337) Pulse Rate:  [112-128] 123 (06/24 0700) Resp:  [32-44] 39 (06/24 0700) BP: (109-148)/(70-109) 126/83 (06/24 0700) SpO2:  [89 %-97 %] 96 % (06/24 0700) Weight:  [51.4 kg (113 lb 5.1 oz)-55.4 kg (122 lb 2.2 oz)] 51.4 kg (113 lb 5.1 oz) (06/24 0339) Last BM Date: 09/19/16  Intake/Output from previous day: 06/23 0701 - 06/24 0700 In: 2633.3 [P.O.:670; I.V.:253.3; NG/GT:1710] Out: 9211 [Urine:2675; Drains:90; HERDE:0814] Intake/Output this shift: No intake/output data recorded.  Awake, alert, nontoxic Symmetric chest rise Tachy Soft, nd, not really tender; wound vac ok  Lab Results:   Recent Labs  09/18/16 0430 09/19/16 0219  WBC 21.7* 25.1*  HGB 9.5* 10.0*  HCT 30.3* 32.0*  PLT 762* 996*   BMET  Recent Labs  09/18/16 0430 09/19/16 0245  NA 138 140  K 3.8 4.0  CL 110 116*  CO2 21* 17*  GLUCOSE 176* 145*  BUN 26* 27*  CREATININE 0.42* 0.45  CALCIUM 7.9* 8.5*   PT/INR No results for input(s): LABPROT, INR in the last 72 hours. ABG No results for input(s): PHART, HCO3 in the last 72 hours.  Invalid input(s): PCO2, PO2  Studies/Results: Ct Angio Chest Pe W Or Wo Contrast  Result Date: 09/19/2016 CLINICAL DATA:  Acute respiratory distress.  Elevated D-dimer. EXAM: CT ANGIOGRAPHY CHEST WITH CONTRAST TECHNIQUE: Multidetector CT imaging of the chest was performed using the standard protocol during bolus administration of intravenous contrast. Multiplanar CT image reconstructions and MIPs were obtained to evaluate the vascular anatomy. CONTRAST:  60 cc Isovue 370 IV COMPARISON:  Multiple prior radiographs most recently earlier this day. FINDINGS:  Cardiovascular: There are no filling defects within the pulmonary arteries to suggest pulmonary embolus. Lower lobe evaluation is obscured by cardiac motion artifact. The thoracic aorta is normal in caliber without dissection. Coronary artery calcifications are seen. Fluid adjacent to the left ventricle is likely pericardial effusion Mediastinum/Nodes: No mediastinal or hilar adenopathy. No axillary adenopathy. Enteric tube in place, tip coursing be on the field of view. The right internal jugular central line tip is obscured by dense contrast. Possible pericardial effusion versus tracking left pleural effusion adjacent to the left heart, cardiac motion obscures detailed evaluation. Lungs/Pleura: Dependent left lower lobe opacity with air bronchograms, small left pleural effusion. Right middle lobe opacity with air bronchograms and volume loss. Minimal dependent right lower lobe atelectasis. Trace right pleural effusion. No evidence of pulmonary edema. Mild emphysema. Minimal debris in the dependent trachea may be mucus or aspiration. Upper Abdomen: Cholecystectomy clips.  Fluid in the stomach. Musculoskeletal: There are no acute or suspicious osseous abnormalities. Review of the MIP images confirms the above findings. IMPRESSION: 1. No pulmonary embolus. 2. Small bilateral pleural effusions, left greater than right. 3. Consolidations with air bronchograms in the dependent left lower lobe, right middle lobe with volume loss and dependent right lower lobe, favored to be atelectasis; pneumonia or aspiration not excluded. 4. Minimal debris in the dependent trachea, may be mucus or aspiration. 5. Coronary artery calcifications.  Emphysema. Emphysema (ICD10-J43.9). Electronically Signed   By: Jeb Levering M.D.   On: 09/19/2016 06:01   Dg Chest [facility] 1 [address]  Result Date: 09/19/2016 CLINICAL DATA:  Tachypnea and hypoxia. EXAM: PORTABLE CHEST 1 VIEW COMPARISON:  Chest radiograph September 16, 2016 FINDINGS:  Cardiomediastinal silhouette is normal. Persistent mildly elevated RIGHT hemidiaphragm with bibasilar strandy densities. Trace LEFT pleural effusion. No pneumothorax. RIGHT internal jugular central venous catheter distal tip projects in proximal superior vena cava. Nasogastric tube past proximal stomach, distal tip out of field-of-view. Surgical clips in the included right abdomen compatible with cholecystectomy. Soft tissue planes and included osseous structures are unchanged. IMPRESSION: Bibasilar atelectasis.  Trace LEFT pleural effusion. No apparent change in life-support lines. Electronically Signed   By: Elon Alas M.D.   On: 09/19/2016 02:26    Anti-infectives: Anti-infectives    Start     Dose/Rate Route Frequency Ordered Stop   09/19/16 0800  meropenem (MERREM) 1 g in sodium chloride 0.9 % 100 mL IVPB     1 g 200 mL/hr over 30 Minutes Intravenous Every 8 hours 09/19/16 0738     09/11/16 1300  metroNIDAZOLE (FLAGYL) IVPB 500 mg  Status:  Discontinued     500 mg 100 mL/hr over 60 Minutes Intravenous Every 8 hours 09/11/16 1208 09/16/16 0912   09/11/16 1300  meropenem (MERREM) 1 g in sodium chloride 0.9 % 100 mL IVPB  Status:  Discontinued     1 g 200 mL/hr over 30 Minutes Intravenous Every 8 hours 09/11/16 1259 09/16/16 0912      Assessment/Plan: Sepsis ARDS - extubated, management per CCM Severe protein calorie malnutrition  Seizure  SBO with perforation S/P Ex lap, 09/07/16, [facility] - Dr. Brantley Stage - tube feeds @ 38mL/hr - continue midline wound vac, changes MWF  Diarrhea c diff negative [facility] started lomotil today; will stop and give imodium instead x 2 doses. (imodium a little less 'harsh' than lomotil)  Leighton Ruff. Redmond Pulling, MD, FACS General, Bariatric, & Minimally Invasive Surgery [facility], [place]   LOS: 9 days    Gayland Curry 09/19/2016

## 2016-09-19 NOTE — Progress Notes (Signed)
[facility] notified of pt HR 130's, increases WOB with RR 44bpm. Dr. Wynetta Emery return paged and placed orders as indicated.

## 2016-09-19 NOTE — Progress Notes (Addendum)
CRITICAL VALUE ALERT  Critical Value:  ABG- ph 7.38, pco2 20, po2 94, hco3 12.1  Date & Time Notied: 09/19/16 @1715   Provider Notified: Dr. Irwin Brakeman  No orders received at this time.

## 2016-09-20 ENCOUNTER — Inpatient Hospital Stay (HOSPITAL_COMMUNITY): Payer: BLUE CROSS/BLUE SHIELD

## 2016-09-20 DIAGNOSIS — D473 Essential (hemorrhagic) thrombocythemia: Secondary | ICD-10-CM | POA: Diagnosis not present

## 2016-09-20 DIAGNOSIS — R509 Fever, unspecified: Secondary | ICD-10-CM

## 2016-09-20 DIAGNOSIS — R52 Pain, unspecified: Secondary | ICD-10-CM

## 2016-09-20 DIAGNOSIS — D75839 Thrombocytosis, unspecified: Secondary | ICD-10-CM | POA: Diagnosis not present

## 2016-09-20 DIAGNOSIS — E870 Hyperosmolality and hypernatremia: Secondary | ICD-10-CM | POA: Diagnosis not present

## 2016-09-20 LAB — BLOOD GAS, ARTERIAL
ACID-BASE DEFICIT: 12.6 mmol/L — AB (ref 0.0–2.0)
BICARBONATE: 11.7 mmol/L — AB (ref 20.0–28.0)
DRAWN BY: 362771
O2 Content: 6 L/min
O2 Saturation: 96.6 %
PATIENT TEMPERATURE: 98.6
pCO2 arterial: 20.3 mmHg — ABNORMAL LOW (ref 32.0–48.0)
pH, Arterial: 7.377 (ref 7.350–7.450)
pO2, Arterial: 95.9 mmHg (ref 83.0–108.0)

## 2016-09-20 LAB — GLUCOSE, CAPILLARY
GLUCOSE-CAPILLARY: 115 mg/dL — AB (ref 65–99)
Glucose-Capillary: 218 mg/dL — ABNORMAL HIGH (ref 65–99)
Glucose-Capillary: 180 mg/dL — ABNORMAL HIGH (ref 65–99)
Glucose-Capillary: 103 mg/dL — ABNORMAL HIGH (ref 65–99)

## 2016-09-20 LAB — COMPREHENSIVE METABOLIC PANEL
ALBUMIN: 2.1 g/dL — AB (ref 3.5–5.0)
ALK PHOS: 348 U/L — AB (ref 38–126)
ALT: 53 U/L (ref 14–54)
ANION GAP: 8 (ref 5–15)
AST: 26 U/L (ref 15–41)
BILIRUBIN TOTAL: 0.6 mg/dL (ref 0.3–1.2)
BUN: 36 mg/dL — AB (ref 6–20)
CALCIUM: 8.3 mg/dL — AB (ref 8.9–10.3)
CO2: 14 mmol/L — ABNORMAL LOW (ref 22–32)
Chloride: 129 mmol/L — ABNORMAL HIGH (ref 101–111)
Creatinine, Ser: 0.65 mg/dL (ref 0.44–1.00)
GFR calc Af Amer: >60 mL/min (ref >60)
GFR calc non Af Amer: >60 mL/min (ref >60)
GLUCOSE: 254 mg/dL — AB (ref 65–99)
Potassium: 4 mmol/L (ref 3.5–5.1)
Sodium: 151 mmol/L — ABNORMAL HIGH (ref 135–145)
TOTAL PROTEIN: 6.6 g/dL (ref 6.5–8.1)

## 2016-09-20 LAB — CBC WITH DIFFERENTIAL/PLATELET
BASOS ABS: 0 10*3/uL (ref 0.0–0.1)
BASOS PCT: 0 %
Eosinophils Absolute: 0 10*3/uL (ref 0.0–0.7)
Eosinophils Relative: 0 %
HEMATOCRIT: 32.2 % — AB (ref 36.0–46.0)
Hemoglobin: 9.9 g/dL — ABNORMAL LOW (ref 12.0–15.0)
LYMPHS PCT: 9 %
Lymphs Abs: 2 10*3/uL (ref 0.7–4.0)
MCH: 25.1 pg — ABNORMAL LOW (ref 26.0–34.0)
MCHC: 30.7 g/dL (ref 30.0–36.0)
MCV: 81.7 fL (ref 78.0–100.0)
Monocytes Absolute: 1.6 10*3/uL — ABNORMAL HIGH (ref 0.1–1.0)
Monocytes Relative: 7 %
NEUTROS ABS: 18.6 10*3/uL — AB (ref 1.7–7.7)
Neutrophils Relative %: 84 %
Platelets: 1021 10*3/uL (ref 150–400)
RBC: 3.94 MIL/uL (ref 3.87–5.11)
RDW: 17.7 % — ABNORMAL HIGH (ref 11.5–15.5)
WBC: 22.2 10*3/uL — ABNORMAL HIGH (ref 4.0–10.5)

## 2016-09-20 LAB — BASIC METABOLIC PANEL
ANION GAP: 7 (ref 5–15)
BUN: 36 mg/dL — AB (ref 6–20)
BUN: 23 mg/dL — ABNORMAL HIGH (ref 6–20)
CO2: 19 mmol/L — ABNORMAL LOW (ref 22–32)
CO2: 22 mmol/L (ref 22–32)
Calcium: 8.6 mg/dL — ABNORMAL LOW (ref 8.9–10.3)
Calcium: 8.2 mg/dL — ABNORMAL LOW (ref 8.9–10.3)
Chloride: >130 mmol/L (ref 101–111)
Chloride: 123 mmol/L — ABNORMAL HIGH (ref 101–111)
Creatinine, Ser: 0.59 mg/dL (ref 0.44–1.00)
Creatinine, Ser: 0.46 mg/dL (ref 0.44–1.00)
GFR calc Af Amer: >60 mL/min (ref >60)
GFR calc non Af Amer: >60 mL/min (ref >60)
GLUCOSE: 215 mg/dL — AB (ref 65–99)
GLUCOSE: 109 mg/dL — AB (ref 65–99)
POTASSIUM: 3 mmol/L — AB (ref 3.5–5.1)
Potassium: 4.2 mmol/L (ref 3.5–5.1)
Sodium: 157 mmol/L — ABNORMAL HIGH (ref 135–145)
Sodium: 152 mmol/L — ABNORMAL HIGH (ref 135–145)

## 2016-09-20 LAB — SEDIMENTATION RATE: Sed Rate: 88 mm/hr — ABNORMAL HIGH (ref 0–22)

## 2016-09-20 LAB — PATHOLOGIST SMEAR REVIEW

## 2016-09-20 LAB — PROCALCITONIN: Procalcitonin: 0.35 ng/mL

## 2016-09-20 LAB — LIPASE, BLOOD: LIPASE: 110 U/L — AB (ref 11–51)

## 2016-09-20 LAB — LACTIC ACID, PLASMA: LACTIC ACID, VENOUS: 1.2 mmol/L (ref 0.5–1.9)

## 2016-09-20 LAB — MAGNESIUM: MAGNESIUM: 2 mg/dL (ref 1.7–2.4)

## 2016-09-20 LAB — AMYLASE: Amylase: 121 U/L — ABNORMAL HIGH (ref 28–100)

## 2016-09-20 LAB — C-REACTIVE PROTEIN: CRP: 2.5 mg/dL — AB (ref <1.0)

## 2016-09-20 LAB — FERRITIN: FERRITIN: 119 ng/mL (ref 11–307)

## 2016-09-20 MED ORDER — IOPAMIDOL (ISOVUE-300) INJECTION 61%
INTRAVENOUS | Status: AC
  Administered 2016-09-20: 80 mL
  Filled 2016-09-20: qty 100

## 2016-09-20 MED ORDER — CLONAZEPAM 0.5 MG PO TBDP
0.5000 mg | ORAL_TABLET | Freq: Two times a day (BID) | ORAL | Status: DC
  Administered 2016-09-20 – 2016-09-21 (×3): 0.5 mg via ORAL
  Filled 2016-09-20 (×3): qty 1

## 2016-09-20 MED ORDER — PANTOPRAZOLE SODIUM 40 MG PO PACK
40.0000 mg | PACK | Freq: Every day | ORAL | Status: DC
  Administered 2016-09-20 – 2016-09-27 (×8): 40 mg
  Filled 2016-09-20 (×9): qty 20

## 2016-09-20 MED ORDER — DEXTROSE 5 % IV SOLN
INTRAVENOUS | Status: DC
Start: 2016-09-20 — End: 2016-09-20

## 2016-09-20 MED ORDER — SODIUM CHLORIDE 0.9 % IV SOLN
200.0000 mg | Freq: Once | INTRAVENOUS | Status: AC
  Administered 2016-09-20: 200 mg via INTRAVENOUS
  Filled 2016-09-20: qty 200

## 2016-09-20 MED ORDER — LEVALBUTEROL HCL 0.63 MG/3ML IN NEBU
0.6300 mg | INHALATION_SOLUTION | Freq: Four times a day (QID) | RESPIRATORY_TRACT | Status: DC
  Administered 2016-09-20 – 2016-09-23 (×13): 0.63 mg via RESPIRATORY_TRACT
  Filled 2016-09-20 (×25): qty 3

## 2016-09-20 MED ORDER — SODIUM CHLORIDE 0.9 % IV SOLN
100.0000 mg | INTRAVENOUS | Status: DC
  Administered 2016-09-21 – 2016-09-30 (×10): 100 mg via INTRAVENOUS
  Filled 2016-09-20 (×11): qty 100

## 2016-09-20 MED ORDER — SODIUM CHLORIDE 0.45 % IV SOLN
INTRAVENOUS | Status: DC
  Administered 2016-09-20: 125 mL/h via INTRAVENOUS

## 2016-09-20 MED ORDER — VANCOMYCIN HCL IN DEXTROSE 750-5 MG/150ML-% IV SOLN
750.0000 mg | Freq: Two times a day (BID) | INTRAVENOUS | Status: DC
  Administered 2016-09-20 – 2016-09-21 (×4): 750 mg via INTRAVENOUS
  Filled 2016-09-20 (×5): qty 150

## 2016-09-20 MED ORDER — SODIUM CHLORIDE 0.9 % IV BOLUS (SEPSIS)
500.0000 mL | Freq: Once | INTRAVENOUS | Status: AC
  Administered 2016-09-20: 500 mL via INTRAVENOUS

## 2016-09-20 MED ORDER — IPRATROPIUM BROMIDE 0.02 % IN SOLN
0.5000 mg | Freq: Four times a day (QID) | RESPIRATORY_TRACT | Status: DC
  Administered 2016-09-20 – 2016-09-23 (×13): 0.5 mg via RESPIRATORY_TRACT
  Filled 2016-09-20 (×13): qty 2.5

## 2016-09-20 MED ORDER — SODIUM CHLORIDE 0.45 % IV SOLN
INTRAVENOUS | Status: DC
  Administered 2016-09-20: 08:00:00 via INTRAVENOUS

## 2016-09-20 MED ORDER — IOPAMIDOL (ISOVUE-300) INJECTION 61%
INTRAVENOUS | Status: AC
  Administered 2016-09-20: 30 mL
  Filled 2016-09-20: qty 30

## 2016-09-20 MED ORDER — SODIUM BICARBONATE 8.4 % IV SOLN
INTRAVENOUS | Status: DC
  Administered 2016-09-20 – 2016-09-21 (×3): via INTRAVENOUS
  Filled 2016-09-20 (×5): qty 150

## 2016-09-20 NOTE — Progress Notes (Signed)
09/20/2016 5:20 PM  I met with patient and family at bedside and updated them on CT findings and answered questions for them.  They verbalized understanding.   Murvin Natal MD

## 2016-09-20 NOTE — Progress Notes (Signed)
Patient still tachypnic and now has fever of 101.2. Patient also tachycardic to 140. Baltazar Najjar, NP made aware. Tylenol and 500 ml bolus given per orders.

## 2016-09-20 NOTE — Progress Notes (Signed)
[facility]/Trauma Progress Note      Subjective:  CC: abdominal pain  Pt is not speaking but nodding head yes or no to questions. She is having abdominal pain and difficulty breathing. Stool output 3700 in last 24 hrs.   Objective: Vital signs in last 24 hours: Temp:  [98.3 F (36.8 C)-101.3 F (38.5 C)] 99.4 F (37.4 C) (06/25 0353) Pulse Rate:  [112-143] 137 (06/25 0619) Resp:  [33-42] 36 (06/25 0619) BP: (120-139)/(78-113) 134/86 (06/25 0700) SpO2:  [81 %-100 %] 95 % (06/25 0619) Weight:  [107 lb 9.4 oz (48.8 kg)] 107 lb 9.4 oz (48.8 kg) (06/25 0500) Last BM Date: 09/19/16  Intake/Output from previous day: 06/24 0701 - 06/25 0700 In: 5065.9 [P.O.:712; I.V.:1493.9; NG/GT:1910; IV Piggyback:950] Out: 7858 [Urine:1625; IFOYD:7412] Intake/Output this shift: No intake/output data recorded.  PE: Gen: Alert, cooperative, lethargic HEENT: pupils equal, conjunctiva normal, no scleral icterus Card: tachycardic, regular rhythm, no M/G/R heard Pulm: increased rate, increased effort, CTA Abd: Soft, distended, hyperactive BS, midline incisions with vac in place, moderate generalized TTP without guarding or signs of peritonitis Skin: warm and dry  Lab Results:   Recent Labs  09/19/16 0219 09/20/16 0329  WBC 25.1* 22.2*  HGB 10.0* 9.9*  HCT 32.0* 32.2*  PLT 996* 1,021*   BMET  Recent Labs  09/19/16 0245 09/20/16 0329  NA 140 151*  K 4.0 4.0  CL 116* 129*  CO2 17* 14*  GLUCOSE 145* 254*  BUN 27* 36*  CREATININE 0.45 0.65  CALCIUM 8.5* 8.3*   PT/INR No results for input(s): LABPROT, INR in the last 72 hours. CMP     Component Value Date/Time   NA 151 (H) 09/20/2016 0329   NA 138 03/11/2012 0326   K 4.0 09/20/2016 0329   K 3.6 03/11/2012 0326   CL 129 (H) 09/20/2016 0329   CL 106 03/11/2012 0326   CO2 14 (L) 09/20/2016 0329   CO2 25 03/11/2012 0326   GLUCOSE 254 (H) 09/20/2016 0329   GLUCOSE 97 03/11/2012 0326   BUN 36 (H) 09/20/2016 0329    BUN 4 (L) 03/11/2012 0326   CREATININE 0.65 09/20/2016 0329   CREATININE 1.83 (H) 03/15/2012 0449   CALCIUM 8.3 (L) 09/20/2016 0329   CALCIUM 8.1 (L) 03/11/2012 0326   PROT 6.6 09/20/2016 0329   PROT 8.1 03/05/2012 1834   ALBUMIN 2.1 (L) 09/20/2016 0329   ALBUMIN 4.7 03/05/2012 1834   AST 26 09/20/2016 0329   AST 19 03/05/2012 1834   ALT 53 09/20/2016 0329   ALT 20 03/05/2012 1834   ALKPHOS 348 (H) 09/20/2016 0329   ALKPHOS 146 (H) 03/05/2012 1834   BILITOT 0.6 09/20/2016 0329   BILITOT 0.6 03/05/2012 1834   GFRNONAA >60 09/20/2016 0329   GFRNONAA 33 (L) 03/15/2012 0449   GFRAA >60 09/20/2016 0329   GFRAA 38 (L) 03/15/2012 0449   Lipase     Component Value Date/Time   LIPASE 110 (H) 09/20/2016 0125   LIPASE 69 (L) 03/05/2012 1834    Studies/Results: Ct Angio Chest Pe W Or Wo Contrast  Result Date: 09/19/2016 CLINICAL DATA:  Acute respiratory distress.  Elevated D-dimer. EXAM: CT ANGIOGRAPHY CHEST WITH CONTRAST TECHNIQUE: Multidetector CT imaging of the chest was performed using the standard protocol during bolus administration of intravenous contrast. Multiplanar CT image reconstructions and MIPs were obtained to evaluate the vascular anatomy. CONTRAST:  60 cc Isovue 370 IV COMPARISON:  Multiple prior radiographs most recently earlier this day. FINDINGS: Cardiovascular: There  are no filling defects within the pulmonary arteries to suggest pulmonary embolus. Lower lobe evaluation is obscured by cardiac motion artifact. The thoracic aorta is normal in caliber without dissection. Coronary artery calcifications are seen. Fluid adjacent to the left ventricle is likely pericardial effusion Mediastinum/Nodes: No mediastinal or hilar adenopathy. No axillary adenopathy. Enteric tube in place, tip coursing be on the field of view. The right internal jugular central line tip is obscured by dense contrast. Possible pericardial effusion versus tracking left pleural effusion adjacent to the left  heart, cardiac motion obscures detailed evaluation. Lungs/Pleura: Dependent left lower lobe opacity with air bronchograms, small left pleural effusion. Right middle lobe opacity with air bronchograms and volume loss. Minimal dependent right lower lobe atelectasis. Trace right pleural effusion. No evidence of pulmonary edema. Mild emphysema. Minimal debris in the dependent trachea may be mucus or aspiration. Upper Abdomen: Cholecystectomy clips.  Fluid in the stomach. Musculoskeletal: There are no acute or suspicious osseous abnormalities. Review of the MIP images confirms the above findings. IMPRESSION: 1. No pulmonary embolus. 2. Small bilateral pleural effusions, left greater than right. 3. Consolidations with air bronchograms in the dependent left lower lobe, right middle lobe with volume loss and dependent right lower lobe, favored to be atelectasis; pneumonia or aspiration not excluded. 4. Minimal debris in the dependent trachea, may be mucus or aspiration. 5. Coronary artery calcifications.  Emphysema. Emphysema (ICD10-J43.9). Electronically Signed   By: Jeb Levering M.D.   On: 09/19/2016 06:01   Dg Chest Port 1 View  Result Date: 09/20/2016 CLINICAL DATA:  Aspiration.  Follow-up. EXAM: PORTABLE CHEST 1 VIEW COMPARISON:  09/19/2016. FINDINGS: Nasogastric tube and right internal jugular central line are unchanged. There is slightly better aeration in both lower lobes. Upper lungs remain clear. IMPRESSION: Mild radiographic improvement in the lower lobe aeration. Electronically Signed   By: Nelson Chimes M.D.   On: 09/20/2016 07:23   Dg Chest Port 1 View  Result Date: 09/19/2016 CLINICAL DATA:  Tachypnea. EXAM: PORTABLE CHEST 1 VIEW COMPARISON:  Chest CT 09/19/2016 at 5:34 a.m. FINDINGS: Cardiac silhouette is normal in size. No mediastinal or hilar masses. Right internal jugular central venous line tip projects in the upper superior vena cava. Nasal/orogastric tube passes well below the diaphragm into  the stomach and below the included field of view. There is persistent lung base opacity consistent with atelectasis as noted on the current chest CT. No evidence of pulmonary edema. No pneumothorax. Skeletal structures are unremarkable. IMPRESSION: 1. Right internal jugular central venous line tip projects in the upper superior vena cava. No pneumothorax. 2. Well-positioned nasogastric tube. 3. Persistent lung base opacity consistent with atelectasis, stable from the current CT. No new lung abnormalities. Electronically Signed   By: Lajean Manes M.D.   On: 09/19/2016 17:32   Dg Chest Port 1 View  Result Date: 09/19/2016 CLINICAL DATA:  Tachypnea and hypoxia. EXAM: PORTABLE CHEST 1 VIEW COMPARISON:  Chest radiograph September 16, 2016 FINDINGS: Cardiomediastinal silhouette is normal. Persistent mildly elevated RIGHT hemidiaphragm with bibasilar strandy densities. Trace LEFT pleural effusion. No pneumothorax. RIGHT internal jugular central venous catheter distal tip projects in proximal superior vena cava. Nasogastric tube past proximal stomach, distal tip out of field-of-view. Surgical clips in the included right abdomen compatible with cholecystectomy. Soft tissue planes and included osseous structures are unchanged. IMPRESSION: Bibasilar atelectasis.  Trace LEFT pleural effusion. No apparent change in life-support lines. Electronically Signed   By: Elon Alas M.D.   On: 09/19/2016 02:26  Anti-infectives: Anti-infectives    Start     Dose/Rate Route Frequency Ordered Stop   09/20/16 0145  vancomycin (VANCOCIN) IVPB 750 mg/150 ml premix     750 mg 150 mL/hr over 60 Minutes Intravenous Every 12 hours 09/20/16 0135     09/19/16 0800  meropenem (MERREM) 1 g in sodium chloride 0.9 % 100 mL IVPB     1 g 200 mL/hr over 30 Minutes Intravenous Every 8 hours 09/19/16 0738     09/11/16 1300  metroNIDAZOLE (FLAGYL) IVPB 500 mg  Status:  Discontinued     500 mg 100 mL/hr over 60 Minutes Intravenous Every 8  hours 09/11/16 1208 09/16/16 0912   09/11/16 1300  meropenem (MERREM) 1 g in sodium chloride 0.9 % 100 mL IVPB  Status:  Discontinued     1 g 200 mL/hr over 30 Minutes Intravenous Every 8 hours 09/11/16 1259 09/16/16 0912       Assessment/Plan Sepsis ARDS - extubated, management per CCM Severe protein calorie malnutrition  Seizure  SBO with perforation S/P Ex lap, 09/07/16, [facility] - Dr. Brantley Stage - tube feeds @ 39mL/hr - continue midline wound vac, changes MWF  Diarrhea - c diff negative - imodium   Tachycardia and increased respiratory effort - CT angio neg for PE - elevated amylase and lipase - febrile - abdominal bloating and pain - hypernatremic   Plan: abd xray pending to evaluate for colonic diliation, pt is distended and tender. With high stool output and hypernatremia I'm concerned she is dry. Medicine has increased fluid rate.    LOS: 10 days    Kalman Drape , [facility] 09/20/2016, 7:57 AM Pager: [phone] Consults: [phone] Mon-Fri 7:00 am-4:30 pm Sat-Sun 7:00 am-11:30 am

## 2016-09-20 NOTE — Progress Notes (Addendum)
Pharmacy Antibiotic Note  Kenecia L Boettner is a 51 y.o. female admitted on 09/10/2016 with SBO, ARDS, septic shock.  Pharmacy has been consulted for Vancomycin dosing. WBC count remains elevated, Scr appears stable.   Vancomycin 6/25>> Meropenem 6/15 >> 6/21 6/24 >> Levaquin 6/11 >> 6/14 Imipenem 6/12 >> 6/15 Flagyl 6/11 >> 6/21  Plan: -Vancomycin 750 mg IV q12h -Already on Merrem  -Consider broadening with anti-fungal coverage if continues with fever/increasing WBC -Drug levels as indicated  Height: 5\' 5"  (165.1 cm) Weight: 113 lb 5.1 oz (51.4 kg) IBW/kg (Calculated) : 57  Temp (24hrs), Avg:99.7 F (37.6 C), Min:98.3 F (36.8 C), Max:101.3 F (38.5 C)   Recent Labs Lab 09/15/16 0417  09/16/16 0350 09/16/16 1913 09/17/16 0454 09/18/16 0430 09/19/16 0219 09/19/16 0245 09/19/16 1608  WBC 21.9*  --  20.0*  --  21.9* 21.7* 25.1*  --   --   CREATININE 0.37*  < > 0.41* 0.35* 0.42* 0.42*  --  0.45  --   LATICACIDVEN  --   --   --   --   --   --   --   --  1.2  < > = values in this interval not displayed.  Estimated Creatinine Clearance: 69.8 mL/min (by C-G formula based on SCr of 0.45 mg/dL).    Allergies  Allergen Reactions  . Penicillins Rash    Has patient had a PCN reaction causing immediate rash, facial/tongue/throat swelling, SOB or lightheadedness with hypotension: Unknown Has patient had a PCN reaction causing severe rash involving mucus membranes or skin necrosis: Unknown Has patient had a PCN reaction that required hospitalization: No Has patient had a PCN reaction occurring within the last 10 years: No If all of the above answers are "NO", then may proceed with Cephalosporin use.      Narda Bonds 09/20/2016 1:46 AM

## 2016-09-20 NOTE — Progress Notes (Signed)
PULMONARY / CRITICAL CARE MEDICINE   Name: Brandi Leonard MRN: [id number] DOB: [birth year]    ADMISSION DATE:  09/10/2016 CONSULTATION DATE:  09/10/2016  REFERRING MD:  Dr. Ger[facility]  CHIEF COMPLAINT:  shock  HISTORY OF PRESENT ILLNESS:   51 year old admitted to [facility] 6/11 with Small bowel obstruction, underwent emergent laparotomy on 6/12 for perforation with peritonitis. Failed extubation postoperatively and then developed diffuse bilateral infiltrates consistent with ARDS and septic shock requiring Levophed.  Extubated 6/21.  On 6/25 developed worsening respiratory failure, ?aspiration v HCAP, large volume stool output.  PCCM reconsulted.   STUDIES:  CT abdomen 6/11 > multiple dilated loops of fluid filled small bowel consistent with mechanical small bowel obstruction Echo 6/16 >> nml LV fn, mild apical pericardial effusion CTA chest 6/24>>> Neg PE, L>R bilat effusions, LLL consolidation with air bronchograms, RML volume loss  BLE venous doppler 6/25>>>  CULTURES: Blood 6/12 > Coag neg staph 1/2 bottles BC x 2 6/15>>> NEG Sputum 6/15>> few candida CDiff 6/22>>> neg  BC x 2 6/24>>>    ANTIBIOTICS: Primaxin 6/12 > 6/21 Levaquin 6/12 >> 6/12 Flagyl 6/12 > 6/21 Meropenem 6/24>>>  Vanc 6/25>>>  anidulo 6/25>>>  SIGNIFICANT EVENTS: 6/11 admit for SBO 6/12 perf to OR for repair 6/13 contnue shock, resp failure 6/15 transfer to [facility] for ICU admit. 6/16 started diuresis 6/25- worsening resp status  LINES/TUBES: ETT 6/13 >6/21 R fem CVL 6/12 >6/15 Art line rt radial 6/15 > 6/21 IJ CVL 6/15>>>  SUBJECTIVE:  Tachpneic, tachycardic. Large volume stool output.  CDiff neg.   VITAL SIGNS: BP 127/85 (BP Location: Left Arm)   Pulse (!) 140   Temp 99.4 F (37.4 C) (Oral)   Resp (!) 38   Ht 5\' 5"  (1.651 m)   Wt 48.8 kg (107 lb 9.4 oz)   LMP 09/16/2016   SpO2 96%   BMI 17.90 kg/m   HEMODYNAMICS:    VENTILATOR SETTINGS:    INTAKE / OUTPUT: I/O  last 3 completed shifts: In: 6005.9 [P.O.:712; I.V.:1623.9; NG/GT:2720; IV Piggyback:950] Out: 5003 [Urine:3700; Drains:50; Stool:4500]  PHYSICAL EXAMINATION: General:  Acutely ill appearing young female, NAD  HEENT: MM pink/moist, NG tube  Neuro:  Awake, alert, nods appropriately, MAE, generalized weakness  CV: s1s2 rrr, tachy, no m/r/g PULM: resps even, tachypnea but no significant increased WOB, no paradoxical resp pattern, diminished bases L>R, few scattered rhonchi  GI: distended, tender, hypoactive bs   Extremities: warm/dry, no edema  Skin: no rashes or lesions  LABS:  BMET  Recent Labs Lab 09/18/16 0430 09/19/16 0245 09/20/16 0329  NA 138 140 151*  K 3.8 4.0 4.0  CL 110 116* 129*  CO2 21* 17* 14*  BUN 26* 27* 36*  CREATININE 0.42* 0.45 0.65  GLUCOSE 176* 145* 254*   Electrolytes  Recent Labs Lab 09/17/16 0454 09/18/16 0430 09/19/16 0219 09/19/16 0245 09/20/16 0329  CALCIUM 7.7* 7.9*  --  8.5* 8.3*  MG 1.7 1.7 1.7  --  2.0  PHOS 3.4 3.5  --  3.7  --     CBC  Recent Labs Lab 09/18/16 0430 09/19/16 0219 09/20/16 0329  WBC 21.7* 25.1* 22.2*  HGB 9.5* 10.0* 9.9*  HCT 30.3* 32.0* 32.2*  PLT 762* 996* 1,021*   Coag's No results for input(s): APTT, INR in the last 168 hours. Sepsis Markers  Recent Labs Lab 09/17/16 0454 09/18/16 0430 09/19/16 1608 09/20/16 0125  LATICACIDVEN  --   --  1.2  --   PROCALCITON  0.40 0.26  --  0.35   ABG  Recent Labs Lab 09/19/16 1704 09/20/16 0640  PHART 7.384 7.377  PCO2ART 20.3* 20.3*  PO2ART 94.0 95.9   Liver Enzymes  Recent Labs Lab 09/16/16 0350  09/18/16 0430 09/19/16 0245 09/20/16 0329  AST 17  --   --   --  26  ALT 19  --   --   --  53  ALKPHOS 59  --   --   --  348*  BILITOT 0.2*  --   --   --  0.6  ALBUMIN 1.4*  < > 1.7* 2.0* 2.1*  < > = values in this interval not displayed.  Cardiac Enzymes  Recent Labs Lab 09/19/16 0219 09/19/16 1100 09/19/16 1608  TROPONINI 0.04* 0.05*  0.05*   Glucose  Recent Labs Lab 09/19/16 0335 09/19/16 0840 09/19/16 1215 09/19/16 1637 09/19/16 1928 09/20/16 0809  GLUCAP 183* 162* 152* 176* 172* 218*    Imaging Dg Chest Port 1 View  Result Date: 09/20/2016 CLINICAL DATA:  Aspiration.  Follow-up. EXAM: PORTABLE CHEST 1 VIEW COMPARISON:  09/19/2016. FINDINGS: Nasogastric tube and right internal jugular central line are unchanged. There is slightly better aeration in both lower lobes. Upper lungs remain clear. IMPRESSION: Mild radiographic improvement in the lower lobe aeration. Electronically Signed   By: Nelson Chimes M.D.   On: 09/20/2016 07:23   Dg Chest Port 1 View  Result Date: 09/19/2016 CLINICAL DATA:  Tachypnea. EXAM: PORTABLE CHEST 1 VIEW COMPARISON:  Chest CT 09/19/2016 at 5:34 a.m. FINDINGS: Cardiac silhouette is normal in size. No mediastinal or hilar masses. Right internal jugular central venous line tip projects in the upper superior vena cava. Nasal/orogastric tube passes well below the diaphragm into the stomach and below the included field of view. There is persistent lung base opacity consistent with atelectasis as noted on the current chest CT. No evidence of pulmonary edema. No pneumothorax. Skeletal structures are unremarkable. IMPRESSION: 1. Right internal jugular central venous line tip projects in the upper superior vena cava. No pneumothorax. 2. Well-positioned nasogastric tube. 3. Persistent lung base opacity consistent with atelectasis, stable from the current CT. No new lung abnormalities. Electronically Signed   By: Lajean Manes M.D.   On: 09/19/2016 17:32    DISCUSSION:  52yo female post op emergent exp lap for SBO with perforation and peritonitis, respiratory failure/ARDS and septic shock.  Extubated 6/21, now with worsening respiratory failure r/t aspiration v HCAP.    ASSESSMENT / PLAN:  PULMONARY A: Acute hypoxemic respiratory failure - CTA neg for PE.  ARDS - improved  PNA -- HCAP +/-  aspiration  COPD without acute exacerbation P:   Tenuous respiratory status  bipap PRN for now  F/u ABG  Aggressive pulmonary hygiene  q6h duoneb  Titrate O2 for sat of 88-92  abx as below  Hold TF for now  BLE venous dopplers pending   CARDIOVASCULAR A:  Shock: likely septic due to peritonitis - resolved.  Demand ischemia -trop peak at 1.0 H/o HLD Tachycardia - volume status concerns SIRS  Relative Adrenal insufficiency (cortisol 12 at [place])  P:  Telemetry monitoring Hold further diureses PRN metoprolol  Gentle volume as below  Trend lactate, PCT  Off stress steroids   RENAL A:   Hypokalemia  Hypomag Hypernatremia  Hyperchloremia  NONAG  P:   Gentle volume - 1/2 NS  F/u chem, mg, phos  Strict I&O  GASTROINTESTINAL A:   SBO complicated by perforation now s/p  repair 6/12 Severe protein cal malnutrition abd distension/pain  Large volume stool output - CDiff neg  P:   NPO for now - hold TF  abd xray pending  Wound care per surgery - BID PPI   HEMATOLOGIC A:   Anemia Thrombocytosis  P:  F/u CBC  Subcutaneous heparin  INFECTIOUS A:   Septic shock secondary to peritonitis - resolved  SIRS  HCAP  P:   Completed 8 days of meropenem and metronidazole on 6/21 Meropenem, vanc resumed 6/25  Follow pct, lactate  Follow cultures data  Consider further imaging of abd   ENDOCRINE A:   Relative Adrenal insufficiency (cortisol 12 at [place])  P:   Stress steroids off  CBG monitoring and SSI  NEUROLOGIC A:   Acute metabolic encephalopathy Occasional jerking movements -resolved Seizure - ?benzo withdrawal  Post op pain  P:   Change clonazepam to ODT - ?getting absorbed parenteral PRN fentanyl  If seizure recurs will need EEG, MRI, neuro   FAMILY  - Updates: no family at bedside 6/25    Nickolas Madrid, NP 09/20/2016  9:00 AM Pager: [phone] or [phone]  STAFF NOTE: Linwood Dibbles, MD FACP have personally reviewed  patient's available data, including medical history, events of note, physical examination and test results as part of my evaluation. I have discussed with resident/NP and other care providers such as pharmacist, RN and RRT. In addition, I personally evaluated patient and elicited key findings of: awakens, fc simple, weak, CTA, abdo with vac, tender, high output, RR 40 is likley related to NONAG acidosis (d[place]) hypovolemia, impending sepsis?, she is HIGH risk for abdo abscess, I do nto believe she has PNA, I am concerned about abdo abscess, was on tpn and now these concerns, add antifungals, ensure BC done, get CT abdo /pelvis now, I reviewed CT chest and pcxr and do not see sig infiltrates, add bicarb for NONAG, hold off on slowing output until CT done, if neg would add immodium and max lamot, may need tincture opium, increase fluids by adding bicarb tp 1o 1/2 NS, total at 250, low threshold to bolus, maintain mero, vanc, get pct, I updated family in full and spoke to CCS  The patient is critically ill with multiple organ systems failure and requires high complexity decision making for assessment and support, frequent evaluation and titration of therapies, application of advanced monitoring technologies and extensive interpretation of multiple databases.   Critical Care Time devoted to patient care services described in this note is 30 Minutes. This time reflects time of care of this signee: Merrie Roof, MD FACP. This critical care time does not reflect procedure time, or teaching time or supervisory time of PA/NP/Med student/Med Resident etc but could involve care discussion time. Rest per NP/medical resident whose note is outlined above and that I agree with   Lavon Paganini. Titus Mould, MD, [place] Pgr: [facility] 09/20/2016 10:50 AM

## 2016-09-20 NOTE — Progress Notes (Signed)
PROGRESS NOTE  Brandi Leonard  LKG:[id number]  DOB: [birth year]  DOA: 09/10/2016 PCP: No primary care provider on file.  Brief Admission Hx: 51 year old admitted to [facility] 6/11 with Small bowel obstruction, underwent emergent laparotomy on 6/12 for perforation with peritonitis. Failed extubation postoperatively and then developed diffuse bilateral infiltrates consistent with ARDS and septic shock requiring Levophed. TRH assumed care 6/23.    SIGNIFICANT EVENTS: 6/11 admit for SBO 6/12 perf to OR for repair 6/13 contnue shock, resp failure 6/15 transfer to [facility] for ICU admit. 6/16 started diuresis 6/23 TR[facility] assumed care, dysphagia diet started 6/24  Acute resp distress/aspiration pneumonia  MDM/Assessment & Plan:   Acute hypoxemic respiratory failure ARDS Pulmonary edema: volume overload , left > R pleural effusion. Improving  COPD without acute exacerbation Pt had episode of respiratory distress 6/23, CTA suggesting aspiration pneumonia, WBC trending higher, restarted meropenem/vancomycin Lung exam consistent with LLL pneumonia. I asked intensivist team to reassess patient today    SBO complicated by perforation now s/p repair 6/12 Severe protein cal malnutrition Pt started on dys 3 diet 6/23 with nectar thick liquids, poor oral intake, also now with profuse diarrhea Wound care per surgery - midline wound vac, changes MWF PPI changed to p.o.  DC tube feeding now  Anemia F/u CBC  Subcutaneous heparin  Thrombocytosis - ? If this is reactive, has been on aspirin, check ferritin, sed rate, CRP, aspirin  Septic shock secondary to peritonitis Treated with 8 days of meropenem and metronidazole on 6/21  Relative Adrenal insufficiency (cortisol 12 at [place])  Stress steroids off  CBG monitoring and SSI CBG (last 3)   Recent Labs  09/19/16 1215 09/19/16 1637 09/19/16 1928  GLUCAP 152* 176* 664*   Acute metabolic encephalopathy Occasional jerking movements  -resolved Seizure - ?benzo withdrawal  Post op pain  PRN fentanyl  Klonipin 0.25 BID PO If seizure recurs will need EEG, MRI, neuro  Diarrhea - flexiseal placed, c diff studies negative, profuse diarrhea persists  Hypernatremia - free water deficit, not drinking well, 1/2 NS infusion ordered  Consultants:  General surgery  Subjective: Pt still having diffuse diarrhea, had acute resp distress last night, CTA neg for PE  Objective: Vitals:   09/20/16 0400 09/20/16 0500 09/20/16 0600 09/20/16 0619  BP: 138/86 139/88  129/84  Pulse: (!) 139 (!) 143 (!) 140 (!) 137  Resp: (!) 39 (!) 40 (!) 37 (!) 36  Temp:      TempSrc:      SpO2: 95% 95% 97% 95%  Weight:  48.8 kg (107 lb 9.4 oz)    Height:        Intake/Output Summary (Last 24 hours) at 09/20/16 0729 Last data filed at 09/20/16 0600  Gross per 24 hour  Intake          4805.92 ml  Output             5125 ml  Net          -319.08 ml   Filed Weights   09/18/16 1310 09/19/16 0339 09/20/16 0500  Weight: 55.4 kg (122 lb 2.2 oz) 51.4 kg (113 lb 5.1 oz) 48.8 kg (107 lb 9.4 oz)   REVIEW OF SYSTEMS  As per history otherwise all reviewed and reported negative  Exam:  General appearance: alert, cooperative and no distress, emaciated appearing Resp: diminished BS right base, crackles LLL Cardio: tachycardic, S1, S2 normal, no murmur, click, rub or gallop GI: soft, active bowel sounds, minimal tenderness Incision - wound  VAC in place  Data Reviewed: Basic Metabolic Panel:  Recent Labs Lab 09/15/16 0417  09/16/16 0350 09/16/16 1913 09/17/16 0454 09/18/16 0430 09/19/16 0219 09/19/16 0245 09/20/16 0329  NA 141  < > 137 139 138 138  --  140 151*  K 3.0*  < > 3.4* 3.3* 3.1* 3.8  --  4.0 4.0  CL 103  < > 103 102 103 110  --  116* 129*  CO2 31  < > 30 30 28  21*  --  17* 14*  GLUCOSE 178*  < > 194* 117* 164* 176*  --  145* 254*  BUN 23*  < > 26* 22* 22* 26*  --  27* 36*  CREATININE 0.37*  < > 0.41* 0.35* 0.42* 0.42*  --   0.45 0.65  CALCIUM 7.2*  < > 7.5* 7.6* 7.7* 7.9*  --  8.5* 8.3*  MG 2.1  --  1.5*  --  1.7 1.7 1.7  --  2.0  PHOS 3.1  --  3.5  --  3.4 3.5  --  3.7  --   < > = values in this interval not displayed. Liver Function Tests:  Recent Labs Lab 09/16/16 0350 09/17/16 0454 09/18/16 0430 09/19/16 0245 09/20/16 0329  AST 17  --   --   --  26  ALT 19  --   --   --  53  ALKPHOS 59  --   --   --  348*  BILITOT 0.2*  --   --   --  0.6  PROT 4.4*  --   --   --  6.6  ALBUMIN 1.4* 1.5* 1.7* 2.0* 2.1*    Recent Labs Lab 09/20/16 0125  LIPASE 110*  AMYLASE 121*   No results for input(s): AMMONIA in the last 168 hours. CBC:  Recent Labs Lab 09/16/16 0350 09/17/16 0454 09/18/16 0430 09/19/16 0219 09/20/16 0329  WBC 20.0* 21.9* 21.7* 25.1* 22.2*  NEUTROABS 17.1* 18.6* 18.4* 21.1* 18.6*  HGB 9.1* 9.3* 9.5* 10.0* 9.9*  HCT 29.4* 29.2* 30.3* 32.0* 32.2*  MCV 79.7 80.7 81.2 80.2 81.7  PLT 451* 633* 762* 996* 1,021*   Cardiac Enzymes:  Recent Labs Lab 09/19/16 0219 09/19/16 1100 09/19/16 1608  TROPONINI 0.04* 0.05* 0.05*   CBG (last 3)   Recent Labs  09/19/16 1215 09/19/16 1637 09/19/16 1928  GLUCAP 152* 176* 172*   Recent Results (from the past 240 hour(s))  Urine culture     Status: None   Collection Time: 09/10/16  3:22 PM  Result Value Ref Range Status   Specimen Description URINE, CATHETERIZED  Final   Special Requests NONE  Final   Culture NO GROWTH  Final   Report Status 09/11/2016 FINAL  Final  MRSA PCR Screening     Status: None   Collection Time: 09/10/16  3:29 PM  Result Value Ref Range Status   MRSA by PCR NEGATIVE NEGATIVE Final    Comment:        The GeneXpert MRSA Assay (FDA approved for NASAL specimens only), is one component of a comprehensive MRSA colonization surveillance program. It is not intended to diagnose MRSA infection nor to guide or monitor treatment for MRSA infections.   Culture, respiratory (tracheal aspirate)     Status:  None   Collection Time: 09/10/16  4:03 PM  Result Value Ref Range Status   Specimen Description TRACHEAL ASPIRATE  Final   Special Requests NONE  Final   Gram Stain   Final  MODERATE WBC PRESENT,BOTH PMN AND MONONUCLEAR RARE BUDDING YEAST SEEN RARE GRAM NEGATIVE COCCI IN PAIRS    Culture FEW CANDIDA ALBICANS  Final   Report Status 09/13/2016 FINAL  Final  Culture, blood (routine x 2)     Status: None   Collection Time: 09/10/16  4:05 PM  Result Value Ref Range Status   Specimen Description BLOOD RIGHT FOOT  Final   Special Requests   Final    BOTTLES DRAWN AEROBIC ONLY Blood Culture results may not be optimal due to an inadequate volume of blood received in culture bottles   Culture NO GROWTH 5 DAYS  Final   Report Status 09/15/2016 FINAL  Final  Culture, blood (routine x 2)     Status: None   Collection Time: 09/10/16  4:20 PM  Result Value Ref Range Status   Specimen Description BLOOD LEFT FOOT  Final   Special Requests IN PEDIATRIC BOTTLE Blood Culture adequate volume  Final   Culture NO GROWTH 5 DAYS  Final   Report Status 09/15/2016 FINAL  Final  C difficile quick scan w PCR reflex     Status: None   Collection Time: 09/17/16  5:25 PM  Result Value Ref Range Status   C Diff antigen NEGATIVE NEGATIVE Final   C Diff toxin NEGATIVE NEGATIVE Final   C Diff interpretation No C. difficile detected.  Final     Studies: Ct Angio Chest Pe W Or Wo Contrast  Result Date: 09/19/2016 CLINICAL DATA:  Acute respiratory distress.  Elevated D-dimer. EXAM: CT ANGIOGRAPHY CHEST WITH CONTRAST TECHNIQUE: Multidetector CT imaging of the chest was performed using the standard protocol during bolus administration of intravenous contrast. Multiplanar CT image reconstructions and MIPs were obtained to evaluate the vascular anatomy. CONTRAST:  60 cc Isovue 370 IV COMPARISON:  Multiple prior radiographs most recently earlier this day. FINDINGS: Cardiovascular: There are no filling defects within the  pulmonary arteries to suggest pulmonary embolus. Lower lobe evaluation is obscured by cardiac motion artifact. The thoracic aorta is normal in caliber without dissection. Coronary artery calcifications are seen. Fluid adjacent to the left ventricle is likely pericardial effusion Mediastinum/Nodes: No mediastinal or hilar adenopathy. No axillary adenopathy. Enteric tube in place, tip coursing be on the field of view. The right internal jugular central line tip is obscured by dense contrast. Possible pericardial effusion versus tracking left pleural effusion adjacent to the left heart, cardiac motion obscures detailed evaluation. Lungs/Pleura: Dependent left lower lobe opacity with air bronchograms, small left pleural effusion. Right middle lobe opacity with air bronchograms and volume loss. Minimal dependent right lower lobe atelectasis. Trace right pleural effusion. No evidence of pulmonary edema. Mild emphysema. Minimal debris in the dependent trachea may be mucus or aspiration. Upper Abdomen: Cholecystectomy clips.  Fluid in the stomach. Musculoskeletal: There are no acute or suspicious osseous abnormalities. Review of the MIP images confirms the above findings. IMPRESSION: 1. No pulmonary embolus. 2. Small bilateral pleural effusions, left greater than right. 3. Consolidations with air bronchograms in the dependent left lower lobe, right middle lobe with volume loss and dependent right lower lobe, favored to be atelectasis; pneumonia or aspiration not excluded. 4. Minimal debris in the dependent trachea, may be mucus or aspiration. 5. Coronary artery calcifications.  Emphysema. Emphysema (ICD10-J43.9). Electronically Signed   By: Jeb Levering M.D.   On: 09/19/2016 06:01   Dg Chest Port 1 View  Result Date: 09/20/2016 CLINICAL DATA:  Aspiration.  Follow-up. EXAM: PORTABLE CHEST 1 VIEW COMPARISON:  09/19/2016.  FINDINGS: Nasogastric tube and right internal jugular central line are unchanged. There is  slightly better aeration in both lower lobes. Upper lungs remain clear. IMPRESSION: Mild radiographic improvement in the lower lobe aeration. Electronically Signed   By: Nelson Chimes M.D.   On: 09/20/2016 07:23   Dg Chest Port 1 View  Result Date: 09/19/2016 CLINICAL DATA:  Tachypnea. EXAM: PORTABLE CHEST 1 VIEW COMPARISON:  Chest CT 09/19/2016 at 5:34 a.m. FINDINGS: Cardiac silhouette is normal in size. No mediastinal or hilar masses. Right internal jugular central venous line tip projects in the upper superior vena cava. Nasal/orogastric tube passes well below the diaphragm into the stomach and below the included field of view. There is persistent lung base opacity consistent with atelectasis as noted on the current chest CT. No evidence of pulmonary edema. No pneumothorax. Skeletal structures are unremarkable. IMPRESSION: 1. Right internal jugular central venous line tip projects in the upper superior vena cava. No pneumothorax. 2. Well-positioned nasogastric tube. 3. Persistent lung base opacity consistent with atelectasis, stable from the current CT. No new lung abnormalities. Electronically Signed   By: Lajean Manes M.D.   On: 09/19/2016 17:32   Dg Chest Port 1 View  Result Date: 09/19/2016 CLINICAL DATA:  Tachypnea and hypoxia. EXAM: PORTABLE CHEST 1 VIEW COMPARISON:  Chest radiograph September 16, 2016 FINDINGS: Cardiomediastinal silhouette is normal. Persistent mildly elevated RIGHT hemidiaphragm with bibasilar strandy densities. Trace LEFT pleural effusion. No pneumothorax. RIGHT internal jugular central venous catheter distal tip projects in proximal superior vena cava. Nasogastric tube past proximal stomach, distal tip out of field-of-view. Surgical clips in the included right abdomen compatible with cholecystectomy. Soft tissue planes and included osseous structures are unchanged. IMPRESSION: Bibasilar atelectasis.  Trace LEFT pleural effusion. No apparent change in life-support lines.  Electronically Signed   By: Elon Alas M.D.   On: 09/19/2016 02:26   Scheduled Meds: . aspirin  162 mg Oral Daily  . Chlorhexidine Gluconate Cloth  6 each Topical Daily  . clonazePAM  0.5 mg Oral BID  . heparin  5,000 Units Subcutaneous Q8H  . insulin aspart  0-15 Units Subcutaneous TID WC  . ipratropium-albuterol  3 mL Nebulization Q6H  . mouth rinse  15 mL Mouth Rinse BID  . pantoprazole  40 mg Oral Q0600  . sodium chloride flush  10-40 mL Intracatheter Q12H   Continuous Infusions: . sodium chloride    . feeding supplement (VITAL AF 1.2 CAL) 1,000 mL (09/20/16 0053)  . meropenem (MERREM) IV Stopped (09/20/16 0001)  . methocarbamol (ROBAXIN)  IV 500 mg (09/17/16 1722)  . vancomycin Stopped (09/20/16 0311)   Principal Problem:   Septic shock (HCC) Active Problems:   Encounter for central line placement   ARDS (adult respiratory distress syndrome) (HCC)   Diarrhea   Severe protein-calorie malnutrition ([place])   Leukocytosis   Peritonitis ([place])  Critical Care Time spent: 41 mins  Irwin Brakeman, MD, FAAFP [facility] Pager [phone]  If 7PM-7AM, please contact night-coverage [email or web address] Password [email or web address] 09/20/2016, 7:29 AM    LOS: 10 days

## 2016-09-20 NOTE — Progress Notes (Signed)
eLink Physician-Brief Progress Note Patient Name: Brandi Leonard DOB: 02-Sep-1965 MRN: [id number]   Date of Service  09/20/2016  HPI/Events of Note  Repeat camera check at nursing request. Respiratory rate approximately 35 breaths per minute. Saturation approximately 95%. Patient opens eyes to voice from camera. Nods yes but does not answer with voice. Work of breathing appears the same to me.   eICU Interventions  Intensivist to assess patient this morning.      Intervention Category Major Interventions: Respiratory failure - evaluation and management  Tera Partridge 09/20/2016, 4:16 AM

## 2016-09-20 NOTE — Progress Notes (Addendum)
eLink Physician-Brief Progress Note Patient Name: Brandi Leonard DOB: 1965/04/26 MRN: [id number]   Date of Service  09/20/2016  HPI/Events of Note  Noted patient was tachypneic to approximately 40 prompting camera check. Patient has at least moderately increased work of breathing on nasal cannula. No paradoxical breathing appreciated. Patient with NG tube in place. Bedside nurse reports copious/voluminous liquid bowel movements with NG tube feedings. Febrile with no clear source. CT angiogram negative for pulmonary embolism but suggestive of possible pneumonia. Previously restarted on meropenem & was also on this before. Leukocytosis and thrombocytosis worsening. Tachycardic with normotension. Nurse reports she has notified cross covering provider for primary service previously. Blood cultures obtained on 6/24 at 8:20 AM already.   eICU Interventions  1. Intensivist nurse practitioner to assess patient at bedside 2. Hepatic function panel already ordered with a.m. labs 3. Checking amylase and lipase 4. Checking Procalcitonin per algorithm  5. Continuing Merrem & adding Vancomycin to regimen 6. Checking Beta-D Glucan & Blood Fungal culture - holding on anti-fungal agent for now  7. Ordering a venous duplex right upper extremity given internal jugular central venous catheter as well as bilateral lower extremity venous duplex      Intervention Category Major Interventions: Other:;Respiratory failure - evaluation and management  Tera Partridge 09/20/2016, 12:57 AM

## 2016-09-20 NOTE — Progress Notes (Signed)
Critical Value: Chloride >130 MD Titus Mould made aware. No new orders, D5W gtt just initiated. Will monitor  Lorre Munroe

## 2016-09-20 NOTE — Progress Notes (Signed)
**  Preliminary report by tech**  Right upper extremity venous duplex complete. There is no obvious evidence of deep or superficial vein thrombosis involving the right upper extremity. All clearly visualized vessels appear patent and compressible.   Bilateral lower extremity venous duplex completed. There is no evidence of deep or superficial vein thrombosis involving the right and left lower extremities. All visualized vessels appear patent and compressible. There is no evidence of Baker's cysts bilaterally.  09/20/16 1:31 PM Carlos Levering RVT

## 2016-09-20 NOTE — Progress Notes (Signed)
SLP Cancellation Note  Patient Details Name: Brandi Leonard MRN: [id number] DOB: 08/29/1965   Cancelled treatment:       Reason Eval/Treat Not Completed: Medical issues which prohibited therapy   [name] 09/20/2016, 11:01 AM

## 2016-09-21 ENCOUNTER — Inpatient Hospital Stay (HOSPITAL_COMMUNITY): Payer: BLUE CROSS/BLUE SHIELD

## 2016-09-21 LAB — PHOSPHORUS: PHOSPHORUS: 3.1 mg/dL (ref 2.5–4.6)

## 2016-09-21 LAB — CBC WITH DIFFERENTIAL/PLATELET
BASOS ABS: 0 10*3/uL (ref 0.0–0.1)
BASOS ABS: 0 10*3/uL (ref 0.0–0.1)
BASOS PCT: 0 %
BASOS PCT: 0 %
EOS ABS: 0 10*3/uL (ref 0.0–0.7)
EOS ABS: 0 10*3/uL (ref 0.0–0.7)
EOS PCT: 0 %
Eosinophils Relative: 0 %
HCT: 28.9 % — ABNORMAL LOW (ref 36.0–46.0)
HEMATOCRIT: 26.5 % — AB (ref 36.0–46.0)
Hemoglobin: 8.9 g/dL — ABNORMAL LOW (ref 12.0–15.0)
Hemoglobin: 7.9 g/dL — ABNORMAL LOW (ref 12.0–15.0)
Lymphocytes Relative: 10 %
Lymphocytes Relative: 11 %
Lymphs Abs: 2.5 10*3/uL (ref 0.7–4.0)
Lymphs Abs: 1.7 10*3/uL (ref 0.7–4.0)
MCH: 24.8 pg — ABNORMAL LOW (ref 26.0–34.0)
MCH: 24.5 pg — ABNORMAL LOW (ref 26.0–34.0)
MCHC: 30.8 g/dL (ref 30.0–36.0)
MCHC: 29.8 g/dL — ABNORMAL LOW (ref 30.0–36.0)
MCV: 80.5 fL (ref 78.0–100.0)
MCV: 82.3 fL (ref 78.0–100.0)
MONO ABS: 1 10*3/uL (ref 0.1–1.0)
MONO ABS: 0.6 10*3/uL (ref 0.1–1.0)
Monocytes Relative: 4 %
Monocytes Relative: 4 %
NEUTROS ABS: 12.6 10*3/uL — AB (ref 1.7–7.7)
Neutro Abs: 20.6 10*3/uL — ABNORMAL HIGH (ref 1.7–7.7)
Neutrophils Relative %: 86 %
Neutrophils Relative %: 85 %
PLATELETS: 790 10*3/uL — AB (ref 150–400)
PLATELETS: 633 10*3/uL — AB (ref 150–400)
RBC: 3.59 MIL/uL — AB (ref 3.87–5.11)
RBC: 3.22 MIL/uL — ABNORMAL LOW (ref 3.87–5.11)
RDW: 17.8 % — AB (ref 11.5–15.5)
RDW: 17.9 % — AB (ref 11.5–15.5)
WBC: 24.3 10*3/uL — AB (ref 4.0–10.5)
WBC: 14.9 10*3/uL — ABNORMAL HIGH (ref 4.0–10.5)

## 2016-09-21 LAB — VANCOMYCIN, TROUGH: Vancomycin Tr: 6 ug/mL — ABNORMAL LOW (ref 15–20)

## 2016-09-21 LAB — COMPREHENSIVE METABOLIC PANEL
ALBUMIN: 1.8 g/dL — AB (ref 3.5–5.0)
ALBUMIN: 1.7 g/dL — AB (ref 3.5–5.0)
ALK PHOS: 252 U/L — AB (ref 38–126)
ALT: 84 U/L — AB (ref 14–54)
ALT: 85 U/L — ABNORMAL HIGH (ref 14–54)
AST: 57 U/L — ABNORMAL HIGH (ref 15–41)
AST: 41 U/L (ref 15–41)
Alkaline Phosphatase: 190 U/L — ABNORMAL HIGH (ref 38–126)
Anion gap: 7 (ref 5–15)
Anion gap: 5 (ref 5–15)
BILIRUBIN TOTAL: 0.3 mg/dL (ref 0.3–1.2)
BILIRUBIN TOTAL: 0.3 mg/dL (ref 0.3–1.2)
BUN: 22 mg/dL — ABNORMAL HIGH (ref 6–20)
BUN: 17 mg/dL (ref 6–20)
CALCIUM: 7.8 mg/dL — AB (ref 8.9–10.3)
CHLORIDE: 113 mmol/L — AB (ref 101–111)
CO2: 28 mmol/L (ref 22–32)
CO2: 29 mmol/L (ref 22–32)
CREATININE: 0.45 mg/dL (ref 0.44–1.00)
Calcium: 7.6 mg/dL — ABNORMAL LOW (ref 8.9–10.3)
Chloride: 114 mmol/L — ABNORMAL HIGH (ref 101–111)
Creatinine, Ser: 0.42 mg/dL — ABNORMAL LOW (ref 0.44–1.00)
GFR calc Af Amer: >60 mL/min (ref >60)
GFR calc Af Amer: >60 mL/min (ref >60)
GFR calc non Af Amer: >60 mL/min (ref >60)
GFR calc non Af Amer: >60 mL/min (ref >60)
GLUCOSE: 152 mg/dL — AB (ref 65–99)
GLUCOSE: 115 mg/dL — AB (ref 65–99)
POTASSIUM: 3.9 mmol/L (ref 3.5–5.1)
Potassium: 2.5 mmol/L — CL (ref 3.5–5.1)
SODIUM: 149 mmol/L — AB (ref 135–145)
SODIUM: 147 mmol/L — AB (ref 135–145)
Total Protein: 4.9 g/dL — ABNORMAL LOW (ref 6.5–8.1)
Total Protein: 4.8 g/dL — ABNORMAL LOW (ref 6.5–8.1)

## 2016-09-21 LAB — BASIC METABOLIC PANEL
ANION GAP: 7 (ref 5–15)
Anion gap: 9 (ref 5–15)
BUN: 18 mg/dL (ref 6–20)
BUN: 16 mg/dL (ref 6–20)
CALCIUM: 7.8 mg/dL — AB (ref 8.9–10.3)
CALCIUM: 7.5 mg/dL — AB (ref 8.9–10.3)
CO2: 29 mmol/L (ref 22–32)
CO2: 30 mmol/L (ref 22–32)
CREATININE: 0.48 mg/dL (ref 0.44–1.00)
CREATININE: 0.45 mg/dL (ref 0.44–1.00)
Chloride: 111 mmol/L (ref 101–111)
Chloride: 111 mmol/L (ref 101–111)
GFR calc Af Amer: >60 mL/min (ref >60)
GFR calc Af Amer: >60 mL/min (ref >60)
GFR calc non Af Amer: >60 mL/min (ref >60)
GFR calc non Af Amer: >60 mL/min (ref >60)
GLUCOSE: 129 mg/dL — AB (ref 65–99)
Glucose, Bld: 111 mg/dL — ABNORMAL HIGH (ref 65–99)
Potassium: 3.6 mmol/L (ref 3.5–5.1)
Potassium: 4 mmol/L (ref 3.5–5.1)
Sodium: 149 mmol/L — ABNORMAL HIGH (ref 135–145)
Sodium: 148 mmol/L — ABNORMAL HIGH (ref 135–145)

## 2016-09-21 LAB — PROCALCITONIN
Procalcitonin: 0.27 ng/mL
Procalcitonin: 0.23 ng/mL

## 2016-09-21 LAB — GLUCOSE, CAPILLARY
Glucose-Capillary: 114 mg/dL — ABNORMAL HIGH (ref 65–99)
Glucose-Capillary: 120 mg/dL — ABNORMAL HIGH (ref 65–99)
Glucose-Capillary: 110 mg/dL — ABNORMAL HIGH (ref 65–99)

## 2016-09-21 LAB — MAGNESIUM
MAGNESIUM: 1.6 mg/dL — AB (ref 1.7–2.4)
MAGNESIUM: 2.3 mg/dL (ref 1.7–2.4)

## 2016-09-21 MED ORDER — POTASSIUM CHLORIDE 10 MEQ/50ML IV SOLN
10.0000 meq | INTRAVENOUS | Status: AC
  Administered 2016-09-21 (×4): 10 meq via INTRAVENOUS
  Filled 2016-09-21 (×4): qty 50

## 2016-09-21 MED ORDER — MORPHINE SULFATE (PF) 2 MG/ML IV SOLN
2.0000 mg | INTRAVENOUS | Status: DC | PRN
  Administered 2016-09-23: 4 mg via INTRAVENOUS
  Administered 2016-09-23: 2 mg via INTRAVENOUS
  Administered 2016-09-23 – 2016-09-24 (×3): 4 mg via INTRAVENOUS
  Filled 2016-09-21: qty 1
  Filled 2016-09-21 (×4): qty 2

## 2016-09-21 MED ORDER — FENTANYL CITRATE (PF) 100 MCG/2ML IJ SOLN
INTRAMUSCULAR | Status: AC | PRN
  Administered 2016-09-21: 50 ug via INTRAVENOUS

## 2016-09-21 MED ORDER — HEPARIN SODIUM (PORCINE) 5000 UNIT/ML IJ SOLN
5000.0000 [IU] | Freq: Three times a day (TID) | INTRAMUSCULAR | Status: DC
  Administered 2016-09-22 – 2016-09-25 (×11): 5000 [IU] via SUBCUTANEOUS
  Filled 2016-09-21 (×11): qty 1

## 2016-09-21 MED ORDER — POTASSIUM CHLORIDE 20 MEQ/15ML (10%) PO SOLN
40.0000 meq | Freq: Three times a day (TID) | ORAL | Status: AC
  Administered 2016-09-21 (×2): 40 meq
  Filled 2016-09-21 (×2): qty 30

## 2016-09-21 MED ORDER — VANCOMYCIN HCL IN DEXTROSE 1-5 GM/200ML-% IV SOLN
1000.0000 mg | Freq: Three times a day (TID) | INTRAVENOUS | Status: DC
  Administered 2016-09-21 – 2016-09-22 (×2): 1000 mg via INTRAVENOUS
  Filled 2016-09-21 (×3): qty 200

## 2016-09-21 MED ORDER — CLONAZEPAM 0.5 MG PO TBDP
0.5000 mg | ORAL_TABLET | Freq: Two times a day (BID) | ORAL | Status: DC
  Administered 2016-09-21 – 2016-09-27 (×12): 0.5 mg
  Filled 2016-09-21 (×12): qty 1

## 2016-09-21 MED ORDER — CHLORHEXIDINE GLUCONATE 0.12 % MT SOLN
15.0000 mL | Freq: Two times a day (BID) | OROMUCOSAL | Status: DC
  Administered 2016-09-21 – 2016-09-29 (×16): 15 mL via OROMUCOSAL
  Filled 2016-09-21 (×8): qty 15

## 2016-09-21 MED ORDER — LACTATED RINGERS IV SOLN
INTRAVENOUS | Status: DC
  Administered 2016-09-21 – 2016-09-22 (×3): via INTRAVENOUS

## 2016-09-21 MED ORDER — LIDOCAINE HCL (PF) 1 % IJ SOLN
INTRAMUSCULAR | Status: AC
  Filled 2016-09-21: qty 30

## 2016-09-21 MED ORDER — FENTANYL CITRATE (PF) 100 MCG/2ML IJ SOLN
INTRAMUSCULAR | Status: AC
  Filled 2016-09-21: qty 2

## 2016-09-21 MED ORDER — MIDAZOLAM HCL 2 MG/2ML IJ SOLN
INTRAMUSCULAR | Status: AC | PRN
  Administered 2016-09-21: 1 mg via INTRAVENOUS

## 2016-09-21 MED ORDER — ORAL CARE MOUTH RINSE
15.0000 mL | Freq: Two times a day (BID) | OROMUCOSAL | Status: DC
  Administered 2016-09-21 – 2016-09-24 (×8): 15 mL via OROMUCOSAL

## 2016-09-21 MED ORDER — MIDAZOLAM HCL 2 MG/2ML IJ SOLN
INTRAMUSCULAR | Status: AC
  Filled 2016-09-21: qty 2

## 2016-09-21 MED ORDER — MAGNESIUM SULFATE 4 GM/100ML IV SOLN
4.0000 g | Freq: Once | INTRAVENOUS | Status: AC
  Administered 2016-09-21: 4 g via INTRAVENOUS
  Filled 2016-09-21 (×2): qty 100

## 2016-09-21 NOTE — Progress Notes (Signed)
PULMONARY / CRITICAL CARE MEDICINE   Name: Brandi Leonard MRN: [id number] DOB: Dec 20, 1965    ADMISSION DATE:  09/10/2016 CONSULTATION DATE:  09/10/2016  REFERRING MD:  Dr. Ger[facility]  CHIEF COMPLAINT:  shock  HISTORY OF PRESENT ILLNESS:   51 year old admitted to [facility] 6/11 with Small bowel obstruction, underwent emergent laparotomy on 6/12 for perforation with peritonitis. Failed extubation postoperatively and then developed diffuse bilateral infiltrates consistent with ARDS and septic shock requiring Levophed.  Extubated 6/21.  On 6/25 developed worsening respiratory failure, ?aspiration v HCAP, large volume stool output.  PCCM reconsulted.   STUDIES:  CT abdomen 6/11 > multiple dilated loops of fluid filled small bowel consistent with mechanical small bowel obstruction Echo 6/16 >> nml LV fn, mild apical pericardial effusion CTA chest 6/24>>> Neg PE, L>R bilat effusions, LLL consolidation with air bronchograms, RML volume loss  BLE venous doppler 6/25>>> neg  CT abd 6/25: Colon is diffusely dilated with fluid despite having a rectal tube. No significant small bowel dilatation.There are two fluid collections in the pelvis which were likely present prior to the recent surgery. The largest is in the left lower quadrant measuring over 7 cm and there is a smaller complex collection in the right anterior pelvic region measuring 4.4 cm. These could be adnexal cystic structures but indeterminate. Ideally these areas could be further characterized with a pelvic ultrasound but may be difficult with the fluid-filled colon. There are two new postoperative fluid collections along the lateral abdomen. These could represent pockets of loculated ascites or abscess collections. Trace left pleural fluid with consolidation or volume loss in the left lower lobe. Volume loss in the right lower lobe.  CULTURES: Blood 6/12 > Coag neg staph 1/2 bottles BC x 2 6/15>>> NEG Sputum 6/15>> few  candida CDiff 6/22>>> neg  BC x 2 6/24>>>   ANTIBIOTICS: Primaxin 6/12 > 6/21 Levaquin 6/12 >> 6/12 Flagyl 6/12 > 6/21 Meropenem 6/24>>>  Vanc 6/25>>>  anidulo 6/25>>>  SIGNIFICANT EVENTS: 6/11 admit for SBO 6/12 perf to OR for repair 6/13 contnue shock, resp failure 6/15 transfer to [facility] for ICU admit. 6/16 started diuresis 6/25- worsening resp status  LINES/TUBES: ETT 6/13 >6/21 R fem CVL 6/12 >6/15 Art line rt radial 6/15 > 6/21 IJ CVL 6/15>>>  SUBJECTIVE:  No sig fever spike, wbc up a little  More hypoxic   VITAL SIGNS: BP 113/73 (BP Location: Left Arm)   Pulse (!) 116   Temp 99.3 F (37.4 C) (Oral)   Resp 20   Ht 5\' 5"  (1.651 m)   Wt 107 lb 9.4 oz (48.8 kg)   LMP 09/16/2016   SpO2 96%   BMI 17.90 kg/m   HEMODYNAMICS:    VENTILATOR SETTINGS:    INTAKE / OUTPUT: I/O last 3 completed shifts: In: 8404.2 [I.V.:5569.2; NG/GT:875; IV Piggyback:1960] Out: 4400 [Urine:2300; Stool:2100]  PHYSICAL EXAMINATION: Physical Exam  Constitutional: She is oriented to person, place, and time. She appears well-developed and well-nourished. No distress.  HENT:  Head: Normocephalic and atraumatic.  NGT in nare  Eyes: Conjunctivae are normal. Pupils are equal, round, and reactive to light.  Neck: Normal range of motion. Neck supple. No JVD present. No tracheal deviation present.  Cardiovascular: Normal heart sounds and normal pulses.  Tachycardia present.   No murmur heard. Pulmonary/Chest: Accessory muscle usage present. Tachypnea noted. No respiratory distress. She has decreased breath sounds.  Abdominal: Soft. Bowel sounds are increased. There is generalized tenderness.  abd wound vac in place  Genitourinary:  Genitourinary Comments: Clear yellow urine   Musculoskeletal: Normal range of motion.  Neurological: She is oriented to person, place, and time. She has normal strength. No cranial nerve deficit or sensory deficit. GCS eye subscore is 4. GCS verbal subscore  is 5. GCS motor subscore is 6.  Skin: Skin is warm and dry. She is not diaphoretic.    LABS:  BMET  Recent Labs Lab 09/20/16 0914 09/20/16 2039 09/21/16 0250  NA 157* 152* 149*  K 4.2 3.0* 2.5*  CL >130* 123* 114*  CO2 19* 22 28  BUN 36* 23* 22*  CREATININE 0.59 0.46 0.45  GLUCOSE 215* 109* 152*   Electrolytes  Recent Labs Lab 09/18/16 0430 09/19/16 0219 09/19/16 0245 09/20/16 0329 09/20/16 0914 09/20/16 2039 09/21/16 0250  CALCIUM 7.9*  --  8.5* 8.3* 8.6* 8.2* 7.8*  MG 1.7 1.7  --  2.0  --   --  1.6*  PHOS 3.5  --  3.7  --   --   --  3.1    CBC  Recent Labs Lab 09/19/16 0219 09/20/16 0329 09/21/16 0250  WBC 25.1* 22.2* 24.3*  HGB 10.0* 9.9* 8.9*  HCT 32.0* 32.2* 28.9*  PLT 996* 1,021* 790*   Coag's No results for input(s): APTT, INR in the last 168 hours. Sepsis Markers  Recent Labs Lab 09/18/16 0430 09/19/16 1608 09/20/16 0125 09/20/16 0914 09/21/16 0250  LATICACIDVEN  --  1.2  --  1.2  --   PROCALCITON 0.26  --  0.35  --  0.27   ABG  Recent Labs Lab 09/19/16 1704 09/20/16 0640  PHART 7.384 7.377  PCO2ART 20.3* 20.3*  PO2ART 94.0 95.9   Liver Enzymes  Recent Labs Lab 09/16/16 0350  09/19/16 0245 09/20/16 0329 09/21/16 0250  AST 17  --   --  26 57*  ALT 19  --   --  53 84*  ALKPHOS 59  --   --  348* 252*  BILITOT 0.2*  --   --  0.6 0.3  ALBUMIN 1.4*  < > 2.0* 2.1* 1.8*  < > = values in this interval not displayed.  Cardiac Enzymes  Recent Labs Lab 09/19/16 0219 09/19/16 1100 09/19/16 1608  TROPONINI 0.04* 0.05* 0.05*   Glucose  Recent Labs Lab 09/19/16 1637 09/19/16 1928 09/20/16 0809 09/20/16 1205 09/20/16 1606 09/20/16 2107  GLUCAP 176* 172* 218* 115* 180* 103*    Imaging Ct Abdomen Pelvis W Contrast  Result Date: 09/20/2016 CLINICAL DATA:  Sepsis and abdominal pain. History of bowel obstruction and underwent emergent laparotomy on 06/12 for perforation with peritonitis. Complex medical history with  history of bladder repair and small bowel obstructions. EXAM: CT ABDOMEN AND PELVIS WITH CONTRAST TECHNIQUE: Multidetector CT imaging of the abdomen and pelvis was performed using the standard protocol following bolus administration of intravenous contrast. CONTRAST:  80 mL Isovue 300 COMPARISON:  CT 09/06/2016 FINDINGS: Lower chest: Trace left pleural fluid. Volume loss at both lung bases with some consolidation in the left lower lobe. Hepatobiliary: Gallbladder has been removed. Normal appearance of the liver. Portal venous system is patent. Pancreas: Normal appearance of the pancreas without inflammation or duct dilatation. Spleen: Normal appearance of spleen without enlargement. Adrenals/Urinary Tract: Normal adrenal glands. Urinary bladder is decompressed with a catheter. Normal appearance both kidneys without hydronephrosis. Stomach/Bowel: Orogastric tube extends into the stomach and terminates near the proximal duodenum. Stomach is not distended. There is oral contrast within the small bowel and no significant small bowel  dilatation. The colon is diffusely distended, mostly with fluid. Rectum is distended with a large amount of fluid and there is a rectal catheter with a balloon. Surgical bowel anastomotic clips in the right lower quadrant probably involving the distal ileum. Vascular/Lymphatic: Atherosclerotic calcifications in the abdominal aorta without aneurysm. No significant lymph node enlargement in the abdomen or pelvis. Reproductive: Uterus is present with heterogeneous enhancement. There is a possible left adnexal structure measuring up to 7.7 cm. This structure is slightly dense and this was present on the preoperative study. Findings are concerning for a large complex left adnexal or ovarian cyst. There is also a complex low-density structure in the right lower quadrant which may have been present on the preoperative study as well. This area measures roughly 4.4 x 3.7 cm on sequence 3, images 79.  Inflammatory changes in this area probably related to recent surgery. Other: Inflammatory changes in the right lower quadrant related to recent surgery. There appears to be an extraluminal fluid collection in the left lateral abdomen measuring 4.2 x 2.2 x 3.5 cm on sequence 2, image 43. This could represent a small abscess collection. Similarly, there is a crescent-shaped fluid collection along the posterior abdomen just inferior to the liver. This structure measures 5.1 x 1.8 x 5.9 cm. This could also represent loculated ascites or abscess collection. Musculoskeletal: No acute bone abnormality. IMPRESSION: Colon is diffusely dilated with fluid despite having a rectal tube. No significant small bowel dilatation. There are two fluid collections in the pelvis which were likely present prior to the recent surgery. The largest is in the left lower quadrant measuring over 7 cm and there is a smaller complex collection in the right anterior pelvic region measuring 4.4 cm. These could be adnexal cystic structures but indeterminate. Ideally these areas could be further characterized with a pelvic ultrasound but may be difficult with the fluid-filled colon. There are two new postoperative fluid collections along the lateral abdomen. These could represent pockets of loculated ascites or abscess collections. Trace left pleural fluid with consolidation or volume loss in the left lower lobe. Volume loss in the right lower lobe. Electronically Signed   By: Markus Daft M.D.   On: 09/20/2016 16:53   Dg Chest Portable 1 View  Result Date: 09/21/2016 CLINICAL DATA:  51 year old female with pneumonia and shortness breath. Subsequent encounter. EXAM: PORTABLE CHEST 1 VIEW COMPARISON:  09/20/2016. FINDINGS: Right central line tip proximal superior vena cava level. Nasogastric tube courses below the diaphragm. Tip is not included on the present exam. Progressive consolidation retrocardiac region may represent progressive infiltrate,  atelectasis and/or pleural effusion. Subsegmental atelectasis right lung base. Heart size top-normal. IMPRESSION: Progressive consolidation retrocardiac region may represent progressive infiltrate, atelectasis and/or pleural effusion. Subsegmental atelectasis right lung base. Electronically Signed   By: Genia Del M.D.   On: 09/21/2016 07:02   Dg Abd Portable 1v  Result Date: 09/20/2016 CLINICAL DATA:  Abdominal distension EXAM: PORTABLE ABDOMEN - 1 VIEW COMPARISON:  Portable exam 0931 hours compared to 09/16/2016 FINDINGS: Tip of nasogastric tube projects over pyloric region. Surgical clips RIGHT upper quadrant likely cholecystectomy. Gas in stomach and throughout a normal caliber colon. No small bowel distention. Anastomotic staple line RIGHT mid abdomen. No definite bowel wall thickening. Degenerative changes lower lumbar spine. No urinary tract calcification. IMPRESSION: Gas throughout nondistended colon and within stomach. Electronically Signed   By: Lavonia Dana M.D.   On: 09/20/2016 09:48    DISCUSSION:  51yo female post op emergent exp lap  for SBO with perforation and peritonitis, respiratory failure/ARDS and septic shock.  Extubated 6/21, now with worsening respiratory failure r/t aspiration v HCAP.   6/26: suspect that abd abscess is driving clinical decline. Will sp w/ surgical team. Will need IR to help. Cont abx and antifungals. Cont supportive care.   ASSESSMENT / PLAN:  PULMONARY A: Acute hypoxemic respiratory failure initially in setting of aspiration pna and ards (extubated 6/21) COPD without acute exacerbation ->pcxr personally reviewed 6/26: rotated film. Basilar atx. No sig change (CT abd w/ bibasilar atx and small left effusion) ->LE dopplers negative  ->atx d/t abd pain and hypoventilation biggest contributing factor to hypoxia here  P:   Wean O2 rx ileus pulm hygiene  Holding tubefeeds   CARDIOVASCULAR A:  Shock: likely septic due to peritonitis - resolved.   Demand ischemia -trop peak at 1.0 H/o HLD Tachycardia - volume status concerns SIRS  P:  Cont tele Cont IVFs (see below) PRN lopressor  RENAL A:   Fluid and electrolyte d/o: hypernatremia (improving), hyperchloremia (improving),  hypokalemia & hypomagnesemia  NONAG -->improved/resolved P:   Dc bicarb & 1/2ns, change MIVF to LR  Aggressive KCL replacement, replace Mg Repeat chemistry this afternoon Strict I&O  GASTROINTESTINAL A:   SBO complicated by perforation now s/p repair 6/12; c/b post op abd pain, colitis and what appears to be intra-abd abscess by CT scan 6/25 Severe protein cal malnutrition Large volume stool output - CDiff neg  Shock liver  P:   Trend LFTs NPO Wound rx per surg (wound vac) ? IR for perc drain ??  HEMATOLOGIC A:   Anemia of critical illness. No evidence of bleeding  Thrombocytosis  P:  Tend cbc Transfuse per protocol Cont [place] heparin   INFECTIOUS A:   Septic shock secondary to peritonitis - resolved  W/ on-going sepsis in setting of intra-abd abscess  P:   Day # 2 meropenem and vanc (second course of abx as had been stopped 6/21)  ENDOCRINE A:   Relative Adrenal insufficiency (cortisol 12 at [place])  Intermittent hyperglycemia  P:   ssi   NEUROLOGIC A:   Acute metabolic encephalopathy Occasional jerking movements -resolved Seizure - ?benzo withdrawal  Post op pain  P:   Cont clonazepam as tODT PRN analgesia w/ fent  Watch for evidence of seizure  FAMILY  - Updates: no family at bedside 6/25    My cct [place] ACNP-BC [place] Pager # [phone] OR # [phone] if no answer  STAFF NOTE: I, Merrie Roof, MD FACP have personally reviewed patient's available data, including medical history, events of note, physical examination and test results as part of my evaluation. I have discussed with resident/NP and other care providers such as pharmacist, RN and RRT. In addition, I  personally evaluated patient and elicited key findings of: awake, more alert, a lot less distress, drop ino RR , lungs without crackles, soft abdo, wound vac clean, distention, pcxr with some int haziness small layering effusiosn left likely which I reviewed, I also reviewed CT with multiple abdo abscess, needs immediate drainage, IR recommended by surgery, nonag resolved, dc bicarb, supp k, re assess lytes, supp mag, maintain current abx and antifungals, Na corrected, maintain to LR at good rate, chem in am , I updated pt and family, I would keep her herein icu until drainage and follow for sirs after, avoid tpn, would NOT slow bowels with air fluid levels and current circumstance, just kee pos and  may need rate increase The patient is critically ill with multiple organ systems failure and requires high complexity decision making for assessment and support, frequent evaluation and titration of therapies, application of advanced monitoring technologies and extensive interpretation of multiple databases.   Critical Care Time devoted to patient care services described in this note is 30 Minutes. This time reflects time of care of this signee: Merrie Roof, MD FACP. This critical care time does not reflect procedure time, or teaching time or supervisory time of PA/NP/Med student/Med Resident etc but could involve care discussion time. Rest per NP/medical resident whose note is outlined above and that I agree with   Lavon Paganini. Titus Mould, MD, [place] Pgr: [facility] 09/21/2016 10:02 AM

## 2016-09-21 NOTE — Progress Notes (Signed)
SLP Cancellation Note  Patient Details Name: Brandi Leonard MRN: [id number] DOB: Aug 03, 1965   Cancelled treatment:       Reason Eval/Treat Not Completed: Medical issues which prohibited therapy   [name] 09/21/2016, 12:17 PM

## 2016-09-21 NOTE — Progress Notes (Signed)
[facility]/Trauma Progress Note      Subjective:  CC: difficulty breathing, abdominal pain  Pt states it is still difficult to breath. She is still having abdominal pain and the pain medicine is not helping. Pain is unchanged from yesterday. No family at bedside. Pt states no prior history of ovarian cysts. Discussed results of CT scan with pt.   Objective: Vital signs in last 24 hours: Temp:  [97 F (36.1 C)-100.6 F (38.1 C)] 99.3 F (37.4 C) (06/26 0728) Pulse Rate:  [108-138] 108 (06/26 0600) Resp:  [26-40] 29 (06/26 0600) BP: (101-137)/(58-88) 115/68 (06/26 0600) SpO2:  [67 %-98 %] 96 % (06/26 0818) Weight:  [107 lb 9.4 oz (48.8 kg)] 107 lb 9.4 oz (48.8 kg) (06/26 0328) Last BM Date: 09/19/16  Intake/Output from previous day: 06/25 0701 - 06/26 0700 In: 2542.7 [I.V.:4539.2; NG/GT:65; IV Piggyback:1460] Out: 1500 [Urine:1300; Stool:200] Intake/Output this shift: No intake/output data recorded.  PE: Gen: Alert, cooperative, more alert today HEENT: pupils equal, conjunctiva normal, no scleral icterus Card: tachycardic, regular rhythm, no M/G/R heard Pulm: rate and effort mildly increased, CTA Abd: Soft, distended, hyperactive BS, midline incisions with vac in place, moderate generalized TTP without guarding or signs of peritonitis Skin: warm and dry  Lab Results:   Recent Labs  09/20/16 0329 09/21/16 0250  WBC 22.2* 24.3*  HGB 9.9* 8.9*  HCT 32.2* 28.9*  PLT 1,021* 790*   BMET  Recent Labs  09/20/16 2039 09/21/16 0250  NA 152* 149*  K 3.0* 2.5*  CL 123* 114*  CO2 22 28  GLUCOSE 109* 152*  BUN 23* 22*  CREATININE 0.46 0.45  CALCIUM 8.2* 7.8*   PT/INR No results for input(s): LABPROT, INR in the last 72 hours. CMP     Component Value Date/Time   NA 149 (H) 09/21/2016 0250   NA 138 03/11/2012 0326   K 2.5 (LL) 09/21/2016 0250   K 3.6 03/11/2012 0326   CL 114 (H) 09/21/2016 0250   CL 106 03/11/2012 0326   CO2 28 09/21/2016 0250    CO2 25 03/11/2012 0326   GLUCOSE 152 (H) 09/21/2016 0250   GLUCOSE 97 03/11/2012 0326   BUN 22 (H) 09/21/2016 0250   BUN 4 (L) 03/11/2012 0326   CREATININE 0.45 09/21/2016 0250   CREATININE 1.83 (H) 03/15/2012 0449   CALCIUM 7.8 (L) 09/21/2016 0250   CALCIUM 8.1 (L) 03/11/2012 0326   PROT 4.9 (L) 09/21/2016 0250   PROT 8.1 03/05/2012 1834   ALBUMIN 1.8 (L) 09/21/2016 0250   ALBUMIN 4.7 03/05/2012 1834   AST 57 (H) 09/21/2016 0250   AST 19 03/05/2012 1834   ALT 84 (H) 09/21/2016 0250   ALT 20 03/05/2012 1834   ALKPHOS 252 (H) 09/21/2016 0250   ALKPHOS 146 (H) 03/05/2012 1834   BILITOT 0.3 09/21/2016 0250   BILITOT 0.6 03/05/2012 1834   GFRNONAA >60 09/21/2016 0250   GFRNONAA 33 (L) 03/15/2012 0449   GFRAA >60 09/21/2016 0250   GFRAA 38 (L) 03/15/2012 0449   Lipase     Component Value Date/Time   LIPASE 110 (H) 09/20/2016 0125   LIPASE 69 (L) 03/05/2012 1834    Studies/Results: Ct Abdomen Pelvis W Contrast  Result Date: 09/20/2016 CLINICAL DATA:  Sepsis and abdominal pain. History of bowel obstruction and underwent emergent laparotomy on 06/12 for perforation with peritonitis. Complex medical history with history of bladder repair and small bowel obstructions. EXAM: CT ABDOMEN AND PELVIS WITH CONTRAST TECHNIQUE: Multidetector CT imaging of  the abdomen and pelvis was performed using the standard protocol following bolus administration of intravenous contrast. CONTRAST:  80 mL Isovue 300 COMPARISON:  CT 09/06/2016 FINDINGS: Lower chest: Trace left pleural fluid. Volume loss at both lung bases with some consolidation in the left lower lobe. Hepatobiliary: Gallbladder has been removed. Normal appearance of the liver. Portal venous system is patent. Pancreas: Normal appearance of the pancreas without inflammation or duct dilatation. Spleen: Normal appearance of spleen without enlargement. Adrenals/Urinary Tract: Normal adrenal glands. Urinary bladder is decompressed with a catheter.  Normal appearance both kidneys without hydronephrosis. Stomach/Bowel: Orogastric tube extends into the stomach and terminates near the proximal duodenum. Stomach is not distended. There is oral contrast within the small bowel and no significant small bowel dilatation. The colon is diffusely distended, mostly with fluid. Rectum is distended with a large amount of fluid and there is a rectal catheter with a balloon. Surgical bowel anastomotic clips in the right lower quadrant probably involving the distal ileum. Vascular/Lymphatic: Atherosclerotic calcifications in the abdominal aorta without aneurysm. No significant lymph node enlargement in the abdomen or pelvis. Reproductive: Uterus is present with heterogeneous enhancement. There is a possible left adnexal structure measuring up to 7.7 cm. This structure is slightly dense and this was present on the preoperative study. Findings are concerning for a large complex left adnexal or ovarian cyst. There is also a complex low-density structure in the right lower quadrant which may have been present on the preoperative study as well. This area measures roughly 4.4 x 3.7 cm on sequence 3, images 79. Inflammatory changes in this area probably related to recent surgery. Other: Inflammatory changes in the right lower quadrant related to recent surgery. There appears to be an extraluminal fluid collection in the left lateral abdomen measuring 4.2 x 2.2 x 3.5 cm on sequence 2, image 43. This could represent a small abscess collection. Similarly, there is a crescent-shaped fluid collection along the posterior abdomen just inferior to the liver. This structure measures 5.1 x 1.8 x 5.9 cm. This could also represent loculated ascites or abscess collection. Musculoskeletal: No acute bone abnormality. IMPRESSION: Colon is diffusely dilated with fluid despite having a rectal tube. No significant small bowel dilatation. There are two fluid collections in the pelvis which were likely  present prior to the recent surgery. The largest is in the left lower quadrant measuring over 7 cm and there is a smaller complex collection in the right anterior pelvic region measuring 4.4 cm. These could be adnexal cystic structures but indeterminate. Ideally these areas could be further characterized with a pelvic ultrasound but may be difficult with the fluid-filled colon. There are two new postoperative fluid collections along the lateral abdomen. These could represent pockets of loculated ascites or abscess collections. Trace left pleural fluid with consolidation or volume loss in the left lower lobe. Volume loss in the right lower lobe. Electronically Signed   By: Markus Daft M.D.   On: 09/20/2016 16:53   Dg Chest Portable 1 View  Result Date: 09/21/2016 CLINICAL DATA:  51 year old female with pneumonia and shortness breath. Subsequent encounter. EXAM: PORTABLE CHEST 1 VIEW COMPARISON:  09/20/2016. FINDINGS: Right central line tip proximal superior vena cava level. Nasogastric tube courses below the diaphragm. Tip is not included on the present exam. Progressive consolidation retrocardiac region may represent progressive infiltrate, atelectasis and/or pleural effusion. Subsegmental atelectasis right lung base. Heart size top-normal. IMPRESSION: Progressive consolidation retrocardiac region may represent progressive infiltrate, atelectasis and/or pleural effusion. Subsegmental atelectasis right lung  base. Electronically Signed   By: Genia Del M.D.   On: 09/21/2016 07:02   Dg Chest Port 1 View  Result Date: 09/20/2016 CLINICAL DATA:  Aspiration.  Follow-up. EXAM: PORTABLE CHEST 1 VIEW COMPARISON:  09/19/2016. FINDINGS: Nasogastric tube and right internal jugular central line are unchanged. There is slightly better aeration in both lower lobes. Upper lungs remain clear. IMPRESSION: Mild radiographic improvement in the lower lobe aeration. Electronically Signed   By: Nelson Chimes M.D.   On: 09/20/2016  07:23   Dg Chest Port 1 View  Result Date: 09/19/2016 CLINICAL DATA:  Tachypnea. EXAM: PORTABLE CHEST 1 VIEW COMPARISON:  Chest CT 09/19/2016 at 5:34 a.m. FINDINGS: Cardiac silhouette is normal in size. No mediastinal or hilar masses. Right internal jugular central venous line tip projects in the upper superior vena cava. Nasal/orogastric tube passes well below the diaphragm into the stomach and below the included field of view. There is persistent lung base opacity consistent with atelectasis as noted on the current chest CT. No evidence of pulmonary edema. No pneumothorax. Skeletal structures are unremarkable. IMPRESSION: 1. Right internal jugular central venous line tip projects in the upper superior vena cava. No pneumothorax. 2. Well-positioned nasogastric tube. 3. Persistent lung base opacity consistent with atelectasis, stable from the current CT. No new lung abnormalities. Electronically Signed   By: Lajean Manes M.D.   On: 09/19/2016 17:32   Dg Abd Portable 1v  Result Date: 09/20/2016 CLINICAL DATA:  Abdominal distension EXAM: PORTABLE ABDOMEN - 1 VIEW COMPARISON:  Portable exam 0931 hours compared to 09/16/2016 FINDINGS: Tip of nasogastric tube projects over pyloric region. Surgical clips RIGHT upper quadrant likely cholecystectomy. Gas in stomach and throughout a normal caliber colon. No small bowel distention. Anastomotic staple line RIGHT mid abdomen. No definite bowel wall thickening. Degenerative changes lower lumbar spine. No urinary tract calcification. IMPRESSION: Gas throughout nondistended colon and within stomach. Electronically Signed   By: Lavonia Dana M.D.   On: 09/20/2016 09:48    Anti-infectives: Anti-infectives    Start     Dose/Rate Route Frequency Ordered Stop   09/21/16 1100  anidulafungin (ERAXIS) 100 mg in sodium chloride 0.9 % 100 mL IVPB     100 mg 78 mL/hr over 100 Minutes Intravenous Every 24 hours 09/20/16 1048     09/20/16 1100  anidulafungin (ERAXIS) 200 mg in  sodium chloride 0.9 % 200 mL IVPB     200 mg 78 mL/hr over 200 Minutes Intravenous  Once 09/20/16 1048 09/20/16 1510   09/20/16 0145  vancomycin (VANCOCIN) IVPB 750 mg/150 ml premix     750 mg 150 mL/hr over 60 Minutes Intravenous Every 12 hours 09/20/16 0135     09/19/16 0800  meropenem (MERREM) 1 g in sodium chloride 0.9 % 100 mL IVPB     1 g 200 mL/hr over 30 Minutes Intravenous Every 8 hours 09/19/16 0738     09/11/16 1300  metroNIDAZOLE (FLAGYL) IVPB 500 mg  Status:  Discontinued     500 mg 100 mL/hr over 60 Minutes Intravenous Every 8 hours 09/11/16 1208 09/16/16 0912   09/11/16 1300  meropenem (MERREM) 1 g in sodium chloride 0.9 % 100 mL IVPB  Status:  Discontinued     1 g 200 mL/hr over 30 Minutes Intravenous Every 8 hours 09/11/16 1259 09/16/16 0912       Assessment/Plan Sepsis ARDS - extubated, management per CCM Severe protein calorie malnutrition  Seizure  SBO with perforation S/P Ex lap, 09/07/16, [facility] -  Dr. Brantley Stage - continue midline wound vac, changes MWF  Diarrhea - c diff negative - imodium   Tachycardia and Tachypnea  - CT angio neg for PE - febrile, Tmax 100.6  Abdominal fluid collections - CT 6/25 showed 2 fluid collections along lateral abdomen, loculated ascites or abscess collections, 2 fluid collections in the pelvis one in LLQ seen on preoperative CT one in RLQ is new and these may be adnexal cystic structures - will consult IR for possible drainage of new lateral fluid collections - Hold TF, NGT to LIWS, would like to not have to start TNA - GYN consult and transvaginal US to better assess pelvic lesions  Plan: IR for fluid collections, GYN for pelvic fluid collections, TNA   LOS: 11 days    Kalman Drape , [facility] Surgery 09/21/2016, 8:19 AM Pager: [phone] Consults: [phone] Mon-Fri 7:00 am-4:30 pm Sat-Sun 7:00 am-11:30 am

## 2016-09-21 NOTE — Consult Note (Signed)
Chief Complaint: Patient was seen in consultation today for pelvic collections aspirations at the request of Dr Clide Dales  Referring Physician(s): Dr Titus Mould  Supervising Physician: Daryll Brod  Patient Status: [facility] - In-pt  History of Present Illness: Brandi Leonard is a 51 y.o. female   Hx small bowel obstruction Emergent laparotomy 09/07/16 for perforation/peritonitis Transferred from [facility] for septic shock and respiratory failure Leukocytosis abd pain CT 6/25: IMPRESSION: Colon is diffusely dilated with fluid despite having a rectal tube. No significant small bowel dilatation.  There are two fluid collections in the pelvis which were likely present prior to the recent surgery. The largest is in the left lower quadrant measuring over 7 cm and there is a smaller complex collection in the right anterior pelvic region measuring 4.4 cm. These could be adnexal cystic structures but indeterminate. Ideally these areas could be further characterized with a pelvic ultrasound but may be difficult with the fluid-filled colon. There are two new postoperative fluid collections along the lateral abdomen. These could represent pockets of loculated ascites or abscess collections.  Request for aspiration/ drain placement Dr Annamaria Boots has reviewed imaging and feels Left sided collection has no safe window for drain. Could aspirate from smaller areas---now scheduled for same.   History reviewed. No pertinent past medical history.  History reviewed. No pertinent surgical history.  Allergies: Penicillins  Medications: Prior to Admission medications   Medication Sig Start Date End Date Taking? Authorizing Provider  amphetamine-dextroamphetamine (ADDERALL) 20 MG tablet Take 20 mg by mouth 4 (four) times daily. 08/12/16  Yes [provider]  clonazePAM (KLONOPIN) 2 MG tablet Take 2 mg by mouth 3 (three) times daily. 08/12/16  Yes [provider]    ibuprofen (ADVIL,MOTRIN) 800 MG tablet Take 800 mg by mouth 3 (three) times daily as needed for cramping. 06/15/16  Yes [provider]  montelukast (SINGULAIR) 10 MG tablet Take 10 mg by mouth daily. 06/10/16  Yes [provider]  OLANZapine-FLUoxetine (SYMBYAX) 6-25 MG capsule Take 1 capsule by mouth at bedtime. 08/02/16  Yes [provider]  oxyCODONE-acetaminophen (PERCOCET) 10-325 MG tablet Take 1 tablet by mouth every 6 (six) hours as needed for pain. 08/12/16  Yes [provider]     History reviewed. No pertinent family history.  Social History   Social History  . Marital status: Divorced    Spouse name: N/A  . Number of children: N/A  . Years of education: N/A   Social History Main Topics  . Smoking status: Current Every Day Smoker    Packs/day: 1.00    Years: 33.00    Types: Cigarettes    Start date: 09/18/1985  . Smokeless tobacco: Never Used  . Alcohol use None  . Drug use: Unknown  . Sexual activity: Not Asked   Other Topics Concern  . None   Social History Narrative  . None    Review of Systems: A 12 point ROS discussed and pertinent positives are indicated in the HPI above.  All other systems are negative.  Review of Systems  Constitutional: Positive for activity change, appetite change and fatigue. Negative for fever.  Respiratory: Positive for shortness of breath.   Gastrointestinal: Positive for abdominal distention, abdominal pain and nausea.  Neurological: Positive for weakness.  Psychiatric/Behavioral: Negative for behavioral problems and confusion.    Vital Signs: BP 113/73 (BP Location: Left Arm)   Pulse (!) 116   Temp 99.3 F (37.4 C) (Oral)   Resp 20  Ht 5\' 5"  (1.651 m)   Wt 107 lb 9.4 oz (48.8 kg)   LMP 09/16/2016   SpO2 96%   BMI 17.90 kg/m   Physical Exam  Cardiovascular: Normal rate.   Abdominal: She exhibits distension. There is tenderness.  Neurological: She is alert.  Skin: Skin is warm.   Psychiatric: She has a normal mood and affect.  Consented with mother at bedside  Nursing note and vitals reviewed.   Mallampati Score:  MD Evaluation Airway: WNL Heart: WNL Abdomen: WNL Chest/ Lungs: WNL ASA  Classification: 3 Mallampati/Airway Score: Two  Imaging: Ct Angio Chest Pe W Or Wo Contrast  Result Date: 09/19/2016 CLINICAL DATA:  Acute respiratory distress.  Elevated D-dimer. EXAM: CT ANGIOGRAPHY CHEST WITH CONTRAST TECHNIQUE: Multidetector CT imaging of the chest was performed using the standard protocol during bolus administration of intravenous contrast. Multiplanar CT image reconstructions and MIPs were obtained to evaluate the vascular anatomy. CONTRAST:  60 cc Isovue 370 IV COMPARISON:  Multiple prior radiographs most recently earlier this day. FINDINGS: Cardiovascular: There are no filling defects within the pulmonary arteries to suggest pulmonary embolus. Lower lobe evaluation is obscured by cardiac motion artifact. The thoracic aorta is normal in caliber without dissection. Coronary artery calcifications are seen. Fluid adjacent to the left ventricle is likely pericardial effusion Mediastinum/Nodes: No mediastinal or hilar adenopathy. No axillary adenopathy. Enteric tube in place, tip coursing be on the field of view. The right internal jugular central line tip is obscured by dense contrast. Possible pericardial effusion versus tracking left pleural effusion adjacent to the left heart, cardiac motion obscures detailed evaluation. Lungs/Pleura: Dependent left lower lobe opacity with air bronchograms, small left pleural effusion. Right middle lobe opacity with air bronchograms and volume loss. Minimal dependent right lower lobe atelectasis. Trace right pleural effusion. No evidence of pulmonary edema. Mild emphysema. Minimal debris in the dependent trachea may be mucus or aspiration. Upper Abdomen: Cholecystectomy clips.  Fluid in the stomach. Musculoskeletal: There are no acute  or suspicious osseous abnormalities. Review of the MIP images confirms the above findings. IMPRESSION: 1. No pulmonary embolus. 2. Small bilateral pleural effusions, left greater than right. 3. Consolidations with air bronchograms in the dependent left lower lobe, right middle lobe with volume loss and dependent right lower lobe, favored to be atelectasis; pneumonia or aspiration not excluded. 4. Minimal debris in the dependent trachea, may be mucus or aspiration. 5. Coronary artery calcifications.  Emphysema. Emphysema (ICD10-J43.9). Electronically Signed   By: Jeb Levering M.D.   On: 09/19/2016 06:01   Ct Abdomen Pelvis W Contrast  Result Date: 09/20/2016 CLINICAL DATA:  Sepsis and abdominal pain. History of bowel obstruction and underwent emergent laparotomy on 06/12 for perforation with peritonitis. Complex medical history with history of bladder repair and small bowel obstructions. EXAM: CT ABDOMEN AND PELVIS WITH CONTRAST TECHNIQUE: Multidetector CT imaging of the abdomen and pelvis was performed using the standard protocol following bolus administration of intravenous contrast. CONTRAST:  80 mL Isovue 300 COMPARISON:  CT 09/06/2016 FINDINGS: Lower chest: Trace left pleural fluid. Volume loss at both lung bases with some consolidation in the left lower lobe. Hepatobiliary: Gallbladder has been removed. Normal appearance of the liver. Portal venous system is patent. Pancreas: Normal appearance of the pancreas without inflammation or duct dilatation. Spleen: Normal appearance of spleen without enlargement. Adrenals/Urinary Tract: Normal adrenal glands. Urinary bladder is decompressed with a catheter. Normal appearance both kidneys without hydronephrosis. Stomach/Bowel: Orogastric tube extends into the stomach and terminates near the proximal  duodenum. Stomach is not distended. There is oral contrast within the small bowel and no significant small bowel dilatation. The colon is diffusely distended, mostly  with fluid. Rectum is distended with a large amount of fluid and there is a rectal catheter with a balloon. Surgical bowel anastomotic clips in the right lower quadrant probably involving the distal ileum. Vascular/Lymphatic: Atherosclerotic calcifications in the abdominal aorta without aneurysm. No significant lymph node enlargement in the abdomen or pelvis. Reproductive: Uterus is present with heterogeneous enhancement. There is a possible left adnexal structure measuring up to 7.7 cm. This structure is slightly dense and this was present on the preoperative study. Findings are concerning for a large complex left adnexal or ovarian cyst. There is also a complex low-density structure in the right lower quadrant which may have been present on the preoperative study as well. This area measures roughly 4.4 x 3.7 cm on sequence 3, images 79. Inflammatory changes in this area probably related to recent surgery. Other: Inflammatory changes in the right lower quadrant related to recent surgery. There appears to be an extraluminal fluid collection in the left lateral abdomen measuring 4.2 x 2.2 x 3.5 cm on sequence 2, image 43. This could represent a small abscess collection. Similarly, there is a crescent-shaped fluid collection along the posterior abdomen just inferior to the liver. This structure measures 5.1 x 1.8 x 5.9 cm. This could also represent loculated ascites or abscess collection. Musculoskeletal: No acute bone abnormality. IMPRESSION: Colon is diffusely dilated with fluid despite having a rectal tube. No significant small bowel dilatation. There are two fluid collections in the pelvis which were likely present prior to the recent surgery. The largest is in the left lower quadrant measuring over 7 cm and there is a smaller complex collection in the right anterior pelvic region measuring 4.4 cm. These could be adnexal cystic structures but indeterminate. Ideally these areas could be further characterized with a  pelvic ultrasound but may be difficult with the fluid-filled colon. There are two new postoperative fluid collections along the lateral abdomen. These could represent pockets of loculated ascites or abscess collections. Trace left pleural fluid with consolidation or volume loss in the left lower lobe. Volume loss in the right lower lobe. Electronically Signed   By: Markus Daft M.D.   On: 09/20/2016 16:53   Dg Chest Portable 1 View  Result Date: 09/21/2016 CLINICAL DATA:  51 year old female with pneumonia and shortness breath. Subsequent encounter. EXAM: PORTABLE CHEST 1 VIEW COMPARISON:  09/20/2016. FINDINGS: Right central line tip proximal superior vena cava level. Nasogastric tube courses below the diaphragm. Tip is not included on the present exam. Progressive consolidation retrocardiac region may represent progressive infiltrate, atelectasis and/or pleural effusion. Subsegmental atelectasis right lung base. Heart size top-normal. IMPRESSION: Progressive consolidation retrocardiac region may represent progressive infiltrate, atelectasis and/or pleural effusion. Subsegmental atelectasis right lung base. Electronically Signed   By: Genia Del M.D.   On: 09/21/2016 07:02   Dg Chest Port 1 View  Result Date: 09/20/2016 CLINICAL DATA:  Aspiration.  Follow-up. EXAM: PORTABLE CHEST 1 VIEW COMPARISON:  09/19/2016. FINDINGS: Nasogastric tube and right internal jugular central line are unchanged. There is slightly better aeration in both lower lobes. Upper lungs remain clear. IMPRESSION: Mild radiographic improvement in the lower lobe aeration. Electronically Signed   By: Nelson Chimes M.D.   On: 09/20/2016 07:23   Dg Chest Port 1 View  Result Date: 09/19/2016 CLINICAL DATA:  Tachypnea. EXAM: PORTABLE CHEST 1 VIEW COMPARISON:  Chest  CT 09/19/2016 at 5:34 a.m. FINDINGS: Cardiac silhouette is normal in size. No mediastinal or hilar masses. Right internal jugular central venous line tip projects in the upper  superior vena cava. Nasal/orogastric tube passes well below the diaphragm into the stomach and below the included field of view. There is persistent lung base opacity consistent with atelectasis as noted on the current chest CT. No evidence of pulmonary edema. No pneumothorax. Skeletal structures are unremarkable. IMPRESSION: 1. Right internal jugular central venous line tip projects in the upper superior vena cava. No pneumothorax. 2. Well-positioned nasogastric tube. 3. Persistent lung base opacity consistent with atelectasis, stable from the current CT. No new lung abnormalities. Electronically Signed   By: Lajean Manes M.D.   On: 09/19/2016 17:32   Dg Chest Port 1 View  Result Date: 09/19/2016 CLINICAL DATA:  Tachypnea and hypoxia. EXAM: PORTABLE CHEST 1 VIEW COMPARISON:  Chest radiograph September 16, 2016 FINDINGS: Cardiomediastinal silhouette is normal. Persistent mildly elevated RIGHT hemidiaphragm with bibasilar strandy densities. Trace LEFT pleural effusion. No pneumothorax. RIGHT internal jugular central venous catheter distal tip projects in proximal superior vena cava. Nasogastric tube past proximal stomach, distal tip out of field-of-view. Surgical clips in the included right abdomen compatible with cholecystectomy. Soft tissue planes and included osseous structures are unchanged. IMPRESSION: Bibasilar atelectasis.  Trace LEFT pleural effusion. No apparent change in life-support lines. Electronically Signed   By: Elon Alas M.D.   On: 09/19/2016 02:26   Dg Chest Portable 1 View  Result Date: 09/16/2016 CLINICAL DATA:  Initial evaluation for acute respiratory failure, shortness of breath. EXAM: PORTABLE CHEST 1 VIEW COMPARISON:  Prior radiograph from 09/13/2016. FINDINGS: Endotracheal tube in place with tip positioned 3.1 cm above the carina. Enteric tube overlies the stomach. Right IJ approach centra venous catheter in place with tip overlying the mid SVC, stable. Cardiac and mediastinal  silhouettes unchanged. Lungs are hypoinflated. Persistent bibasilar opacities, which may reflect atelectasis and/ or infiltrates. Probable small left pleural effusion. Previously seen pulmonary edema is improved. No pneumothorax. Osseous structures unchanged. IMPRESSION: 1. Support apparatus in satisfactory position as above. 2. Shallow lung inflation with persistent bibasilar opacities, which may reflect atelectasis and/or infiltrates. 3. Persistent small left pleural effusion. 4. Improved pulmonary vascular congestion/edema as compared to previous. Electronically Signed   By: Jeannine Boga M.D.   On: 09/16/2016 06:41   Dg Chest Port 1 View  Result Date: 09/13/2016 CLINICAL DATA:  Intubation. EXAM: PORTABLE CHEST 1 VIEW COMPARISON:  09/12/2016. FINDINGS: Endotracheal tube, NG tube, right IJ line stable position. Heart size normal. Persistent bilateral interstitial infiltrates/edema, left side greater than right. No change from prior exam. Persistent basilar atelectasis. Small left pleural effusion cannot be excluded. No pneumothorax . IMPRESSION: 1. Lines and tubes in stable position. 2. Persistent bilateral pulmonary interstitial infiltrates/edema, left side greater than right. No change from prior exam . Persistent bibasilar atelectasis. Small left pleural effusion cannot be excluded . Electronically Signed   By: Marcello Moores  Register   On: 09/13/2016 07:39   Dg Chest Port 1 View  Result Date: 09/12/2016 CLINICAL DATA:  Respiratory failure. EXAM: PORTABLE CHEST 1 VIEW COMPARISON:  09/11/2016 FINDINGS: Endotracheal tube is in place with tip approximately 3 cm above the carina. Nasogastric tube is in place with tip off the film beyond the gastroesophageal junction. Right IJ central line tip overlies the level of superior vena cava. Heart size is normal. There is significant opacity at the left lung base obscuring the hemidiaphragm. Minimal right lower lobe atelectasis.  There is mild interstitial pulmonary  edema. The appearance is stable. IMPRESSION: Persistent significant left lower lobe opacity and minimal right lower lobe atelectasis. Interstitial pulmonary edema. Electronically Signed   By: Nolon Nations M.D.   On: 09/12/2016 08:02   Dg Chest Port 1 View  Result Date: 09/11/2016 CLINICAL DATA:  Lung infiltrates EXAM: PORTABLE CHEST 1 VIEW COMPARISON:  09/10/2016 FINDINGS: Endotracheal tube in satisfactory position. Right jugular central venous catheter tip in the proximal SVC unchanged Diffuse bilateral airspace disease unchanged. This is most prominent in the left lower lobe. Probable small pleural effusions. IMPRESSION: Support lines unchanged in satisfactory position Diffuse bilateral airspace disease with basilar predominance is stable. Electronically Signed   By: Franchot Gallo M.D.   On: 09/11/2016 07:27   Dg Chest Port 1 View  Result Date: 09/10/2016 CLINICAL DATA:  Central line placement EXAM: PORTABLE CHEST 1 VIEW COMPARISON:  09/10/2016 FINDINGS: Right IJ central line catheter is noted without associated pneumothorax. The tip projects over the expected location of the proximal SVC. An endotracheal tube tip is seen 5.5 cm above the carina. A gastric tube extends below the left hemidiaphragm though the tip is excluded on this study. Heart is borderline enlarged with diffuse scattered ground-glass opacities which may reflect sequela of mild pulmonary edema. Retrocardiac opacity and consolidation persists with small left effusion. IMPRESSION: 1. Satisfactory appearing support line and tube positions. 2. Diffuse ground-glass opacities which may reflect sequela pulmonary edema or atypical pneumonia. Persistent retrocardiac consolidation and probable small left pleural effusion. Electronically Signed   By: Ashley Royalty M.D.   On: 09/10/2016 15:18   Dg Abd Portable 1v  Result Date: 09/20/2016 CLINICAL DATA:  Abdominal distension EXAM: PORTABLE ABDOMEN - 1 VIEW COMPARISON:  Portable exam 0931 hours  compared to 09/16/2016 FINDINGS: Tip of nasogastric tube projects over pyloric region. Surgical clips RIGHT upper quadrant likely cholecystectomy. Gas in stomach and throughout a normal caliber colon. No small bowel distention. Anastomotic staple line RIGHT mid abdomen. No definite bowel wall thickening. Degenerative changes lower lumbar spine. No urinary tract calcification. IMPRESSION: Gas throughout nondistended colon and within stomach. Electronically Signed   By: Lavonia Dana M.D.   On: 09/20/2016 09:48   Dg Abd Portable 1v  Result Date: 09/16/2016 CLINICAL DATA:  Encounter for nasogastric tube placement EXAM: PORTABLE ABDOMEN - 1 VIEW COMPARISON:  09/07/2016 FINDINGS: Nasogastric tube is in place, tip overlying the level of the distal stomach. Visualized bowel gas pattern is nonobstructive. There is dense opacification at the left lung base. IMPRESSION: 1. Nasogastric tube to the distal stomach. 2. Left lower lobe atelectasis or infiltrate. Electronically Signed   By: Nolon Nations M.D.   On: 09/16/2016 20:41    Labs:  CBC:  Recent Labs  09/18/16 0430 09/19/16 0219 09/20/16 0329 09/21/16 0250  WBC 21.7* 25.1* 22.2* 24.3*  HGB 9.5* 10.0* 9.9* 8.9*  HCT 30.3* 32.0* 32.2* 28.9*  PLT 762* 996* 1,021* 790*    COAGS:  Recent Labs  09/10/16 1344  INR 1.65    BMP:  Recent Labs  09/20/16 0329 09/20/16 0914 09/20/16 2039 09/21/16 0250  NA 151* 157* 152* 149*  K 4.0 4.2 3.0* 2.5*  CL 129* >130* 123* 114*  CO2 14* 19* 22 28  GLUCOSE 254* 215* 109* 152*  BUN 36* 36* 23* 22*  CALCIUM 8.3* 8.6* 8.2* 7.8*  CREATININE 0.65 0.59 0.46 0.45  GFRNONAA >60 >60 >60 >60  GFRAA >60 >60 >60 >60    LIVER FUNCTION TESTS:  Recent Labs  09/13/16 0322  09/16/16 0350  09/18/16 0430 09/19/16 0245 09/20/16 0329 09/21/16 0250  BILITOT 0.4  --  0.2*  --   --   --  0.6 0.3  AST 18  --  17  --   --   --  26 57*  ALT 22  --  19  --   --   --  53 84*  ALKPHOS 57  --  59  --   --   --   348* 252*  PROT 3.7*  --  4.4*  --   --   --  6.6 4.9*  ALBUMIN 1.3*  < > 1.4*  < > 1.7* 2.0* 2.1* 1.8*  < > = values in this interval not displayed.  TUMOR MARKERS: No results for input(s): AFPTM, CEA, CA199, CHROMGRNA in the last 8760 hours.  Assessment and Plan:  Small bowel obstruction Post 6/12 laparotomy - perforation/peritonitis Now with post op pelvic fluid collections  -- intra abdominal abscesses Scheduled for aspiration of one or more for evaluation Risks and Benefits discussed with the patient including, but not limited to bleeding, infection, damage to adjacent structures or low yield requiring additional tests. All of the patient's questions were answered, patient is agreeable to proceed. Consent signed and in chart.  Thank you for this interesting consult.  I greatly enjoyed meeting [place] and look forward to participating in their care.  A copy of this report was sent to the requesting provider on this date.  Electronically Signed: Lavonia Drafts, PA-C 09/21/2016, 10:56 AM   I spent a total of 40 Minutes    in face to face in clinical consultation, greater than 50% of which was counseling/coordinating care for pelvic fluid collection aspiration

## 2016-09-21 NOTE — Progress Notes (Signed)
Pharmacy Antibiotic Note  Brandi Leonard is a 51 y.o. female admitted on 09/10/2016 with SBO, ARDS, septic shock.  Pharmacy has been consulted for Vancomycin dosing. WBC count remains elevated, Scr appears stable.   vt - 6  Plan: Increase vanc to 1 g q8  Height: 5\' 5"  (165.1 cm) Weight: 107 lb 9.4 oz (48.8 kg) IBW/kg (Calculated) : 57  Temp (24hrs), Avg:99 F (37.2 C), Min:98.5 F (36.9 C), Max:99.5 F (37.5 C)   Recent Labs Lab 09/18/16 0430 09/19/16 0219  09/19/16 1608 09/20/16 0329 09/20/16 0914 09/20/16 2039 09/21/16 0250 09/21/16 1125 09/21/16 1853 09/21/16 2120 09/21/16 2135  WBC 21.7* 25.1*  --   --  22.2*  --   --  24.3*  --   --   --  14.9*  CREATININE 0.42*  --   < >  --  0.65 0.59 0.46 0.45 0.48 0.45  --   --   LATICACIDVEN  --   --   --  1.2  --  1.2  --   --   --   --   --   --   VANCOTROUGH  --   --   --   --   --   --   --   --   --   --  6*  --   < > = values in this interval not displayed.  Estimated Creatinine Clearance: 66.3 mL/min (by C-G formula based on SCr of 0.45 mg/dL).    Allergies  Allergen Reactions  . Penicillins Rash    Has patient had a PCN reaction causing immediate rash, facial/tongue/throat swelling, SOB or lightheadedness with hypotension: Unknown Has patient had a PCN reaction causing severe rash involving mucus membranes or skin necrosis: Unknown Has patient had a PCN reaction that required hospitalization: No Has patient had a PCN reaction occurring within the last 10 years: No If all of the above answers are "NO", then may proceed with Cephalosporin use.    Levester Fresh, PharmD, BCPS, BCCCP Clinical Pharmacist 09/21/2016 10:10 PM

## 2016-09-21 NOTE — Procedures (Signed)
Post op abscess  S/p CT aspiration of bilateral small ABDOMINAL abscesses  No comp cx sent  11cc pus aspirated on the left  3cc sersang fld aspirated on the right  Full report in PACS

## 2016-09-21 NOTE — Progress Notes (Signed)
Patient ID: Brandi Leonard, female   DOB: [birth year], 51 y.o.   MRN: [id number]   Request for intra abdominal

## 2016-09-21 NOTE — Sedation Documentation (Signed)
Patient is resting comfortably. 

## 2016-09-21 NOTE — Progress Notes (Signed)
CRITICAL VALUE ALERT  Critical Value:  K 2.5  Date & Time Notied:  6.26.18 0349  Provider Notified: Warren Lacy.  Yes  Orders Received/Actions taken: Awaiting response

## 2016-09-22 LAB — BASIC METABOLIC PANEL
Anion gap: 6 (ref 5–15)
BUN: 14 mg/dL (ref 6–20)
CALCIUM: 7.9 mg/dL — AB (ref 8.9–10.3)
CO2: 29 mmol/L (ref 22–32)
CREATININE: 0.34 mg/dL — AB (ref 0.44–1.00)
Chloride: 109 mmol/L (ref 101–111)
GFR calc Af Amer: >60 mL/min (ref >60)
GLUCOSE: 108 mg/dL — AB (ref 65–99)
Potassium: 3.2 mmol/L — ABNORMAL LOW (ref 3.5–5.1)
Sodium: 144 mmol/L (ref 135–145)

## 2016-09-22 LAB — CBC WITH DIFFERENTIAL/PLATELET
BASOS PCT: 0 %
Basophils Absolute: 0 10*3/uL (ref 0.0–0.1)
EOS ABS: 0.1 10*3/uL (ref 0.0–0.7)
EOS PCT: 0 %
HCT: 26 % — ABNORMAL LOW (ref 36.0–46.0)
Hemoglobin: 7.7 g/dL — ABNORMAL LOW (ref 12.0–15.0)
LYMPHS ABS: 1.5 10*3/uL (ref 0.7–4.0)
Lymphocytes Relative: 10 %
MCH: 24.6 pg — AB (ref 26.0–34.0)
MCHC: 29.6 g/dL — AB (ref 30.0–36.0)
MCV: 83.1 fL (ref 78.0–100.0)
Monocytes Absolute: 0.6 10*3/uL (ref 0.1–1.0)
Monocytes Relative: 4 %
NEUTROS PCT: 86 %
Neutro Abs: 12.4 10*3/uL — ABNORMAL HIGH (ref 1.7–7.7)
PLATELETS: 659 10*3/uL — AB (ref 150–400)
RBC: 3.13 MIL/uL — AB (ref 3.87–5.11)
RDW: 18.1 % — ABNORMAL HIGH (ref 11.5–15.5)
WBC: 14.7 10*3/uL — AB (ref 4.0–10.5)

## 2016-09-22 LAB — COMPREHENSIVE METABOLIC PANEL
ALK PHOS: 175 U/L — AB (ref 38–126)
ALT: 71 U/L — ABNORMAL HIGH (ref 14–54)
ANION GAP: 7 (ref 5–15)
AST: 28 U/L (ref 15–41)
Albumin: 1.6 g/dL — ABNORMAL LOW (ref 3.5–5.0)
BILIRUBIN TOTAL: 0.1 mg/dL — AB (ref 0.3–1.2)
BUN: 15 mg/dL (ref 6–20)
CALCIUM: 7.9 mg/dL — AB (ref 8.9–10.3)
CO2: 30 mmol/L (ref 22–32)
Chloride: 111 mmol/L (ref 101–111)
Creatinine, Ser: 0.45 mg/dL (ref 0.44–1.00)
GLUCOSE: 103 mg/dL — AB (ref 65–99)
POTASSIUM: 3.8 mmol/L (ref 3.5–5.1)
Sodium: 148 mmol/L — ABNORMAL HIGH (ref 135–145)
TOTAL PROTEIN: 4.7 g/dL — AB (ref 6.5–8.1)

## 2016-09-22 LAB — GLUCOSE, CAPILLARY
GLUCOSE-CAPILLARY: 105 mg/dL — AB (ref 65–99)
GLUCOSE-CAPILLARY: 110 mg/dL — AB (ref 65–99)
GLUCOSE-CAPILLARY: 91 mg/dL (ref 65–99)
Glucose-Capillary: 154 mg/dL — ABNORMAL HIGH (ref 65–99)
Glucose-Capillary: 142 mg/dL — ABNORMAL HIGH (ref 65–99)
Glucose-Capillary: 107 mg/dL — ABNORMAL HIGH (ref 65–99)

## 2016-09-22 LAB — MAGNESIUM: MAGNESIUM: 2.1 mg/dL (ref 1.7–2.4)

## 2016-09-22 LAB — PHOSPHORUS: PHOSPHORUS: 3.9 mg/dL (ref 2.5–4.6)

## 2016-09-22 MED ORDER — DEXTROSE 5 % IV SOLN
INTRAVENOUS | Status: DC
  Administered 2016-09-22: 12:00:00 via INTRAVENOUS
  Administered 2016-09-23: 75 mL via INTRAVENOUS
  Administered 2016-09-23 – 2016-09-24 (×2): via INTRAVENOUS
  Administered 2016-09-26: 75 mL via INTRAVENOUS

## 2016-09-22 MED ORDER — SODIUM CHLORIDE 0.45 % IV SOLN
INTRAVENOUS | Status: DC
  Administered 2016-09-22: 09:00:00 via INTRAVENOUS

## 2016-09-22 NOTE — Care Management Note (Signed)
Case Management Note Marvetta Gibbons RN, BSN Unit 2W-Case Manager--2H coverage [phone]  Patient Details  Name: Brandi Leonard MRN: [id number] Date of Birth: 1965-07-26  Subjective/Objective:  Pt admitted to [facility] with Small bowel obstruction, underwent emergent laparotomy on 6/12 for perforation with peritonitis.  6/15- tx to [facility] with ARDS and septic shock- on vent-  TPN, Norepi,                  Action/Plan: PTA pt lived at home- CM to follow for d/c needs  Expected Discharge Date:                  Expected Discharge Plan:     In-House Referral:     Discharge planning Services  CM Consult  Post Acute Care Choice:    Choice offered to:     DME Arranged:    DME Agency:     HH Arranged:    HH Agency:     Status of Service:  In process, will continue to follow  If discussed at Long Length of Stay Meetings, dates discussed:    Discharge Disposition:   Additional Comments: 09/22/2016  Pt is now extubated on tube feeds, wound vac, NG tube to suction and IV antibiotics.  Pt may start a diet tomorrow.  PT ordered  09/16/16 Pt transferred to ICU after failingvextubation postoperatively and then developed diffuse bilateral infiltrates consistent with ARDS and septic shock requiring Levophed Brandi Labrador, RN 09/22/2016, 3:57 PM

## 2016-09-22 NOTE — Progress Notes (Signed)
Subjective/Chief Complaint: Feels much better, having some flatus, underwent drainage of two abdominal fluid collections yesterday   Objective: Vital signs in last 24 hours: Temp:  [98.3 F (36.8 C)-99.5 F (37.5 C)] 98.9 F (37.2 C) (06/27 0400) Pulse Rate:  [93-115] 95 (06/27 0700) Resp:  [17-27] 20 (06/27 0700) BP: (75-125)/(53-106) 111/69 (06/27 0700) SpO2:  [89 %-100 %] 100 % (06/27 0700) Weight:  [52.7 kg (116 lb 2.9 oz)] 52.7 kg (116 lb 2.9 oz) (06/27 0500) Last BM Date: 09/21/16  Intake/Output from previous day: 06/26 0701 - 06/27 0700 In: 4010.8 [I.V.:3170.8; NG/GT:60; IV Piggyback:780] Out: 2900 [Urine:1080; Emesis/NG output:1350; Drains:20; Stool:450] Intake/Output this shift: Total I/O In: 300 [IV Piggyback:300] Out: -   GI: vac in place, bs present, less distended, much less tender, ng with bilious output  Lab Results:   Recent Labs  09/21/16 2135 09/22/16 0530  WBC 14.9* 14.7*  HGB 7.9* 7.7*  HCT 26.5* 26.0*  PLT 633* 659*   BMET  Recent Labs  09/21/16 2135 09/22/16 0530  NA 147* 148*  K 3.9 3.8  CL 113* 111  CO2 29 30  GLUCOSE 115* 103*  BUN 17 15  CREATININE 0.42* 0.45  CALCIUM 7.6* 7.9*   PT/INR No results for input(s): LABPROT, INR in the last 72 hours. ABG  Recent Labs  09/19/16 1704 09/20/16 0640  PHART 7.384 7.377  HCO3 12.1* 11.7*    Studies/Results: US Transvaginal Non-ob  Result Date: 09/21/2016 CLINICAL DATA:  51 year old with multiple intra-abdominal abscesses and free pelvic fluid on recent CT. EXAM: TRANSABDOMINAL AND TRANSVAGINAL ULTRASOUND OF PELVIS DOPPLER ULTRASOUND OF OVARIES TECHNIQUE: Both transabdominal and transvaginal ultrasound examinations of the pelvis were performed. Transabdominal technique was performed for global imaging of the pelvis including uterus, ovaries, adnexal regions, and pelvic cul-de-sac. It was necessary to proceed with endovaginal exam following the transabdominal exam to visualize the  adnexa. Color and duplex Doppler ultrasound was utilized to evaluate blood flow to the ovaries. COMPARISON:  CT abdomen and pelvis 09/20/2016, 09/06/2016. FINDINGS: Uterus Measurements: Approximately 7.5 x 6.0 x 6.2 cm. Likely submucosal fibroid measuring approximately 2.6 x 3.4 x 2.8 cm as the noted on CT. Endometrium Not visualized either transabdominally or transvaginally. Right ovary Measurements: Approximately 6.8 x 6.3 x 7.7 cm. Large simple cyst measuring approximately 6.7 x 5.6 x 7.2 cm. Normal color Doppler signal in the ovarian tissue. Left ovary Not visualized either transabdominally or transvaginally. Pulsed Doppler evaluation of the right ovary demonstrates normal low-resistance arterial and venous waveforms. Other findings Loculated fluid in the right adnexum. Loculated fluid versus hydrosalpinx involving the left adnexum. Semi-solid stool within the dilated rectum. IMPRESSION: 1. Approximate 7 cm cyst arising from the right ovary. 2. Approximate 3.4 cm uterine fibroid, likely submucosal in location. 3. Nonvisualization of the endometrium and left ovary. 4. Loculated fluid in both adnexa. The loculated fluid in the right adnexum has the appearance of an abscess. A loculated fluid in the left adnexum could be loculated fluid or a hydrosalpinx. Given the large right ovarian cyst and the inability to see the endometrium and left ovary on today's examination, a follow-up pelvic ultrasound is recommended in 6 or 10 weeks, after the patient has recovered from her acute condition. Electronically Signed   By: Evangeline Dakin M.D.   On: 09/21/2016 17:08   US Pelvis Complete  Result Date: 09/21/2016 CLINICAL DATA:  51 year old with multiple intra-abdominal abscesses and free pelvic fluid on recent CT. EXAM: TRANSABDOMINAL AND TRANSVAGINAL ULTRASOUND OF PELVIS DOPPLER ULTRASOUND  OF OVARIES TECHNIQUE: Both transabdominal and transvaginal ultrasound examinations of the pelvis were performed. Transabdominal  technique was performed for global imaging of the pelvis including uterus, ovaries, adnexal regions, and pelvic cul-de-sac. It was necessary to proceed with endovaginal exam following the transabdominal exam to visualize the adnexa. Color and duplex Doppler ultrasound was utilized to evaluate blood flow to the ovaries. COMPARISON:  CT abdomen and pelvis 09/20/2016, 09/06/2016. FINDINGS: Uterus Measurements: Approximately 7.5 x 6.0 x 6.2 cm. Likely submucosal fibroid measuring approximately 2.6 x 3.4 x 2.8 cm as the noted on CT. Endometrium Not visualized either transabdominally or transvaginally. Right ovary Measurements: Approximately 6.8 x 6.3 x 7.7 cm. Large simple cyst measuring approximately 6.7 x 5.6 x 7.2 cm. Normal color Doppler signal in the ovarian tissue. Left ovary Not visualized either transabdominally or transvaginally. Pulsed Doppler evaluation of the right ovary demonstrates normal low-resistance arterial and venous waveforms. Other findings Loculated fluid in the right adnexum. Loculated fluid versus hydrosalpinx involving the left adnexum. Semi-solid stool within the dilated rectum. IMPRESSION: 1. Approximate 7 cm cyst arising from the right ovary. 2. Approximate 3.4 cm uterine fibroid, likely submucosal in location. 3. Nonvisualization of the endometrium and left ovary. 4. Loculated fluid in both adnexa. The loculated fluid in the right adnexum has the appearance of an abscess. A loculated fluid in the left adnexum could be loculated fluid or a hydrosalpinx. Given the large right ovarian cyst and the inability to see the endometrium and left ovary on today's examination, a follow-up pelvic ultrasound is recommended in 6 or 10 weeks, after the patient has recovered from her acute condition. Electronically Signed   By: Evangeline Dakin M.D.   On: 09/21/2016 17:08   Ct Abdomen Pelvis W Contrast  Result Date: 09/20/2016 CLINICAL DATA:  Sepsis and abdominal pain. History of bowel obstruction and  underwent emergent laparotomy on 06/12 for perforation with peritonitis. Complex medical history with history of bladder repair and small bowel obstructions. EXAM: CT ABDOMEN AND PELVIS WITH CONTRAST TECHNIQUE: Multidetector CT imaging of the abdomen and pelvis was performed using the standard protocol following bolus administration of intravenous contrast. CONTRAST:  80 mL Isovue 300 COMPARISON:  CT 09/06/2016 FINDINGS: Lower chest: Trace left pleural fluid. Volume loss at both lung bases with some consolidation in the left lower lobe. Hepatobiliary: Gallbladder has been removed. Normal appearance of the liver. Portal venous system is patent. Pancreas: Normal appearance of the pancreas without inflammation or duct dilatation. Spleen: Normal appearance of spleen without enlargement. Adrenals/Urinary Tract: Normal adrenal glands. Urinary bladder is decompressed with a catheter. Normal appearance both kidneys without hydronephrosis. Stomach/Bowel: Orogastric tube extends into the stomach and terminates near the proximal duodenum. Stomach is not distended. There is oral contrast within the small bowel and no significant small bowel dilatation. The colon is diffusely distended, mostly with fluid. Rectum is distended with a large amount of fluid and there is a rectal catheter with a balloon. Surgical bowel anastomotic clips in the right lower quadrant probably involving the distal ileum. Vascular/Lymphatic: Atherosclerotic calcifications in the abdominal aorta without aneurysm. No significant lymph node enlargement in the abdomen or pelvis. Reproductive: Uterus is present with heterogeneous enhancement. There is a possible left adnexal structure measuring up to 7.7 cm. This structure is slightly dense and this was present on the preoperative study. Findings are concerning for a large complex left adnexal or ovarian cyst. There is also a complex low-density structure in the right lower quadrant which may have been present  on  the preoperative study as well. This area measures roughly 4.4 x 3.7 cm on sequence 3, images 79. Inflammatory changes in this area probably related to recent surgery. Other: Inflammatory changes in the right lower quadrant related to recent surgery. There appears to be an extraluminal fluid collection in the left lateral abdomen measuring 4.2 x 2.2 x 3.5 cm on sequence 2, image 43. This could represent a small abscess collection. Similarly, there is a crescent-shaped fluid collection along the posterior abdomen just inferior to the liver. This structure measures 5.1 x 1.8 x 5.9 cm. This could also represent loculated ascites or abscess collection. Musculoskeletal: No acute bone abnormality. IMPRESSION: Colon is diffusely dilated with fluid despite having a rectal tube. No significant small bowel dilatation. There are two fluid collections in the pelvis which were likely present prior to the recent surgery. The largest is in the left lower quadrant measuring over 7 cm and there is a smaller complex collection in the right anterior pelvic region measuring 4.4 cm. These could be adnexal cystic structures but indeterminate. Ideally these areas could be further characterized with a pelvic ultrasound but may be difficult with the fluid-filled colon. There are two new postoperative fluid collections along the lateral abdomen. These could represent pockets of loculated ascites or abscess collections. Trace left pleural fluid with consolidation or volume loss in the left lower lobe. Volume loss in the right lower lobe. Electronically Signed   By: Markus Daft M.D.   On: 09/20/2016 16:53   Ct Aspiration  Result Date: 09/21/2016 INDICATION: Postop small abdominal abscesses EXAM: CT GUIDED ASPIRATIONS OF SMALL BILATERAL ABDOMINAL ABSCESSES MEDICATIONS: The patient is currently admitted to the hospital and receiving intravenous antibiotics. The antibiotics were administered within an appropriate time frame prior to the  initiation of the procedure. ANESTHESIA/SEDATION: 1.0 mg IV Versed 50 mcg IV Fentanyl Moderate Sedation Time:  15 MINUTES The patient was continuously monitored during the procedure by the interventional radiology nurse under my direct supervision. COMPLICATIONS: None immediate. TECHNIQUE: Informed written consent was obtained from the [place] after a thorough discussion of the procedural risks, benefits and alternatives. All questions were addressed. Maximal Sterile Barrier Technique was utilized including caps, mask, sterile gowns, sterile gloves, sterile drape, hand hygiene and skin antiseptic. A timeout was performed prior to the initiation of the procedure. PROCEDURE: Previous imaging reviewed. Patient positioned supine. Noncontrast localization CT performed. The small bilateral pericolic gutter fluid collections by CT were localized. Overlying skin was marked bilaterally. Left lateral abdominal abscess aspiration: Under sterile conditions and local anesthesia, an 18 gauge trocar needle was advanced from a lateral approach into the small left lateral pericolic gutter abscess. Syringe aspiration yielded 11 cc purulent fluid. This nearly collapsed the small abscess. Sample sent for culture. No immediate complication. Right lateral abdominal abscess aspiration: In a similar fashion, under sterile conditions and local anesthesia, a second 18 gauge needle was advanced from a lateral approach into the small right abdominal abscess. Syringe aspiration yielded only a few cc of thin serosanguineous fluid. Sample sent for culture. Needle removed. Patient tolerated the procedure well. No immediate complication. FINDINGS: CT imaging confirms percutaneous needle access of the bilateral pericolic gutter small fluid collections for needle aspiration IMPRESSION: Successful CT-guided bilateral aspirations of the small abdominal abscesses as above. Electronically Signed   By: Jerilynn Mages.  Shick M.D.   On: 09/21/2016 16:28   Ct  Aspiration  Result Date: 09/21/2016 INDICATION: Postop small abdominal abscesses EXAM: CT GUIDED ASPIRATIONS OF SMALL BILATERAL ABDOMINAL ABSCESSES  MEDICATIONS: The patient is currently admitted to the hospital and receiving intravenous antibiotics. The antibiotics were administered within an appropriate time frame prior to the initiation of the procedure. ANESTHESIA/SEDATION: 1.0 mg IV Versed 50 mcg IV Fentanyl Moderate Sedation Time:  15 MINUTES The patient was continuously monitored during the procedure by the interventional radiology nurse under my direct supervision. COMPLICATIONS: None immediate. TECHNIQUE: Informed written consent was obtained from the [place] after a thorough discussion of the procedural risks, benefits and alternatives. All questions were addressed. Maximal Sterile Barrier Technique was utilized including caps, mask, sterile gowns, sterile gloves, sterile drape, hand hygiene and skin antiseptic. A timeout was performed prior to the initiation of the procedure. PROCEDURE: Previous imaging reviewed. Patient positioned supine. Noncontrast localization CT performed. The small bilateral pericolic gutter fluid collections by CT were localized. Overlying skin was marked bilaterally. Left lateral abdominal abscess aspiration: Under sterile conditions and local anesthesia, an 18 gauge trocar needle was advanced from a lateral approach into the small left lateral pericolic gutter abscess. Syringe aspiration yielded 11 cc purulent fluid. This nearly collapsed the small abscess. Sample sent for culture. No immediate complication. Right lateral abdominal abscess aspiration: In a similar fashion, under sterile conditions and local anesthesia, a second 18 gauge needle was advanced from a lateral approach into the small right abdominal abscess. Syringe aspiration yielded only a few cc of thin serosanguineous fluid. Sample sent for culture. Needle removed. Patient tolerated the procedure well. No  immediate complication. FINDINGS: CT imaging confirms percutaneous needle access of the bilateral pericolic gutter small fluid collections for needle aspiration IMPRESSION: Successful CT-guided bilateral aspirations of the small abdominal abscesses as above. Electronically Signed   By: Jerilynn Mages.  Shick M.D.   On: 09/21/2016 16:28   [place] Art/ven Flow Abd Pelv Doppler  Result Date: 09/21/2016 CLINICAL DATA:  51 year old with multiple intra-abdominal abscesses and free pelvic fluid on recent CT. EXAM: TRANSABDOMINAL AND TRANSVAGINAL ULTRASOUND OF PELVIS DOPPLER ULTRASOUND OF OVARIES TECHNIQUE: Both transabdominal and transvaginal ultrasound examinations of the pelvis were performed. Transabdominal technique was performed for global imaging of the pelvis including uterus, ovaries, adnexal regions, and pelvic cul-de-sac. It was necessary to proceed with endovaginal exam following the transabdominal exam to visualize the adnexa. Color and duplex Doppler ultrasound was utilized to evaluate blood flow to the ovaries. COMPARISON:  CT abdomen and pelvis 09/20/2016, 09/06/2016. FINDINGS: Uterus Measurements: Approximately 7.5 x 6.0 x 6.2 cm. Likely submucosal fibroid measuring approximately 2.6 x 3.4 x 2.8 cm as the noted on CT. Endometrium Not visualized either transabdominally or transvaginally. Right ovary Measurements: Approximately 6.8 x 6.3 x 7.7 cm. Large simple cyst measuring approximately 6.7 x 5.6 x 7.2 cm. Normal color Doppler signal in the ovarian tissue. Left ovary Not visualized either transabdominally or transvaginally. Pulsed Doppler evaluation of the right ovary demonstrates normal low-resistance arterial and venous waveforms. Other findings Loculated fluid in the right adnexum. Loculated fluid versus hydrosalpinx involving the left adnexum. Semi-solid stool within the dilated rectum. IMPRESSION: 1. Approximate 7 cm cyst arising from the right ovary. 2. Approximate 3.4 cm uterine fibroid, likely submucosal in  location. 3. Nonvisualization of the endometrium and left ovary. 4. Loculated fluid in both adnexa. The loculated fluid in the right adnexum has the appearance of an abscess. A loculated fluid in the left adnexum could be loculated fluid or a hydrosalpinx. Given the large right ovarian cyst and the inability to see the endometrium and left ovary on today's examination, a follow-up pelvic ultrasound is recommended in  6 or 10 weeks, after the patient has recovered from her acute condition. Electronically Signed   By: Evangeline Dakin M.D.   On: 09/21/2016 17:08   Dg Chest Portable 1 View  Result Date: 09/21/2016 CLINICAL DATA:  51 year old female with pneumonia and shortness breath. Subsequent encounter. EXAM: PORTABLE CHEST 1 VIEW COMPARISON:  09/20/2016. FINDINGS: Right central line tip proximal superior vena cava level. Nasogastric tube courses below the diaphragm. Tip is not included on the present exam. Progressive consolidation retrocardiac region may represent progressive infiltrate, atelectasis and/or pleural effusion. Subsegmental atelectasis right lung base. Heart size top-normal. IMPRESSION: Progressive consolidation retrocardiac region may represent progressive infiltrate, atelectasis and/or pleural effusion. Subsegmental atelectasis right lung base. Electronically Signed   By: Genia Del M.D.   On: 09/21/2016 07:02   Dg Abd Portable 1v  Result Date: 09/20/2016 CLINICAL DATA:  Abdominal distension EXAM: PORTABLE ABDOMEN - 1 VIEW COMPARISON:  Portable exam 0931 hours compared to 09/16/2016 FINDINGS: Tip of nasogastric tube projects over pyloric region. Surgical clips RIGHT upper quadrant likely cholecystectomy. Gas in stomach and throughout a normal caliber colon. No small bowel distention. Anastomotic staple line RIGHT mid abdomen. No definite bowel wall thickening. Degenerative changes lower lumbar spine. No urinary tract calcification. IMPRESSION: Gas throughout nondistended colon and within  stomach. Electronically Signed   By: Lavonia Dana M.D.   On: 09/20/2016 09:48    Anti-infectives: Anti-infectives    Start     Dose/Rate Route Frequency Ordered Stop   09/21/16 2230  vancomycin (VANCOCIN) IVPB 1000 mg/200 mL premix     1,000 mg 200 mL/hr over 60 Minutes Intravenous Every 8 hours 09/21/16 2210     09/21/16 1100  anidulafungin (ERAXIS) 100 mg in sodium chloride 0.9 % 100 mL IVPB     100 mg 78 mL/hr over 100 Minutes Intravenous Every 24 hours 09/20/16 1048     09/20/16 1100  anidulafungin (ERAXIS) 200 mg in sodium chloride 0.9 % 200 mL IVPB     200 mg 78 mL/hr over 200 Minutes Intravenous  Once 09/20/16 1048 09/20/16 1510   09/20/16 0145  vancomycin (VANCOCIN) IVPB 750 mg/150 ml premix  Status:  Discontinued     750 mg 150 mL/hr over 60 Minutes Intravenous Every 12 hours 09/20/16 0135 09/21/16 2210   09/19/16 0800  meropenem (MERREM) 1 g in sodium chloride 0.9 % 100 mL IVPB     1 g 200 mL/hr over 30 Minutes Intravenous Every 8 hours 09/19/16 0738     09/11/16 1300  metroNIDAZOLE (FLAGYL) IVPB 500 mg  Status:  Discontinued     500 mg 100 mL/hr over 60 Minutes Intravenous Every 8 hours 09/11/16 1208 09/16/16 0912   09/11/16 1300  meropenem (MERREM) 1 g in sodium chloride 0.9 % 100 mL IVPB  Status:  Discontinued     1 g 200 mL/hr over 30 Minutes Intravenous Every 8 hours 09/11/16 1259 09/16/16 0912      Assessment/Plan: POD 15 sbr with perforation, [place], Dr Brantley Stage  1. Neuro- continue current iv pain control 2. CV/Pulm- pulmonary toilet, respiratory status much improved 3. GI- continue ng tube to suction today, will have speech see again when ready to take po due to aspiration question although this appears to be abdominal related not primary pulmonary, npo, wound vac change MWF 4. ID- continue abx, await cultures, improving, if stops improving or worsens will discuss drainage of pelvis 5. Will need long term fu of gyn abnormalities on [place] 6. lovenox scds 7. Can  likely tx to stepdown today      [facility] 09/22/2016

## 2016-09-22 NOTE — Progress Notes (Signed)
SLP Cancellation Note  Patient Details Name: Brandi Leonard MRN: [id number] DOB: May 18, 1965   Cancelled treatment:       Reason Eval/Treat Not Completed: Medical issues which prohibited therapy  Gabriel Rainwater [place], CCC-SLP [phone]  [name] 09/22/2016, 8:25 AM

## 2016-09-22 NOTE — Progress Notes (Addendum)
PULMONARY / CRITICAL CARE MEDICINE   Name: Brandi Leonard MRN: [id number] DOB: Jun 01, 1965    ADMISSION DATE:  09/10/2016 CONSULTATION DATE:  09/10/2016  REFERRING MD:  Dr. Ger[facility]  CHIEF COMPLAINT:  shock  HISTORY OF PRESENT ILLNESS:   51 year old admitted to [facility] 6/11 with Small bowel obstruction, underwent emergent laparotomy on 6/12 for perforation with peritonitis. Failed extubation postoperatively and then developed diffuse bilateral infiltrates consistent with ARDS and septic shock requiring Levophed.  Extubated 6/21.  On 6/25 developed worsening respiratory failure, ?aspiration v HCAP, large volume stool output.  PCCM reconsulted.   STUDIES:  CT abdomen 6/11 > multiple dilated loops of fluid filled small bowel consistent with mechanical small bowel obstruction Echo 6/16 >> nml LV fn, mild apical pericardial effusion CTA chest 6/24>>> Neg PE, L>R bilat effusions, LLL consolidation with air bronchograms, RML volume loss  BLE venous doppler 6/25>>> neg  CT abd 6/25: Colon is diffusely dilated with fluid despite having a rectal tube. No significant small bowel dilatation.There are two fluid collections in the pelvis which were likely present prior to the recent surgery. The largest is in the left lower quadrant measuring over 7 cm and there is a smaller complex collection in the right anterior pelvic region measuring 4.4 cm. These could be adnexal cystic structures but indeterminate. Ideally these areas could be further characterized with a pelvic ultrasound but may be difficult with the fluid-filled colon. There are two new postoperative fluid collections along the lateral abdomen. These could represent pockets of loculated ascites or abscess collections. Trace left pleural fluid with consolidation or volume loss in the left lower lobe. Volume loss in the right lower lobe.  CULTURES: Blood 6/12 > Coag neg staph 1/2 bottles BC x 2 6/15>>> NEG Sputum 6/15>> few  candida CDiff 6/22>>> neg  BC x 2 6/24>>>  Surgical wound 6/26: rare GNR>>>  ANTIBIOTICS: Primaxin 6/12 > 6/21 Levaquin 6/12 >> 6/12 Flagyl 6/12 > 6/21 Meropenem 6/24>>>  Vanc 6/25>>>  anidulo 6/25>>>  SIGNIFICANT EVENTS: 6/11 admit for SBO 6/12 perf to OR for repair 6/13 contnue shock, resp failure 6/15 transfer to [facility] for ICU admit. 6/16 started diuresis 6/25- worsening resp status 6/26 IR drainage pus on left but NO drain required  LINES/TUBES: ETT 6/13 >6/21 R fem CVL 6/12 >6/15 Art line rt radial 6/15 > 6/21 IJ CVL 6/15>>>  SUBJECTIVE:  Temp and fever curve better Feels a little better  VITAL SIGNS: BP 111/69   Pulse 95   Temp 98.9 F (37.2 C) (Oral)   Resp 20   Ht 5\' 5"  (1.651 m)   Wt 116 lb 2.9 oz (52.7 kg)   LMP 09/16/2016   SpO2 100%   BMI 19.33 kg/m   HEMODYNAMICS:    VENTILATOR SETTINGS:    INTAKE / OUTPUT: I/O last 3 completed shifts: In: 7902 [I.V.:6085; NG/GT:60; IV Piggyback:1180] Out: 3400 [Urine:1480; Emesis/NG output:1350; Drains:20; Stool:550]  PHYSICAL EXAMINATION: Physical Exam  Constitutional: She is oriented to person, place, and time. She appears well-developed. She appears cachectic. She is cooperative. She has a sickly appearance. No distress.  HENT:  Head: Normocephalic and atraumatic.  Eyes: Conjunctivae, EOM and lids are normal. Right conjunctiva is not injected. No scleral icterus.  Neck: Trachea normal and normal range of motion. No JVD present.  Right IJ unremarkable  Phonation is stronger   Cardiovascular: Normal rate, regular rhythm, normal heart sounds and normal pulses.   Pulmonary/Chest: Effort normal. No accessory muscle usage. No respiratory distress. She  has decreased breath sounds in the right lower field and the left lower field.  Abdominal: Soft. Bowel sounds are decreased. There is tenderness.  Wound vac in intact   Genitourinary:  Genitourinary Comments: Clear yellow urine from foley cath    Musculoskeletal: Normal range of motion.  Neurological: She is alert and oriented to person, place, and time. She has normal strength. No cranial nerve deficit. GCS eye subscore is 4. GCS verbal subscore is 5. GCS motor subscore is 6.  Skin: Skin is warm, dry and intact. No abrasion and no petechiae noted. She is not diaphoretic. No pallor.  Psychiatric: She has a normal mood and affect. Judgment normal. Her speech is delayed. She is withdrawn. Cognition and memory are normal.    LABS:  BMET  Recent Labs Lab 09/21/16 1853 09/21/16 2135 09/22/16 0530  NA 148* 147* 148*  K 4.0 3.9 3.8  CL 111 113* 111  CO2 30 29 30   BUN 16 17 15   CREATININE 0.45 0.42* 0.45  GLUCOSE 111* 115* 103*   Electrolytes  Recent Labs Lab 09/19/16 0245  09/21/16 0250  09/21/16 1853 09/21/16 2135 09/22/16 0530  CALCIUM 8.5*  < > 7.8*  < > 7.5* 7.6* 7.9*  MG  --   < > 1.6*  --   --  2.3 2.1  PHOS 3.7  --  3.1  --   --   --  3.9  < > = values in this interval not displayed.  CBC  Recent Labs Lab 09/21/16 0250 09/21/16 2135 09/22/16 0530  WBC 24.3* 14.9* 14.7*  HGB 8.9* 7.9* 7.7*  HCT 28.9* 26.5* 26.0*  PLT 790* 633* 659*   Coag's No results for input(s): APTT, INR in the last 168 hours. Sepsis Markers  Recent Labs Lab 09/19/16 1608 09/20/16 0125 09/20/16 0914 09/21/16 0250 09/21/16 2135  LATICACIDVEN 1.2  --  1.2  --   --   PROCALCITON  --  0.35  --  0.27 0.23   ABG  Recent Labs Lab 09/19/16 1704 09/20/16 0640  PHART 7.384 7.377  PCO2ART 20.3* 20.3*  PO2ART 94.0 95.9   Liver Enzymes  Recent Labs Lab 09/21/16 0250 09/21/16 2135 09/22/16 0530  AST 57* 41 28  ALT 84* 85* 71*  ALKPHOS 252* 190* 175*  BILITOT 0.3 0.3 0.1*  ALBUMIN 1.8* 1.7* 1.6*    Cardiac Enzymes  Recent Labs Lab 09/19/16 0219 09/19/16 1100 09/19/16 1608  TROPONINI 0.04* 0.05* 0.05*   Glucose  Recent Labs Lab 09/20/16 1205 09/20/16 1606 09/20/16 2107 09/21/16 1215 09/21/16 1711  09/21/16 2112  GLUCAP 115* 180* 103* 114* 120* 110*    Imaging US Transvaginal Non-ob  Result Date: 09/21/2016 CLINICAL DATA:  51 year old with multiple intra-abdominal abscesses and free pelvic fluid on recent CT. EXAM: TRANSABDOMINAL AND TRANSVAGINAL ULTRASOUND OF PELVIS DOPPLER ULTRASOUND OF OVARIES TECHNIQUE: Both transabdominal and transvaginal ultrasound examinations of the pelvis were performed. Transabdominal technique was performed for global imaging of the pelvis including uterus, ovaries, adnexal regions, and pelvic cul-de-sac. It was necessary to proceed with endovaginal exam following the transabdominal exam to visualize the adnexa. Color and duplex Doppler ultrasound was utilized to evaluate blood flow to the ovaries. COMPARISON:  CT abdomen and pelvis 09/20/2016, 09/06/2016. FINDINGS: Uterus Measurements: Approximately 7.5 x 6.0 x 6.2 cm. Likely submucosal fibroid measuring approximately 2.6 x 3.4 x 2.8 cm as the noted on CT. Endometrium Not visualized either transabdominally or transvaginally. Right ovary Measurements: Approximately 6.8 x 6.3 x 7.7 cm. Large simple  cyst measuring approximately 6.7 x 5.6 x 7.2 cm. Normal color Doppler signal in the ovarian tissue. Left ovary Not visualized either transabdominally or transvaginally. Pulsed Doppler evaluation of the right ovary demonstrates normal low-resistance arterial and venous waveforms. Other findings Loculated fluid in the right adnexum. Loculated fluid versus hydrosalpinx involving the left adnexum. Semi-solid stool within the dilated rectum. IMPRESSION: 1. Approximate 7 cm cyst arising from the right ovary. 2. Approximate 3.4 cm uterine fibroid, likely submucosal in location. 3. Nonvisualization of the endometrium and left ovary. 4. Loculated fluid in both adnexa. The loculated fluid in the right adnexum has the appearance of an abscess. A loculated fluid in the left adnexum could be loculated fluid or a hydrosalpinx. Given the large  right ovarian cyst and the inability to see the endometrium and left ovary on today's examination, a follow-up pelvic ultrasound is recommended in 6 or 10 weeks, after the patient has recovered from her acute condition. Electronically Signed   By: Evangeline Dakin M.D.   On: 09/21/2016 17:08   US Pelvis Complete  Result Date: 09/21/2016 CLINICAL DATA:  51 year old with multiple intra-abdominal abscesses and free pelvic fluid on recent CT. EXAM: TRANSABDOMINAL AND TRANSVAGINAL ULTRASOUND OF PELVIS DOPPLER ULTRASOUND OF OVARIES TECHNIQUE: Both transabdominal and transvaginal ultrasound examinations of the pelvis were performed. Transabdominal technique was performed for global imaging of the pelvis including uterus, ovaries, adnexal regions, and pelvic cul-de-sac. It was necessary to proceed with endovaginal exam following the transabdominal exam to visualize the adnexa. Color and duplex Doppler ultrasound was utilized to evaluate blood flow to the ovaries. COMPARISON:  CT abdomen and pelvis 09/20/2016, 09/06/2016. FINDINGS: Uterus Measurements: Approximately 7.5 x 6.0 x 6.2 cm. Likely submucosal fibroid measuring approximately 2.6 x 3.4 x 2.8 cm as the noted on CT. Endometrium Not visualized either transabdominally or transvaginally. Right ovary Measurements: Approximately 6.8 x 6.3 x 7.7 cm. Large simple cyst measuring approximately 6.7 x 5.6 x 7.2 cm. Normal color Doppler signal in the ovarian tissue. Left ovary Not visualized either transabdominally or transvaginally. Pulsed Doppler evaluation of the right ovary demonstrates normal low-resistance arterial and venous waveforms. Other findings Loculated fluid in the right adnexum. Loculated fluid versus hydrosalpinx involving the left adnexum. Semi-solid stool within the dilated rectum. IMPRESSION: 1. Approximate 7 cm cyst arising from the right ovary. 2. Approximate 3.4 cm uterine fibroid, likely submucosal in location. 3. Nonvisualization of the endometrium  and left ovary. 4. Loculated fluid in both adnexa. The loculated fluid in the right adnexum has the appearance of an abscess. A loculated fluid in the left adnexum could be loculated fluid or a hydrosalpinx. Given the large right ovarian cyst and the inability to see the endometrium and left ovary on today's examination, a follow-up pelvic ultrasound is recommended in 6 or 10 weeks, after the patient has recovered from her acute condition. Electronically Signed   By: Evangeline Dakin M.D.   On: 09/21/2016 17:08   Ct Aspiration  Result Date: 09/21/2016 INDICATION: Postop small abdominal abscesses EXAM: CT GUIDED ASPIRATIONS OF SMALL BILATERAL ABDOMINAL ABSCESSES MEDICATIONS: The patient is currently admitted to the hospital and receiving intravenous antibiotics. The antibiotics were administered within an appropriate time frame prior to the initiation of the procedure. ANESTHESIA/SEDATION: 1.0 mg IV Versed 50 mcg IV Fentanyl Moderate Sedation Time:  15 MINUTES The patient was continuously monitored during the procedure by the interventional radiology nurse under my direct supervision. COMPLICATIONS: None immediate. TECHNIQUE: Informed written consent was obtained from the [place] after a  thorough discussion of the procedural risks, benefits and alternatives. All questions were addressed. Maximal Sterile Barrier Technique was utilized including caps, mask, sterile gowns, sterile gloves, sterile drape, hand hygiene and skin antiseptic. A timeout was performed prior to the initiation of the procedure. PROCEDURE: Previous imaging reviewed. Patient positioned supine. Noncontrast localization CT performed. The small bilateral pericolic gutter fluid collections by CT were localized. Overlying skin was marked bilaterally. Left lateral abdominal abscess aspiration: Under sterile conditions and local anesthesia, an 18 gauge trocar needle was advanced from a lateral approach into the small left lateral pericolic  gutter abscess. Syringe aspiration yielded 11 cc purulent fluid. This nearly collapsed the small abscess. Sample sent for culture. No immediate complication. Right lateral abdominal abscess aspiration: In a similar fashion, under sterile conditions and local anesthesia, a second 18 gauge needle was advanced from a lateral approach into the small right abdominal abscess. Syringe aspiration yielded only a few cc of thin serosanguineous fluid. Sample sent for culture. Needle removed. Patient tolerated the procedure well. No immediate complication. FINDINGS: CT imaging confirms percutaneous needle access of the bilateral pericolic gutter small fluid collections for needle aspiration IMPRESSION: Successful CT-guided bilateral aspirations of the small abdominal abscesses as above. Electronically Signed   By: Jerilynn Mages.  Shick M.D.   On: 09/21/2016 16:28   Ct Aspiration  Result Date: 09/21/2016 INDICATION: Postop small abdominal abscesses EXAM: CT GUIDED ASPIRATIONS OF SMALL BILATERAL ABDOMINAL ABSCESSES MEDICATIONS: The patient is currently admitted to the hospital and receiving intravenous antibiotics. The antibiotics were administered within an appropriate time frame prior to the initiation of the procedure. ANESTHESIA/SEDATION: 1.0 mg IV Versed 50 mcg IV Fentanyl Moderate Sedation Time:  15 MINUTES The patient was continuously monitored during the procedure by the interventional radiology nurse under my direct supervision. COMPLICATIONS: None immediate. TECHNIQUE: Informed written consent was obtained from the [place] after a thorough discussion of the procedural risks, benefits and alternatives. All questions were addressed. Maximal Sterile Barrier Technique was utilized including caps, mask, sterile gowns, sterile gloves, sterile drape, hand hygiene and skin antiseptic. A timeout was performed prior to the initiation of the procedure. PROCEDURE: Previous imaging reviewed. Patient positioned supine. Noncontrast  localization CT performed. The small bilateral pericolic gutter fluid collections by CT were localized. Overlying skin was marked bilaterally. Left lateral abdominal abscess aspiration: Under sterile conditions and local anesthesia, an 18 gauge trocar needle was advanced from a lateral approach into the small left lateral pericolic gutter abscess. Syringe aspiration yielded 11 cc purulent fluid. This nearly collapsed the small abscess. Sample sent for culture. No immediate complication. Right lateral abdominal abscess aspiration: In a similar fashion, under sterile conditions and local anesthesia, a second 18 gauge needle was advanced from a lateral approach into the small right abdominal abscess. Syringe aspiration yielded only a few cc of thin serosanguineous fluid. Sample sent for culture. Needle removed. Patient tolerated the procedure well. No immediate complication. FINDINGS: CT imaging confirms percutaneous needle access of the bilateral pericolic gutter small fluid collections for needle aspiration IMPRESSION: Successful CT-guided bilateral aspirations of the small abdominal abscesses as above. Electronically Signed   By: Jerilynn Mages.  Shick M.D.   On: 09/21/2016 16:28   [place] Art/ven Flow Abd Pelv Doppler  Result Date: 09/21/2016 CLINICAL DATA:  51 year old with multiple intra-abdominal abscesses and free pelvic fluid on recent CT. EXAM: TRANSABDOMINAL AND TRANSVAGINAL ULTRASOUND OF PELVIS DOPPLER ULTRASOUND OF OVARIES TECHNIQUE: Both transabdominal and transvaginal ultrasound examinations of the pelvis were performed. Transabdominal technique was performed for global  imaging of the pelvis including uterus, ovaries, adnexal regions, and pelvic cul-de-sac. It was necessary to proceed with endovaginal exam following the transabdominal exam to visualize the adnexa. Color and duplex Doppler ultrasound was utilized to evaluate blood flow to the ovaries. COMPARISON:  CT abdomen and pelvis 09/20/2016, 09/06/2016. FINDINGS:  Uterus Measurements: Approximately 7.5 x 6.0 x 6.2 cm. Likely submucosal fibroid measuring approximately 2.6 x 3.4 x 2.8 cm as the noted on CT. Endometrium Not visualized either transabdominally or transvaginally. Right ovary Measurements: Approximately 6.8 x 6.3 x 7.7 cm. Large simple cyst measuring approximately 6.7 x 5.6 x 7.2 cm. Normal color Doppler signal in the ovarian tissue. Left ovary Not visualized either transabdominally or transvaginally. Pulsed Doppler evaluation of the right ovary demonstrates normal low-resistance arterial and venous waveforms. Other findings Loculated fluid in the right adnexum. Loculated fluid versus hydrosalpinx involving the left adnexum. Semi-solid stool within the dilated rectum. IMPRESSION: 1. Approximate 7 cm cyst arising from the right ovary. 2. Approximate 3.4 cm uterine fibroid, likely submucosal in location. 3. Nonvisualization of the endometrium and left ovary. 4. Loculated fluid in both adnexa. The loculated fluid in the right adnexum has the appearance of an abscess. A loculated fluid in the left adnexum could be loculated fluid or a hydrosalpinx. Given the large right ovarian cyst and the inability to see the endometrium and left ovary on today's examination, a follow-up pelvic ultrasound is recommended in 6 or 10 weeks, after the patient has recovered from her acute condition. Electronically Signed   By: Evangeline Dakin M.D.   On: 09/21/2016 17:08    DISCUSSION:  51yo female post op emergent exp lap for SBO with perforation and peritonitis, respiratory failure/ARDS and septic shock.  Extubated 6/21, now with worsening respiratory failure r/t aspiration v HCAP.   6/27 S/p CT guided drainage of abd abscess on 6/26. Also had NGT placed to suction. She looks much better.  Move to SDU Change IVF to 1/2 ns to correct water balance Cont abd and antifungal Cont wound care NGT to stay at [facility] and surgical team to determine when gut to be used.  Focus on Pulm  hygiene    ASSESSMENT / PLAN:  PULMONARY A: Acute hypoxemic respiratory failure initially in setting of aspiration pna and ards (extubated 6/21) COPD without acute exacerbation ->pcxr personally reviewed 6/26: rotated film. Basilar atx. No sig change (CT abd w/ bibasilar atx and small left effusion) ->LE dopplers negative  ->atx d/t abd pain and hypoventilation biggest contributing factor to hypoxia here. Her work of breathing is better today after placing NGT to suction.  P:   Wean O2 pulm hygiene  PRN CXR   CARDIOVASCULAR A:  Shock: likely septic due to peritonitis - resolved.  Demand ischemia -trop peak at 1.0 H/o HLD Tachycardia - volume status concerns SIRS  P:  Cont tele Cont IVFs  OK for SDU   RENAL A:   Fluid and electrolyte d/o: hypernatremia (improving), hyperchloremia (resolved) NAGMA (resolved) P:   Change LR to 1/2 NS at 125 ml/hr F/u am chemistry  Replace lytes as needed.   GASTROINTESTINAL A:   SBO complicated by perforation now s/p repair 6/12; c/b post op abd pain, colitis and what appears to be intra-abd abscess by CT scan 6/25 Severe protein cal malnutrition Large volume stool output - CDiff neg  Shock liver  P:   Cont wound vac per surgeons (MWF) Tend LFTs NGT to sxn; defer timing of using gut to surgical team  Will  need to watch clinically.   HEMATOLOGIC A:   Anemia of critical illness. No evidence of bleeding --> hgb drifted down 7.7 no obvious bleeding  Thrombocytosis  P:  Trend CBC Transfuse per protocol Cont [place] heparin   INFECTIOUS A:   Septic shock secondary to peritonitis - resolved  W/ on-going sepsis in setting of intra-abd abscess  S/p CT guided drainage of small bilateral abd abscesses 6/26 P:   Day # 3 meropenem and vanc (this is her second course of abx since stopped on 6/22) Cont Eraxis   ENDOCRINE A:   Relative Adrenal insufficiency (cortisol 12 at [place])  Intermittent hyperglycemia  P:    ssi  NEUROLOGIC A:   Acute metabolic encephalopathy Occasional jerking movements -resolved Seizure - ?benzo withdrawal  Post op pain  P:   Cont Clonazepam as ODT (on reduced dosing from home and doing well) PRN fent for pain  Watch for seizure   FAMILY  - Updates: no family at bedside 6/25    Erick Colace ACNP-BC [place] Pager # [phone] OR # [phone] if no answer  STAFF NOTE: I, Merrie Roof, MD FACP have personally reviewed patient's available data, including medical history, events of note, physical examination and test results as part of my evaluation. I have discussed with resident/NP and other care providers such as pharmacist, RN and RRT. In addition, I personally evaluated patient and elicited key findings of: awake, alert, no distress, rr wnl, abdo nontender, no r/g, wound clean, limited edema lowers, her neurostatus is also improved, PT needed, IS when able, upright as able, MAP has been fine after drainage abscess, keep same course abx and would consider 8 days empiric antifungls but narrow diflucan in 1-2 days iof NO non albicans noted, consider dc vanc, maintain mero, NA noted 148, change LR to d5w, bmet in pm and am, follow output from below whish is improved and have been reluctant to slow, diet per surgery, may start in am , if not in need tpn over next 48 hrs dc line neck   Lavon Paganini. Titus Mould, MD, [place] Pgr: [place] 09/22/2016 10:58 AM

## 2016-09-23 ENCOUNTER — Inpatient Hospital Stay (HOSPITAL_COMMUNITY): Payer: BLUE CROSS/BLUE SHIELD

## 2016-09-23 DIAGNOSIS — E2740 Unspecified adrenocortical insufficiency: Secondary | ICD-10-CM

## 2016-09-23 DIAGNOSIS — J690 Pneumonitis due to inhalation of food and vomit: Secondary | ICD-10-CM

## 2016-09-23 DIAGNOSIS — E43 Unspecified severe protein-calorie malnutrition: Secondary | ICD-10-CM

## 2016-09-23 DIAGNOSIS — K7200 Acute and subacute hepatic failure without coma: Secondary | ICD-10-CM

## 2016-09-23 DIAGNOSIS — R570 Cardiogenic shock: Secondary | ICD-10-CM

## 2016-09-23 DIAGNOSIS — J80 Acute respiratory distress syndrome: Secondary | ICD-10-CM

## 2016-09-23 DIAGNOSIS — J9621 Acute and chronic respiratory failure with hypoxia: Secondary | ICD-10-CM

## 2016-09-23 DIAGNOSIS — K56609 Unspecified intestinal obstruction, unspecified as to partial versus complete obstruction: Secondary | ICD-10-CM

## 2016-09-23 DIAGNOSIS — J441 Chronic obstructive pulmonary disease with (acute) exacerbation: Secondary | ICD-10-CM

## 2016-09-23 LAB — CBC WITH DIFFERENTIAL/PLATELET
BASOS ABS: 0 10*3/uL (ref 0.0–0.1)
Basophils Relative: 0 %
Eosinophils Absolute: 0.1 10*3/uL (ref 0.0–0.7)
Eosinophils Relative: 1 %
HEMATOCRIT: 27 % — AB (ref 36.0–46.0)
Hemoglobin: 8 g/dL — ABNORMAL LOW (ref 12.0–15.0)
LYMPHS PCT: 12 %
Lymphs Abs: 1.2 10*3/uL (ref 0.7–4.0)
MCH: 24.4 pg — ABNORMAL LOW (ref 26.0–34.0)
MCHC: 29.6 g/dL — ABNORMAL LOW (ref 30.0–36.0)
MCV: 82.3 fL (ref 78.0–100.0)
Monocytes Absolute: 0.5 10*3/uL (ref 0.1–1.0)
Monocytes Relative: 4 %
NEUTROS ABS: 8.7 10*3/uL — AB (ref 1.7–7.7)
Neutrophils Relative %: 83 %
Platelets: 624 10*3/uL — ABNORMAL HIGH (ref 150–400)
RBC: 3.28 MIL/uL — AB (ref 3.87–5.11)
RDW: 17.8 % — ABNORMAL HIGH (ref 11.5–15.5)
WBC: 10.4 10*3/uL (ref 4.0–10.5)

## 2016-09-23 LAB — COMPREHENSIVE METABOLIC PANEL
ALK PHOS: 127 U/L — AB (ref 38–126)
ALT: 44 U/L (ref 14–54)
AST: 17 U/L (ref 15–41)
Albumin: 1.5 g/dL — ABNORMAL LOW (ref 3.5–5.0)
Anion gap: 7 (ref 5–15)
BILIRUBIN TOTAL: 0.3 mg/dL (ref 0.3–1.2)
BUN: 11 mg/dL (ref 6–20)
CALCIUM: 7.9 mg/dL — AB (ref 8.9–10.3)
CO2: 30 mmol/L (ref 22–32)
CREATININE: 0.36 mg/dL — AB (ref 0.44–1.00)
Chloride: 104 mmol/L (ref 101–111)
GFR calc Af Amer: >60 mL/min (ref >60)
Glucose, Bld: 108 mg/dL — ABNORMAL HIGH (ref 65–99)
Potassium: 3.1 mmol/L — ABNORMAL LOW (ref 3.5–5.1)
Sodium: 141 mmol/L (ref 135–145)
TOTAL PROTEIN: 4.5 g/dL — AB (ref 6.5–8.1)

## 2016-09-23 LAB — GLUCOSE, CAPILLARY
GLUCOSE-CAPILLARY: 105 mg/dL — AB (ref 65–99)
GLUCOSE-CAPILLARY: 103 mg/dL — AB (ref 65–99)
GLUCOSE-CAPILLARY: 102 mg/dL — AB (ref 65–99)
Glucose-Capillary: 103 mg/dL — ABNORMAL HIGH (ref 65–99)
Glucose-Capillary: 95 mg/dL (ref 65–99)

## 2016-09-23 LAB — MISC LABCORP TEST (SEND OUT): LABCORP TEST CODE: 284526

## 2016-09-23 LAB — MAGNESIUM: MAGNESIUM: 1.8 mg/dL (ref 1.7–2.4)

## 2016-09-23 MED ORDER — IPRATROPIUM BROMIDE 0.02 % IN SOLN: 0.5000 mg | Freq: Four times a day (QID) | RESPIRATORY_TRACT | Status: DC | PRN

## 2016-09-23 MED ORDER — MAGNESIUM SULFATE 2 GM/50ML IV SOLN
2.0000 g | Freq: Once | INTRAVENOUS | Status: AC
  Administered 2016-09-23: 2 g via INTRAVENOUS
  Filled 2016-09-23: qty 50

## 2016-09-23 MED ORDER — POTASSIUM CHLORIDE 10 MEQ/50ML IV SOLN
10.0000 meq | INTRAVENOUS | Status: AC
  Administered 2016-09-23 (×6): 10 meq via INTRAVENOUS
  Filled 2016-09-23 (×6): qty 50

## 2016-09-23 MED ORDER — LEVALBUTEROL HCL 0.63 MG/3ML IN NEBU: 0.6300 mg | INHALATION_SOLUTION | Freq: Four times a day (QID) | RESPIRATORY_TRACT | Status: DC | PRN

## 2016-09-23 NOTE — Progress Notes (Signed)
eLink Physician-Brief Progress Note Patient Name: Brandi Leonard DOB: 10/24/1965 MRN: [id number]   Date of Service  09/23/2016  HPI/Events of Note  Hypokalemia and moderately low Mag  eICU Interventions  Potassium and mag replaced     Intervention Category Intermediate Interventions: Electrolyte abnormality - evaluation and management  [name] 09/23/2016, 5:32 AM

## 2016-09-23 NOTE — Progress Notes (Signed)
Subjective/Chief Complaint: States no real flatus or bm yesterday, ng listed with 1300 out, feels better overall   Objective: Vital signs in last 24 hours: Temp:  [98 F (36.7 C)-98.9 F (37.2 C)] 98.4 F (36.9 C) (06/28 0341) Pulse Rate:  [80-101] 80 (06/28 0700) Resp:  [13-23] 15 (06/28 0700) BP: (101-129)/(63-77) 119/71 (06/28 0700) SpO2:  [96 %-100 %] 98 % (06/28 0700) Weight:  [53.3 kg (117 lb 8.1 oz)] 53.3 kg (117 lb 8.1 oz) (06/28 0500) Last BM Date: 09/22/16  Intake/Output from previous day: 06/27 0701 - 06/28 0700 In: 2768.8 [I.V.:1918.8; NG/GT:120; IV Piggyback:730] Out: 0093 [Urine:1400; Emesis/NG output:1300; Drains:30; Stool:125] Intake/Output this shift: No intake/output data recorded.  GI: mild distended, some bs, vac in place, approp tender  Lab Results:   Recent Labs  09/22/16 0530 09/23/16 0430  WBC 14.7* 10.4  HGB 7.7* 8.0*  HCT 26.0* 27.0*  PLT 659* 624*   BMET  Recent Labs  09/22/16 1806 09/23/16 0430  NA 144 141  K 3.2* 3.1*  CL 109 104  CO2 29 30  GLUCOSE 108* 108*  BUN 14 11  CREATININE 0.34* 0.36*  CALCIUM 7.9* 7.9*   PT/INR No results for input(s): LABPROT, INR in the last 72 hours. ABG No results for input(s): PHART, HCO3 in the last 72 hours.  Invalid input(s): PCO2, PO2  Studies/Results: US Transvaginal Non-ob  Result Date: 09/21/2016 CLINICAL DATA:  51 year old with multiple intra-abdominal abscesses and free pelvic fluid on recent CT. EXAM: TRANSABDOMINAL AND TRANSVAGINAL ULTRASOUND OF PELVIS DOPPLER ULTRASOUND OF OVARIES TECHNIQUE: Both transabdominal and transvaginal ultrasound examinations of the pelvis were performed. Transabdominal technique was performed for global imaging of the pelvis including uterus, ovaries, adnexal regions, and pelvic cul-de-sac. It was necessary to proceed with endovaginal exam following the transabdominal exam to visualize the adnexa. Color and duplex Doppler ultrasound was utilized to  evaluate blood flow to the ovaries. COMPARISON:  CT abdomen and pelvis 09/20/2016, 09/06/2016. FINDINGS: Uterus Measurements: Approximately 7.5 x 6.0 x 6.2 cm. Likely submucosal fibroid measuring approximately 2.6 x 3.4 x 2.8 cm as the noted on CT. Endometrium Not visualized either transabdominally or transvaginally. Right ovary Measurements: Approximately 6.8 x 6.3 x 7.7 cm. Large simple cyst measuring approximately 6.7 x 5.6 x 7.2 cm. Normal color Doppler signal in the ovarian tissue. Left ovary Not visualized either transabdominally or transvaginally. Pulsed Doppler evaluation of the right ovary demonstrates normal low-resistance arterial and venous waveforms. Other findings Loculated fluid in the right adnexum. Loculated fluid versus hydrosalpinx involving the left adnexum. Semi-solid stool within the dilated rectum. IMPRESSION: 1. Approximate 7 cm cyst arising from the right ovary. 2. Approximate 3.4 cm uterine fibroid, likely submucosal in location. 3. Nonvisualization of the endometrium and left ovary. 4. Loculated fluid in both adnexa. The loculated fluid in the right adnexum has the appearance of an abscess. A loculated fluid in the left adnexum could be loculated fluid or a hydrosalpinx. Given the large right ovarian cyst and the inability to see the endometrium and left ovary on today's examination, a follow-up pelvic ultrasound is recommended in 6 or 10 weeks, after the patient has recovered from her acute condition. Electronically Signed   By: Evangeline Dakin M.D.   On: 09/21/2016 17:08   US Pelvis Complete  Result Date: 09/21/2016 CLINICAL DATA:  51 year old with multiple intra-abdominal abscesses and free pelvic fluid on recent CT. EXAM: TRANSABDOMINAL AND TRANSVAGINAL ULTRASOUND OF PELVIS DOPPLER ULTRASOUND OF OVARIES TECHNIQUE: Both transabdominal and transvaginal ultrasound examinations of  the pelvis were performed. Transabdominal technique was performed for global imaging of the pelvis  including uterus, ovaries, adnexal regions, and pelvic cul-de-sac. It was necessary to proceed with endovaginal exam following the transabdominal exam to visualize the adnexa. Color and duplex Doppler ultrasound was utilized to evaluate blood flow to the ovaries. COMPARISON:  CT abdomen and pelvis 09/20/2016, 09/06/2016. FINDINGS: Uterus Measurements: Approximately 7.5 x 6.0 x 6.2 cm. Likely submucosal fibroid measuring approximately 2.6 x 3.4 x 2.8 cm as the noted on CT. Endometrium Not visualized either transabdominally or transvaginally. Right ovary Measurements: Approximately 6.8 x 6.3 x 7.7 cm. Large simple cyst measuring approximately 6.7 x 5.6 x 7.2 cm. Normal color Doppler signal in the ovarian tissue. Left ovary Not visualized either transabdominally or transvaginally. Pulsed Doppler evaluation of the right ovary demonstrates normal low-resistance arterial and venous waveforms. Other findings Loculated fluid in the right adnexum. Loculated fluid versus hydrosalpinx involving the left adnexum. Semi-solid stool within the dilated rectum. IMPRESSION: 1. Approximate 7 cm cyst arising from the right ovary. 2. Approximate 3.4 cm uterine fibroid, likely submucosal in location. 3. Nonvisualization of the endometrium and left ovary. 4. Loculated fluid in both adnexa. The loculated fluid in the right adnexum has the appearance of an abscess. A loculated fluid in the left adnexum could be loculated fluid or a hydrosalpinx. Given the large right ovarian cyst and the inability to see the endometrium and left ovary on today's examination, a follow-up pelvic ultrasound is recommended in 6 or 10 weeks, after the patient has recovered from her acute condition. Electronically Signed   By: Evangeline Dakin M.D.   On: 09/21/2016 17:08   Ct Aspiration  Result Date: 09/21/2016 INDICATION: Postop small abdominal abscesses EXAM: CT GUIDED ASPIRATIONS OF SMALL BILATERAL ABDOMINAL ABSCESSES MEDICATIONS: The patient is currently  admitted to the hospital and receiving intravenous antibiotics. The antibiotics were administered within an appropriate time frame prior to the initiation of the procedure. ANESTHESIA/SEDATION: 1.0 mg IV Versed 50 mcg IV Fentanyl Moderate Sedation Time:  15 MINUTES The patient was continuously monitored during the procedure by the interventional radiology nurse under my direct supervision. COMPLICATIONS: None immediate. TECHNIQUE: Informed written consent was obtained from the [facility] after a thorough discussion of the procedural risks, benefits and alternatives. All questions were addressed. Maximal Sterile Barrier Technique was utilized including caps, mask, sterile gowns, sterile gloves, sterile drape, hand hygiene and skin antiseptic. A timeout was performed prior to the initiation of the procedure. PROCEDURE: Previous imaging reviewed. Patient positioned supine. Noncontrast localization CT performed. The small bilateral pericolic gutter fluid collections by CT were localized. Overlying skin was marked bilaterally. Left lateral abdominal abscess aspiration: Under sterile conditions and local anesthesia, an 18 gauge trocar needle was advanced from a lateral approach into the small left lateral pericolic gutter abscess. Syringe aspiration yielded 11 cc purulent fluid. This nearly collapsed the small abscess. Sample sent for culture. No immediate complication. Right lateral abdominal abscess aspiration: In a similar fashion, under sterile conditions and local anesthesia, a second 18 gauge needle was advanced from a lateral approach into the small right abdominal abscess. Syringe aspiration yielded only a few cc of thin serosanguineous fluid. Sample sent for culture. Needle removed. Patient tolerated the procedure well. No immediate complication. FINDINGS: CT imaging confirms percutaneous needle access of the bilateral pericolic gutter small fluid collections for needle aspiration IMPRESSION: Successful  CT-guided bilateral aspirations of the small abdominal abscesses as above. Electronically Signed   By: Jerilynn Mages.  Shick M.D.  On: 09/21/2016 16:28   Ct Aspiration  Result Date: 09/21/2016 INDICATION: Postop small abdominal abscesses EXAM: CT GUIDED ASPIRATIONS OF SMALL BILATERAL ABDOMINAL ABSCESSES MEDICATIONS: The patient is currently admitted to the hospital and receiving intravenous antibiotics. The antibiotics were administered within an appropriate time frame prior to the initiation of the procedure. ANESTHESIA/SEDATION: 1.0 mg IV Versed 50 mcg IV Fentanyl Moderate Sedation Time:  15 MINUTES The patient was continuously monitored during the procedure by the interventional radiology nurse under my direct supervision. COMPLICATIONS: None immediate. TECHNIQUE: Informed written consent was obtained from the [facility] after a thorough discussion of the procedural risks, benefits and alternatives. All questions were addressed. Maximal Sterile Barrier Technique was utilized including caps, mask, sterile gowns, sterile gloves, sterile drape, hand hygiene and skin antiseptic. A timeout was performed prior to the initiation of the procedure. PROCEDURE: Previous imaging reviewed. Patient positioned supine. Noncontrast localization CT performed. The small bilateral pericolic gutter fluid collections by CT were localized. Overlying skin was marked bilaterally. Left lateral abdominal abscess aspiration: Under sterile conditions and local anesthesia, an 18 gauge trocar needle was advanced from a lateral approach into the small left lateral pericolic gutter abscess. Syringe aspiration yielded 11 cc purulent fluid. This nearly collapsed the small abscess. Sample sent for culture. No immediate complication. Right lateral abdominal abscess aspiration: In a similar fashion, under sterile conditions and local anesthesia, a second 18 gauge needle was advanced from a lateral approach into the small right abdominal abscess.  Syringe aspiration yielded only a few cc of thin serosanguineous fluid. Sample sent for culture. Needle removed. Patient tolerated the procedure well. No immediate complication. FINDINGS: CT imaging confirms percutaneous needle access of the bilateral pericolic gutter small fluid collections for needle aspiration IMPRESSION: Successful CT-guided bilateral aspirations of the small abdominal abscesses as above. Electronically Signed   By: Jerilynn Mages.  Shick M.D.   On: 09/21/2016 16:28   [place] Art/ven Flow Abd Pelv Doppler  Result Date: 09/21/2016 CLINICAL DATA:  51 year old with multiple intra-abdominal abscesses and free pelvic fluid on recent CT. EXAM: TRANSABDOMINAL AND TRANSVAGINAL ULTRASOUND OF PELVIS DOPPLER ULTRASOUND OF OVARIES TECHNIQUE: Both transabdominal and transvaginal ultrasound examinations of the pelvis were performed. Transabdominal technique was performed for global imaging of the pelvis including uterus, ovaries, adnexal regions, and pelvic cul-de-sac. It was necessary to proceed with endovaginal exam following the transabdominal exam to visualize the adnexa. Color and duplex Doppler ultrasound was utilized to evaluate blood flow to the ovaries. COMPARISON:  CT abdomen and pelvis 09/20/2016, 09/06/2016. FINDINGS: Uterus Measurements: Approximately 7.5 x 6.0 x 6.2 cm. Likely submucosal fibroid measuring approximately 2.6 x 3.4 x 2.8 cm as the noted on CT. Endometrium Not visualized either transabdominally or transvaginally. Right ovary Measurements: Approximately 6.8 x 6.3 x 7.7 cm. Large simple cyst measuring approximately 6.7 x 5.6 x 7.2 cm. Normal color Doppler signal in the ovarian tissue. Left ovary Not visualized either transabdominally or transvaginally. Pulsed Doppler evaluation of the right ovary demonstrates normal low-resistance arterial and venous waveforms. Other findings Loculated fluid in the right adnexum. Loculated fluid versus hydrosalpinx involving the left adnexum. Semi-solid stool  within the dilated rectum. IMPRESSION: 1. Approximate 7 cm cyst arising from the right ovary. 2. Approximate 3.4 cm uterine fibroid, likely submucosal in location. 3. Nonvisualization of the endometrium and left ovary. 4. Loculated fluid in both adnexa. The loculated fluid in the right adnexum has the appearance of an abscess. A loculated fluid in the left adnexum could be loculated fluid or a hydrosalpinx. Given  the large right ovarian cyst and the inability to see the endometrium and left ovary on today's examination, a follow-up pelvic ultrasound is recommended in 6 or 10 weeks, after the patient has recovered from her acute condition. Electronically Signed   By: Evangeline Dakin M.D.   On: 09/21/2016 17:08    Anti-infectives: Anti-infectives    Start     Dose/Rate Route Frequency Ordered Stop   09/21/16 2230  vancomycin (VANCOCIN) IVPB 1000 mg/200 mL premix  Status:  Discontinued     1,000 mg 200 mL/hr over 60 Minutes Intravenous Every 8 hours 09/21/16 2210 09/22/16 1102   09/21/16 1100  anidulafungin (ERAXIS) 100 mg in sodium chloride 0.9 % 100 mL IVPB     100 mg 78 mL/hr over 100 Minutes Intravenous Every 24 hours 09/20/16 1048     09/20/16 1100  anidulafungin (ERAXIS) 200 mg in sodium chloride 0.9 % 200 mL IVPB     200 mg 78 mL/hr over 200 Minutes Intravenous  Once 09/20/16 1048 09/20/16 1510   09/20/16 0145  vancomycin (VANCOCIN) IVPB 750 mg/150 ml premix  Status:  Discontinued     750 mg 150 mL/hr over 60 Minutes Intravenous Every 12 hours 09/20/16 0135 09/21/16 2210   09/19/16 0800  meropenem (MERREM) 1 g in sodium chloride 0.9 % 100 mL IVPB     1 g 200 mL/hr over 30 Minutes Intravenous Every 8 hours 09/19/16 0738     09/11/16 1300  metroNIDAZOLE (FLAGYL) IVPB 500 mg  Status:  Discontinued     500 mg 100 mL/hr over 60 Minutes Intravenous Every 8 hours 09/11/16 1208 09/16/16 0912   09/11/16 1300  meropenem (MERREM) 1 g in sodium chloride 0.9 % 100 mL IVPB  Status:  Discontinued      1 g 200 mL/hr over 30 Minutes Intravenous Every 8 hours 09/11/16 1259 09/16/16 0912      Assessment/Plan: POD 16 sbr with perforation, [facility] [place], Dr Brantley Stage  1. Neuro- continue current iv pain control 2. CV/Pulm- pulmonary toilet, respiratory status much improved 3. GI- continue ng tube to suction today, will have speech see again when ready to take po due to aspiration question although this appears to be abdominal related not primary pulmonary, npo, wound vac,  change MWF, check xray today may have ileus, if this is case and ng staying to suction she should start tna- would like to avoid but needs nutrition and with intraabdominal infection does not appear to have any return of bowel function yet and is much better with ng to suction 4. ID- continue abx, await cultures, improving, if stops improving or worsens will discuss drainage of pelvis, wbc is normal today 5. Will need long term fu of gyn abnormalities on [place] 6. lovenox scds 7. Can likely tx to stepdown   [facility] 09/23/2016

## 2016-09-23 NOTE — Progress Notes (Addendum)
Nutrition Follow-up  DOCUMENTATION CODES:   Severe malnutrition in context of acute illness/injury  INTERVENTION:    When able, start trickle TF via NG with Vital AF 1.2 at 20 ml/h.  May benefit from post-pyloric tube.  NUTRITION DIAGNOSIS:   Malnutrition (Severe) related to  (SBO w/ perferation s/p SBR) as evidenced by severe fluid accumulation, energy intake < or equal to 50% for > or equal to 5 days.  Ongoing  GOAL:   Patient will meet greater than or equal to 90% of their needs  Unmet  MONITOR:   TF tolerance, Skin, Labs, I & O's  ASSESSMENT:   51 yo female with no PMH who was admitted to [facility] on 6/11 with SBO, s/p emergent laparotomy 6/12 for perforation with peritonitis. She developed ARDS and septic shock after surgery. Transferred to [facility] on 6/15 for further care.  TF was initiated on 6/22 and TPN was discontinued on 6/23. TF stopped 6/25 and NG placed to suction due to colonic dilation with abdominal distention and discomfort. S/P CT aspiration of bilateral small abdominal abscesses on 6/26.  NGT currently to suction. 1300+ ml output 6/27.  Rectal tube in place for ongoing liquid stools. Hopefully will be able to start trickle feedings soon and can avoid restarting TPN. Labs reviewed: potassium 3.1 (L) Medications reviewed and include KCl.  Diet Order:  Diet NPO time specified  Skin:  Wound (see comment) (full thickness post op wound with VAC)  Last BM:  6/27  Height:   Ht Readings from Last 1 Encounters:  09/10/16 5\' 5"  (1.651 m)    Weight:   Wt Readings from Last 1 Encounters:  09/23/16 117 lb 8.1 oz (53.3 kg)    Ideal Body Weight:  56.82 kg  BMI:  Body mass index is 19.55 kg/m.  Estimated Nutritional Needs:   Kcal:  1800-2000  Protein:  95-110 gm  Fluid:  1.8-2 L  EDUCATION NEEDS:   No education needs identified at this time  Molli Barrows, [place], [place], [place] Pager [phone] After Hours Pager [phone]

## 2016-09-23 NOTE — Evaluation (Signed)
Physical Therapy Evaluation Patient Details Name: Brandi Leonard MRN: [id number] DOB: 01/18/1966 Today's Date: 09/23/2016   History of Present Illness  Pt adm to [facility] with SBO and perforation/peritonitis and underwent repair on 6/12. Failed extubation post-op and developed ARDS. Transferred to [facility] on 6/15. Extubated 6/21. Pt underwent drainage of abd abcesses on 6/26.  PMH - copd, sbo, anxiety, cholecystitis  Clinical Impression  Pt admitted with above diagnosis and presents to PT with functional limitations due to deficits listed below (See PT problem list). Pt needs skilled PT to maximize independence and safety to allow discharge to home with family. Expect pt will make good progress and will return home without further need for PT.     Follow Up Recommendations No PT follow up    Equipment Recommendations  Other (comment) (To be assessed)    Recommendations for Other Services       Precautions / Restrictions Precautions Precautions: Fall Restrictions Weight Bearing Restrictions: No      Mobility  Bed Mobility Overal bed mobility: Needs Assistance Bed Mobility: Sit to Sidelying         Sit to sidelying: Min assist;+2 for safety/equipment General bed mobility comments: Assist to bring legs up into bed  Transfers Overall transfer level: Needs assistance Equipment used: 1 person hand held assist Transfers: Sit to/from [facility] Sit to Stand: Min assist;+2 safety/equipment Stand pivot transfers: Min assist;+2 safety/equipment       General transfer comment: Assist to bring hips up and for balance. Pt with unsteady pivotal steps from chair to bsc to bed with bilateral shelf arm support for balance  Ambulation/Gait                Stairs            Wheelchair Mobility    Modified Rankin (Stroke Patients Only)       Balance Overall balance assessment: Needs assistance Sitting-balance support: No upper extremity  supported;Feet supported Sitting balance-Leahy Scale: Fair     Standing balance support: Single extremity supported Standing balance-Leahy Scale: Poor Standing balance comment: UE support and min assist for static standing                             Pertinent Vitals/Pain Pain Assessment: Faces Faces Pain Scale: Hurts little more Pain Location: abdomen Pain Descriptors / Indicators: Grimacing;Guarding Pain Intervention(s): Limited activity within patient's tolerance;Repositioned    Home Living Family/patient expects to be discharged to:: Private residence Living Arrangements: Parent Available Help at Discharge: Family;Available 24 hours/day Type of Home: House Home Access: Level entry     Home Layout: One level Home Equipment: None      Prior Function Level of Independence: Independent         Comments: Works at [employer]        Extremity/Trunk Assessment   Upper Extremity Assessment Upper Extremity Assessment: Generalized weakness    Lower Extremity Assessment Lower Extremity Assessment: Generalized weakness       Communication   Communication: No difficulties  Cognition Arousal/Alertness: Awake/alert Behavior During Therapy: WFL for tasks assessed/performed Overall Cognitive Status: Within Functional Limits for tasks assessed                                        General Comments  Exercises     Assessment/Plan    PT Assessment Patient needs continued PT services  PT Problem List Decreased strength;Decreased activity tolerance;Decreased balance;Decreased mobility       PT Treatment Interventions DME instruction;Gait training;Stair training;Functional mobility training;Therapeutic activities;Therapeutic exercise;Balance training;Patient/family education    PT Goals (Current goals can be found in the Care Plan section)  Acute Rehab PT Goals Patient Stated Goal: return home PT Goal  Formulation: With patient Time For Goal Achievement: 09/30/16 Potential to Achieve Goals: Good    Frequency Min 3X/week   Barriers to discharge        Co-evaluation               AM-PAC PT "6 Clicks" Daily Activity  Outcome Measure Difficulty turning over in bed (including adjusting bedclothes, sheets and blankets)?: Total Difficulty moving from lying on back to sitting on the side of the bed? : Total Difficulty sitting down on and standing up from a chair with arms (e.g., wheelchair, bedside commode, etc,.)?: Total Help needed moving to and from a bed to chair (including a wheelchair)?: A Lot Help needed walking in hospital room?: A Lot Help needed climbing 3-5 steps with a railing? : A Lot 6 Click Score: 9    End of Session Equipment Utilized During Treatment: Oxygen Activity Tolerance: Patient tolerated treatment well Patient left: in bed;with call bell/phone within reach;with bed alarm set Nurse Communication: Mobility status PT Visit Diagnosis: Unsteadiness on feet (R26.81);Muscle weakness (generalized) (M62.81);Difficulty in walking, not elsewhere classified (R26.2)    Time: 1505-6979 PT Time Calculation (min) (ACUTE ONLY): 19 min   Charges:   PT Evaluation $PT Eval Moderate Complexity: 1 Procedure     PT G CodesMarland Kitchen       [facility] [place] 09/23/2016, 4:12 PM

## 2016-09-23 NOTE — Progress Notes (Addendum)
PROGRESS NOTE    Brandi Leonard  XFG:[id number] DOB: 12-Aug-1965 DOA: 09/10/2016 PCP: No primary care provider on file.   Brief Narrative:  51 year old WF PMHx none on file   Admitted to [facility] 6/11 with Small bowel obstruction, underwent emergent laparotomy on 6/12 for perforation with peritonitis. Failed extubation postoperatively and then developed diffuse bilateral infiltrates consistent with ARDS and septic shock requiring Levophed.  Extubated 6/21.  On 6/25 developed worsening respiratory failure, ?aspiration v HCAP, large volume stool output.  PCCM reconsulted   Subjective: 6/28 A/O 4, negative SOB, negative CP, positive abdominal pain, negative N/V   Assessment & Plan:   Principal Problem:   Septic shock ([place]) Active Problems:   Encounter for central line placement   ARDS (adult respiratory distress syndrome) (HCC)   Diarrhea   Severe protein-calorie malnutrition (HCC)   Leukocytosis   Peritonitis (HCC)   Hypernatremia   Thrombocytosis (HCC)    Respiratory failure with hypoxia /Aspiration PNA/ ARDS (extubated 6/21) -Resolved -Titrate O2 to maintain SPO2> 93% -Patient currently on antibiotics for SBO with perforation would also cover aspiration pneumonia  COPD without acute exacerbation -Atrovent and Xopenex PRN   Cardiogenic Shock/[place] ischemia   -likely septic due to peritonitis - resolved.  -Patient currently asymptomatic -  Acute diastolic CHF -Strict I&O -Daily weight -Transfuse for hemoglobin<8  SBO with perforation/Septic Shock  -complicated by perforation now s/p repair 6/12; c/b post op abd pain, colitis and what appears to be intra-abd abscess by CT scan 6/25 -Cont wound vac per surgeons (MWF) -NGT to sxn; defer timing of using gut to surgical team  -S/p CT guided drainage of small bilateral abd abscesses 6/26 -Complete antibiotics per ID  Shock liver  -Trend LFTs  Severe protein cal malnutrition  Large volume stool output  -  CDiff neg   Anemia of critical illness.  -No evidence of acute bleeding  Recent Labs Lab 09/20/16 0329 09/21/16 0250 09/21/16 2135 09/22/16 0530 09/23/16 0430 09/24/16 0553  HGB 9.9* 8.9* 7.9* 7.7* 8.0* 9.1*  -Stable   Thrombocytosis  Trend CBC Transfuse per protocol Cont [name] heparin   Relative Adrenal insufficiency (cortisol 12 at [place])  -Intermittent hyperglycemia    ssi  Acute metabolic encephalopathy -Occasional jerking movements -resolved  Seizure - ?benzo withdrawal  Cont Clonazepam as ODT (on reduced dosing from home and doing well) PRN fent for pain  Watch for seizure  Hypokalemia -Potassium 60 mEq  Hypomagnesemia -Magnesium 2 g    DVT prophylaxis: Subcutaneous heparin Code Status: Full Family Communication: Family present at bedside Disposition Plan: Per surgery   Consultants:  PC CM CCS   Procedures/Significant Events:  6/11 admit for SBO 6/11 CT abdomen  > multiple dilated loops of fluid filled small bowel consistent with mechanical small bowel obstruction 6/12 perf to OR for repair 6/13 contnue shock, resp failure 6/15 transfer to [facility] for ICU admit. 6/16 Echo  >>LVEF= 50% to 55%.  -(grade 1 diastolic dysfunction).  - Pericardium, extracardiac: There is a large left pleural effusion.  6/24 CTA chest >>> Neg PE, L>R bilat effusions, LLL consolidation with air bronchograms, RML volume loss  BLE venous doppler 6/25>>> neg   6/25 CT Abd : Colon is diffusely dilated with fluid despite having a rectal tube. No significant small bowel dilatation.There are two fluid collections in the pelvis which were likely present prior to the recent surgery. The largest is in the left lower quadrant measuring over 7 cm and there is a smaller complex collection in the  right anterior pelvic region measuring 4.4 cm. These could be adnexal cystic structures but indeterminate. Ideally these areas could be further characterized with a pelvic ultrasound but may be  difficult with the fluid-filled colon. There are two new postoperative fluid collections along the lateral abdomen. These could represent pockets of loculated ascites or abscess collections. Trace left pleural fluid with consolidation or volume loss in the left lower lobe. Volume loss in the right lower lobe. 6/25- worsening resp status 6/26 IR drainage pus on left but NO drain required      VENTILATOR SETTINGS: None   Cultures Blood 6/12 > Coag neg staph 1/2 bottles BC x 2 6/15>>> NEG Sputum 6/15>> few candida CDiff 6/22>>> neg  BC x 2 6/24>>>  Surgical wound 6/26: rare GNR>>>  Antimicrobials: Anti-infectives    Start     Stop   09/21/16 2230  vancomycin (VANCOCIN) IVPB 1000 mg/200 mL premix  Status:  Discontinued     09/22/16 1102   09/21/16 1100  anidulafungin (ERAXIS) 100 mg in sodium chloride 0.9 % 100 mL IVPB         09/20/16 1100  anidulafungin (ERAXIS) 200 mg in sodium chloride 0.9 % 200 mL IVPB     09/20/16 1510   09/20/16 0145  vancomycin (VANCOCIN) IVPB 750 mg/150 ml premix  Status:  Discontinued     09/21/16 2210   09/19/16 0800  meropenem (MERREM) 1 g in sodium chloride 0.9 % 100 mL IVPB         09/11/16 1300  metroNIDAZOLE (FLAGYL) IVPB 500 mg  Status:  Discontinued     09/16/16 0912   09/11/16 1300  meropenem (MERREM) 1 g in sodium chloride 0.9 % 100 mL IVPB  Status:  Discontinued     09/16/16 0912       Devices    LINES / TUBES:  ETT 6/13 >6/21 R fem CVL 6/12 >6/15 Art line rt radial 6/15 > 6/21 IJ CVL 6/15>>>    Continuous Infusions: . anidulafungin Stopped (09/22/16 1149)  . dextrose 75 mL/hr at 09/23/16 0327  . meropenem (MERREM) IV Stopped (09/22/16 2350)  . potassium chloride 10 mEq (09/23/16 0644)     Objective: Vitals:   09/23/16 0400 09/23/16 0500 09/23/16 0600 09/23/16 0700  BP: 114/77 127/68 112/72 119/71  Pulse: 85 91 85 80  Resp: 19 17 16 15   Temp:      TempSrc:      SpO2: 99% 98% 100% 98%  Weight:  117 lb 8.1 oz  (53.3 kg)    Height:        Intake/Output Summary (Last 24 hours) at 09/23/16 0724 Last data filed at 09/23/16 0700  Gross per 24 hour  Intake          2568.75 ml  Output             2855 ml  Net          -286.25 ml   Filed Weights   09/21/16 0328 09/22/16 0500 09/23/16 0500  Weight: 107 lb 9.4 oz (48.8 kg) 116 lb 2.9 oz (52.7 kg) 117 lb 8.1 oz (53.3 kg)    Examination:  General: A/O 4 No acute respiratory distress, cachectic Eyes: negative scleral hemorrhage, negative anisocoria, negative icterus ENT: Negative Runny nose, negative gingival bleeding, Neck:  Negative scars, masses, torticollis, lymphadenopathy, JVD Lungs: Clear to auscultation bilaterally without wheezes or crackles Cardiovascular: Regular rate and rhythm without murmur gallop or rub normal S1 and S2 Abdomen: Positive abdominal pain  greatest LUQ/LLQ, nondistended, negative bowel sounds, no rebound, no ascites, no appreciable mass, wound VAC in place draining serosanguineous fluid Extremities: No significant cyanosis, clubbing, or edema bilateral lower extremities Skin: Negative rashes, lesions, ulcers Psychiatric:  Negative depression, negative anxiety, negative fatigue, negative mania  Central nervous system:  Cranial nerves II through XII intact, tongue/uvula midline, all extremities muscle strength 5/5, sensation intact throughout, negative dysarthria, negative expressive aphasia, negative receptive aphasia.  .     Data Reviewed: Care during the described time interval was provided by me .  I have reviewed this patient's available data, including medical history, events of note, physical examination, and all test results as part of my evaluation. I have personally reviewed and interpreted all radiology studies.  CBC:  Recent Labs Lab 09/20/16 0329 09/21/16 0250 09/21/16 2135 09/22/16 0530 09/23/16 0430  WBC 22.2* 24.3* 14.9* 14.7* 10.4  NEUTROABS 18.6* 20.6* 12.6* 12.4* 8.7*  HGB 9.9* 8.9* 7.9* 7.7*  8.0*  HCT 32.2* 28.9* 26.5* 26.0* 27.0*  MCV 81.7 80.5 82.3 83.1 82.3  PLT 1,021* 790* 633* 659* 414*   Basic Metabolic Panel:  Recent Labs Lab 09/17/16 0454 09/18/16 0430  09/19/16 0245 09/20/16 0329  09/21/16 0250  09/21/16 1853 09/21/16 2135 09/22/16 0530 09/22/16 1806 09/23/16 0430  NA 138 138  --  140 151*  < > 149*  < > 148* 147* 148* 144 141  K 3.1* 3.8  --  4.0 4.0  < > 2.5*  < > 4.0 3.9 3.8 3.2* 3.1*  CL 103 110  --  116* 129*  < > 114*  < > 111 113* 111 109 104  CO2 28 21*  --  17* 14*  < > 28  < > 30 29 30 29 30   GLUCOSE 164* 176*  --  145* 254*  < > 152*  < > 111* 115* 103* 108* 108*  BUN 22* 26*  --  27* 36*  < > 22*  < > 16 17 15 14 11   CREATININE 0.42* 0.42*  --  0.45 0.65  < > 0.45  < > 0.45 0.42* 0.45 0.34* 0.36*  CALCIUM 7.7* 7.9*  --  8.5* 8.3*  < > 7.8*  < > 7.5* 7.6* 7.9* 7.9* 7.9*  MG 1.7 1.7  < >  --  2.0  --  1.6*  --   --  2.3 2.1  --  1.8  PHOS 3.4 3.5  --  3.7  --   --  3.1  --   --   --  3.9  --   --   < > = values in this interval not displayed. GFR: Estimated Creatinine Clearance: 72.4 mL/min (A) (by C-G formula based on SCr of 0.36 mg/dL (L)). Liver Function Tests:  Recent Labs Lab 09/20/16 0329 09/21/16 0250 09/21/16 2135 09/22/16 0530 09/23/16 0430  AST 26 57* 41 28 17  ALT 53 84* 85* 71* 44  ALKPHOS 348* 252* 190* 175* 127*  BILITOT 0.6 0.3 0.3 0.1* 0.3  PROT 6.6 4.9* 4.8* 4.7* 4.5*  ALBUMIN 2.1* 1.8* 1.7* 1.6* 1.5*    Recent Labs Lab 09/20/16 0125  LIPASE 110*  AMYLASE 121*   No results for input(s): AMMONIA in the last 168 hours. Coagulation Profile: No results for input(s): INR, PROTIME in the last 168 hours. Cardiac Enzymes:  Recent Labs Lab 09/19/16 0219 09/19/16 1100 09/19/16 1608  TROPONINI 0.04* 0.05* 0.05*   BNP (last 3 results) No results for input(s): PROBNP in the last  8760 hours. HbA1C: No results for input(s): HGBA1C in the last 72 hours. CBG:  Recent Labs Lab 09/21/16 2112 09/22/16 0842  09/22/16 1220 09/22/16 1624 09/22/16 2335  GLUCAP 110* 105* 91 107* 110*   Lipid Profile: No results for input(s): CHOL, HDL, LDLCALC, TRIG, CHOLHDL, LDLDIRECT in the last 72 hours. Thyroid Function Tests: No results for input(s): TSH, T4TOTAL, FREET4, T3FREE, THYROIDAB in the last 72 hours. Anemia Panel:  Recent Labs  09/20/16 0914  FERRITIN 119   Urine analysis:    Component Value Date/Time   COLORURINE YELLOW 09/10/2016 1522   APPEARANCEUR CLEAR 09/10/2016 1522   APPEARANCEUR Cloudy 03/05/2012 1834   LABSPEC 1.016 09/10/2016 1522   LABSPEC 1.032 03/05/2012 1834   PHURINE 6.0 09/10/2016 1522   GLUCOSEU 150 (A) 09/10/2016 1522   GLUCOSEU Negative 03/05/2012 1834   HGBUR SMALL (A) 09/10/2016 1522   BILIRUBINUR NEGATIVE 09/10/2016 1522   BILIRUBINUR Negative 03/05/2012 1834   KETONESUR 5 (A) 09/10/2016 1522   PROTEINUR NEGATIVE 09/10/2016 1522   NITRITE NEGATIVE 09/10/2016 1522   LEUKOCYTESUR TRACE (A) 09/10/2016 1522   LEUKOCYTESUR Negative 03/05/2012 1834   Sepsis Labs: @LABRCNTIP (procalcitonin:4,lacticidven:4)  ) Recent Results (from the past 240 hour(s))  C difficile quick scan w PCR reflex     Status: None   Collection Time: 09/17/16  5:25 PM  Result Value Ref Range Status   C Diff antigen NEGATIVE NEGATIVE Final   C Diff toxin NEGATIVE NEGATIVE Final   C Diff interpretation No C. difficile detected.  Final  Culture, blood (Routine X 2) w Reflex to ID Panel     Status: None (Preliminary result)   Collection Time: 09/19/16  8:18 AM  Result Value Ref Range Status   Specimen Description BLOOD LEFT HAND  Final   Special Requests IN PEDIATRIC BOTTLE Blood Culture adequate volume  Final   Culture NO GROWTH 3 DAYS  Final   Report Status PENDING  Incomplete  Culture, blood (Routine X 2) w Reflex to ID Panel     Status: None (Preliminary result)   Collection Time: 09/19/16  8:20 AM  Result Value Ref Range Status   Specimen Description BLOOD RIGHT HAND  Final    Special Requests   Final    BOTTLES DRAWN AEROBIC AND ANAEROBIC Blood Culture adequate volume   Culture NO GROWTH 3 DAYS  Final   Report Status PENDING  Incomplete  Fungus culture, blood     Status: None (Preliminary result)   Collection Time: 09/20/16  3:00 AM  Result Value Ref Range Status   Specimen Description BLOOD BLOOD LEFT FOREARM  Final   Special Requests   Final    BOTTLES DRAWN AEROBIC ONLY Blood Culture adequate volume   Culture NO GROWTH 2 DAYS  Final   Report Status PENDING  Incomplete  Aerobic/Anaerobic Culture (surgical/deep wound)     Status: None (Preliminary result)   Collection Time: 09/21/16  4:15 PM  Result Value Ref Range Status   Specimen Description ABDOMEN LEFT  Final   Special Requests Normal  Final   Gram Stain   Final    ABUNDANT WBC PRESENT,BOTH PMN AND MONONUCLEAR RARE GRAM NEGATIVE RODS    Culture FEW GRAM NEGATIVE RODS  Final   Report Status PENDING  Incomplete  Aerobic/Anaerobic Culture (surgical/deep wound)     Status: None (Preliminary result)   Collection Time: 09/21/16  4:16 PM  Result Value Ref Range Status   Specimen Description ABDOMEN RIGHT  Final   Special Requests Normal  Final   Gram Stain   Final    ABUNDANT WBC PRESENT,BOTH PMN AND MONONUCLEAR NO ORGANISMS SEEN    Culture NO GROWTH < 24 HOURS  Final   Report Status PENDING  Incomplete         Radiology Studies: US Transvaginal Non-ob  Result Date: 09/21/2016 CLINICAL DATA:  51 year old with multiple intra-abdominal abscesses and free pelvic fluid on recent CT. EXAM: TRANSABDOMINAL AND TRANSVAGINAL ULTRASOUND OF PELVIS DOPPLER ULTRASOUND OF OVARIES TECHNIQUE: Both transabdominal and transvaginal ultrasound examinations of the pelvis were performed. Transabdominal technique was performed for global imaging of the pelvis including uterus, ovaries, adnexal regions, and pelvic cul-de-sac. It was necessary to proceed with endovaginal exam following the transabdominal exam to  visualize the adnexa. Color and duplex Doppler ultrasound was utilized to evaluate blood flow to the ovaries. COMPARISON:  CT abdomen and pelvis 09/20/2016, 09/06/2016. FINDINGS: Uterus Measurements: Approximately 7.5 x 6.0 x 6.2 cm. Likely submucosal fibroid measuring approximately 2.6 x 3.4 x 2.8 cm as the noted on CT. Endometrium Not visualized either transabdominally or transvaginally. Right ovary Measurements: Approximately 6.8 x 6.3 x 7.7 cm. Large simple cyst measuring approximately 6.7 x 5.6 x 7.2 cm. Normal color Doppler signal in the ovarian tissue. Left ovary Not visualized either transabdominally or transvaginally. Pulsed Doppler evaluation of the right ovary demonstrates normal low-resistance arterial and venous waveforms. Other findings Loculated fluid in the right adnexum. Loculated fluid versus hydrosalpinx involving the left adnexum. Semi-solid stool within the dilated rectum. IMPRESSION: 1. Approximate 7 cm cyst arising from the right ovary. 2. Approximate 3.4 cm uterine fibroid, likely submucosal in location. 3. Nonvisualization of the endometrium and left ovary. 4. Loculated fluid in both adnexa. The loculated fluid in the right adnexum has the appearance of an abscess. A loculated fluid in the left adnexum could be loculated fluid or a hydrosalpinx. Given the large right ovarian cyst and the inability to see the endometrium and left ovary on today's examination, a follow-up pelvic ultrasound is recommended in 6 or 10 weeks, after the patient has recovered from her acute condition. Electronically Signed   By: Evangeline Dakin M.D.   On: 09/21/2016 17:08   US Pelvis Complete  Result Date: 09/21/2016 CLINICAL DATA:  51 year old with multiple intra-abdominal abscesses and free pelvic fluid on recent CT. EXAM: TRANSABDOMINAL AND TRANSVAGINAL ULTRASOUND OF PELVIS DOPPLER ULTRASOUND OF OVARIES TECHNIQUE: Both transabdominal and transvaginal ultrasound examinations of the pelvis were performed.  Transabdominal technique was performed for global imaging of the pelvis including uterus, ovaries, adnexal regions, and pelvic cul-de-sac. It was necessary to proceed with endovaginal exam following the transabdominal exam to visualize the adnexa. Color and duplex Doppler ultrasound was utilized to evaluate blood flow to the ovaries. COMPARISON:  CT abdomen and pelvis 09/20/2016, 09/06/2016. FINDINGS: Uterus Measurements: Approximately 7.5 x 6.0 x 6.2 cm. Likely submucosal fibroid measuring approximately 2.6 x 3.4 x 2.8 cm as the noted on CT. Endometrium Not visualized either transabdominally or transvaginally. Right ovary Measurements: Approximately 6.8 x 6.3 x 7.7 cm. Large simple cyst measuring approximately 6.7 x 5.6 x 7.2 cm. Normal color Doppler signal in the ovarian tissue. Left ovary Not visualized either transabdominally or transvaginally. Pulsed Doppler evaluation of the right ovary demonstrates normal low-resistance arterial and venous waveforms. Other findings Loculated fluid in the right adnexum. Loculated fluid versus hydrosalpinx involving the left adnexum. Semi-solid stool within the dilated rectum. IMPRESSION: 1. Approximate 7 cm cyst arising from the right ovary. 2. Approximate 3.4 cm uterine fibroid, likely submucosal  in location. 3. Nonvisualization of the endometrium and left ovary. 4. Loculated fluid in both adnexa. The loculated fluid in the right adnexum has the appearance of an abscess. A loculated fluid in the left adnexum could be loculated fluid or a hydrosalpinx. Given the large right ovarian cyst and the inability to see the endometrium and left ovary on today's examination, a follow-up pelvic ultrasound is recommended in 6 or 10 weeks, after the patient has recovered from her acute condition. Electronically Signed   By: Evangeline Dakin M.D.   On: 09/21/2016 17:08   Ct Aspiration  Result Date: 09/21/2016 INDICATION: Postop small abdominal abscesses EXAM: CT GUIDED ASPIRATIONS OF  SMALL BILATERAL ABDOMINAL ABSCESSES MEDICATIONS: The patient is currently admitted to the hospital and receiving intravenous antibiotics. The antibiotics were administered within an appropriate time frame prior to the initiation of the procedure. ANESTHESIA/SEDATION: 1.0 mg IV Versed 50 mcg IV Fentanyl Moderate Sedation Time:  15 MINUTES The patient was continuously monitored during the procedure by the interventional radiology nurse under my direct supervision. COMPLICATIONS: None immediate. TECHNIQUE: Informed written consent was obtained from the [place] after a thorough discussion of the procedural risks, benefits and alternatives. All questions were addressed. Maximal Sterile Barrier Technique was utilized including caps, mask, sterile gowns, sterile gloves, sterile drape, hand hygiene and skin antiseptic. A timeout was performed prior to the initiation of the procedure. PROCEDURE: Previous imaging reviewed. Patient positioned supine. Noncontrast localization CT performed. The small bilateral pericolic gutter fluid collections by CT were localized. Overlying skin was marked bilaterally. Left lateral abdominal abscess aspiration: Under sterile conditions and local anesthesia, an 18 gauge trocar needle was advanced from a lateral approach into the small left lateral pericolic gutter abscess. Syringe aspiration yielded 11 cc purulent fluid. This nearly collapsed the small abscess. Sample sent for culture. No immediate complication. Right lateral abdominal abscess aspiration: In a similar fashion, under sterile conditions and local anesthesia, a second 18 gauge needle was advanced from a lateral approach into the small right abdominal abscess. Syringe aspiration yielded only a few cc of thin serosanguineous fluid. Sample sent for culture. Needle removed. Patient tolerated the procedure well. No immediate complication. FINDINGS: CT imaging confirms percutaneous needle access of the bilateral pericolic  gutter small fluid collections for needle aspiration IMPRESSION: Successful CT-guided bilateral aspirations of the small abdominal abscesses as above. Electronically Signed   By: Jerilynn Mages.  Shick M.D.   On: 09/21/2016 16:28   Ct Aspiration  Result Date: 09/21/2016 INDICATION: Postop small abdominal abscesses EXAM: CT GUIDED ASPIRATIONS OF SMALL BILATERAL ABDOMINAL ABSCESSES MEDICATIONS: The patient is currently admitted to the hospital and receiving intravenous antibiotics. The antibiotics were administered within an appropriate time frame prior to the initiation of the procedure. ANESTHESIA/SEDATION: 1.0 mg IV Versed 50 mcg IV Fentanyl Moderate Sedation Time:  15 MINUTES The patient was continuously monitored during the procedure by the interventional radiology nurse under my direct supervision. COMPLICATIONS: None immediate. TECHNIQUE: Informed written consent was obtained from the [place] after a thorough discussion of the procedural risks, benefits and alternatives. All questions were addressed. Maximal Sterile Barrier Technique was utilized including caps, mask, sterile gowns, sterile gloves, sterile drape, hand hygiene and skin antiseptic. A timeout was performed prior to the initiation of the procedure. PROCEDURE: Previous imaging reviewed. Patient positioned supine. Noncontrast localization CT performed. The small bilateral pericolic gutter fluid collections by CT were localized. Overlying skin was marked bilaterally. Left lateral abdominal abscess aspiration: Under sterile conditions and local anesthesia, an 18  gauge trocar needle was advanced from a lateral approach into the small left lateral pericolic gutter abscess. Syringe aspiration yielded 11 cc purulent fluid. This nearly collapsed the small abscess. Sample sent for culture. No immediate complication. Right lateral abdominal abscess aspiration: In a similar fashion, under sterile conditions and local anesthesia, a second 18 gauge needle was  advanced from a lateral approach into the small right abdominal abscess. Syringe aspiration yielded only a few cc of thin serosanguineous fluid. Sample sent for culture. Needle removed. Patient tolerated the procedure well. No immediate complication. FINDINGS: CT imaging confirms percutaneous needle access of the bilateral pericolic gutter small fluid collections for needle aspiration IMPRESSION: Successful CT-guided bilateral aspirations of the small abdominal abscesses as above. Electronically Signed   By: Jerilynn Mages.  Shick M.D.   On: 09/21/2016 16:28   [place] Art/ven Flow Abd Pelv Doppler  Result Date: 09/21/2016 CLINICAL DATA:  51 year old with multiple intra-abdominal abscesses and free pelvic fluid on recent CT. EXAM: TRANSABDOMINAL AND TRANSVAGINAL ULTRASOUND OF PELVIS DOPPLER ULTRASOUND OF OVARIES TECHNIQUE: Both transabdominal and transvaginal ultrasound examinations of the pelvis were performed. Transabdominal technique was performed for global imaging of the pelvis including uterus, ovaries, adnexal regions, and pelvic cul-de-sac. It was necessary to proceed with endovaginal exam following the transabdominal exam to visualize the adnexa. Color and duplex Doppler ultrasound was utilized to evaluate blood flow to the ovaries. COMPARISON:  CT abdomen and pelvis 09/20/2016, 09/06/2016. FINDINGS: Uterus Measurements: Approximately 7.5 x 6.0 x 6.2 cm. Likely submucosal fibroid measuring approximately 2.6 x 3.4 x 2.8 cm as the noted on CT. Endometrium Not visualized either transabdominally or transvaginally. Right ovary Measurements: Approximately 6.8 x 6.3 x 7.7 cm. Large simple cyst measuring approximately 6.7 x 5.6 x 7.2 cm. Normal color Doppler signal in the ovarian tissue. Left ovary Not visualized either transabdominally or transvaginally. Pulsed Doppler evaluation of the right ovary demonstrates normal low-resistance arterial and venous waveforms. Other findings Loculated fluid in the right adnexum. Loculated  fluid versus hydrosalpinx involving the left adnexum. Semi-solid stool within the dilated rectum. IMPRESSION: 1. Approximate 7 cm cyst arising from the right ovary. 2. Approximate 3.4 cm uterine fibroid, likely submucosal in location. 3. Nonvisualization of the endometrium and left ovary. 4. Loculated fluid in both adnexa. The loculated fluid in the right adnexum has the appearance of an abscess. A loculated fluid in the left adnexum could be loculated fluid or a hydrosalpinx. Given the large right ovarian cyst and the inability to see the endometrium and left ovary on today's examination, a follow-up pelvic ultrasound is recommended in 6 or 10 weeks, after the patient has recovered from her acute condition. Electronically Signed   By: Evangeline Dakin M.D.   On: 09/21/2016 17:08        Scheduled Meds: . aspirin  162 mg Oral Daily  . chlorhexidine  15 mL Mouth Rinse BID  . Chlorhexidine Gluconate Cloth  6 each Topical Daily  . clonazepam  0.5 mg Per Tube BID  . heparin  5,000 Units Subcutaneous Q8H  . insulin aspart  0-15 Units Subcutaneous TID WC  . ipratropium  0.5 mg Nebulization Q6H  . levalbuterol  0.63 mg Nebulization Q6H  . mouth rinse  15 mL Mouth Rinse BID  . mouth rinse  15 mL Mouth Rinse q12n4p  . pantoprazole sodium  40 mg Per Tube Daily  . sodium chloride flush  10-40 mL Intracatheter Q12H   Continuous Infusions: . anidulafungin Stopped (09/22/16 1149)  . dextrose 75 mL/hr at  09/23/16 0327  . meropenem (MERREM) IV Stopped (09/22/16 2350)  . potassium chloride 10 mEq (09/23/16 0644)     LOS: 13 days    Time spent: 40 minutes    Baby Gieger, Geraldo Docker, MD [facility] Pager [phone]   If 7PM-7AM, please contact night-coverage [email or web address] Password [facility] 09/23/2016, 7:24 AM

## 2016-09-23 NOTE — Progress Notes (Signed)
SLP Cancellation Note  Patient Details Name: AHRIA SLAPPEY MRN: [id number] DOB: 1965/10/20   Cancelled treatment:       Reason Eval/Treat Not Completed: Medical issues which prohibited therapy. Per MD notes, continue ng tube to suction today.  [place], CCC-SLP [phone]    Antha Niday Meryl 09/23/2016, 8:07 AM

## 2016-09-24 DIAGNOSIS — R569 Unspecified convulsions: Secondary | ICD-10-CM

## 2016-09-24 DIAGNOSIS — D473 Essential (hemorrhagic) thrombocythemia: Secondary | ICD-10-CM

## 2016-09-24 DIAGNOSIS — I5031 Acute diastolic (congestive) heart failure: Secondary | ICD-10-CM

## 2016-09-24 DIAGNOSIS — G9341 Metabolic encephalopathy: Secondary | ICD-10-CM

## 2016-09-24 LAB — CULTURE, BLOOD (ROUTINE X 2)
CULTURE: NO GROWTH
Culture: NO GROWTH
Special Requests: ADEQUATE
Special Requests: ADEQUATE

## 2016-09-24 LAB — GLUCOSE, CAPILLARY
GLUCOSE-CAPILLARY: 111 mg/dL — AB (ref 65–99)
GLUCOSE-CAPILLARY: 103 mg/dL — AB (ref 65–99)
Glucose-Capillary: 99 mg/dL (ref 65–99)
Glucose-Capillary: 92 mg/dL (ref 65–99)
Glucose-Capillary: 110 mg/dL — ABNORMAL HIGH (ref 65–99)

## 2016-09-24 LAB — BASIC METABOLIC PANEL
ANION GAP: 8 (ref 5–15)
BUN: 6 mg/dL (ref 6–20)
CHLORIDE: 101 mmol/L (ref 101–111)
CO2: 28 mmol/L (ref 22–32)
Calcium: 8.1 mg/dL — ABNORMAL LOW (ref 8.9–10.3)
Creatinine, Ser: 0.38 mg/dL — ABNORMAL LOW (ref 0.44–1.00)
GFR calc non Af Amer: >60 mL/min (ref >60)
GLUCOSE: 114 mg/dL — AB (ref 65–99)
POTASSIUM: 3.5 mmol/L (ref 3.5–5.1)
Sodium: 137 mmol/L (ref 135–145)

## 2016-09-24 LAB — MAGNESIUM: MAGNESIUM: 1.9 mg/dL (ref 1.7–2.4)

## 2016-09-24 LAB — CBC WITH DIFFERENTIAL/PLATELET
BASOS ABS: 0 10*3/uL (ref 0.0–0.1)
BASOS PCT: 0 %
Eosinophils Absolute: 0.1 10*3/uL (ref 0.0–0.7)
Eosinophils Relative: 1 %
HEMATOCRIT: 30.1 % — AB (ref 36.0–46.0)
HEMOGLOBIN: 9.1 g/dL — AB (ref 12.0–15.0)
LYMPHS PCT: 10 %
Lymphs Abs: 1.1 10*3/uL (ref 0.7–4.0)
MCH: 25 pg — ABNORMAL LOW (ref 26.0–34.0)
MCHC: 30.2 g/dL (ref 30.0–36.0)
MCV: 82.7 fL (ref 78.0–100.0)
Monocytes Absolute: 0.4 10*3/uL (ref 0.1–1.0)
Monocytes Relative: 4 %
NEUTROS ABS: 9 10*3/uL — AB (ref 1.7–7.7)
Neutrophils Relative %: 85 %
Platelets: 624 10*3/uL — ABNORMAL HIGH (ref 150–400)
RBC: 3.64 MIL/uL — ABNORMAL LOW (ref 3.87–5.11)
RDW: 17.9 % — ABNORMAL HIGH (ref 11.5–15.5)
WBC: 10.6 10*3/uL — ABNORMAL HIGH (ref 4.0–10.5)

## 2016-09-24 LAB — PHOSPHORUS: Phosphorus: 3.5 mg/dL (ref 2.5–4.6)

## 2016-09-24 MED ORDER — MORPHINE SULFATE (PF) 4 MG/ML IV SOLN
2.0000 mg | INTRAVENOUS | Status: DC | PRN
  Administered 2016-09-24 – 2016-09-25 (×2): 4 mg via INTRAVENOUS
  Administered 2016-09-25: 2 mg via INTRAVENOUS
  Administered 2016-09-25 – 2016-09-29 (×10): 4 mg via INTRAVENOUS
  Filled 2016-09-24 (×13): qty 1

## 2016-09-24 NOTE — Progress Notes (Signed)
PROGRESS NOTE    Brandi Leonard  JGG:[id number] DOB: 19-Aug-1965 DOA: 09/10/2016 PCP: No primary care provider on file.   Brief Narrative:  51 year old WF PMHx none on file   Admitted to [facility] 6/11 with Small bowel obstruction, underwent emergent laparotomy on 6/12 for perforation with peritonitis. Failed extubation postoperatively and then developed diffuse bilateral infiltrates consistent with ARDS and septic shock requiring Levophed.  Extubated 6/21.  On 6/25 developed worsening respiratory failure, ?aspiration v HCAP, large volume stool output.  PCCM reconsulted   Subjective: 6/29 A/O 4, negative SOB, negative CP, positive abdominal pain, negative N/V ambulating around ward.   Assessment & Plan:   Principal Problem:   Septic shock ([place]) Active Problems:   Encounter for central line placement   ARDS (adult respiratory distress syndrome) (HCC)   Diarrhea   Severe protein-calorie malnutrition (HCC)   Leukocytosis   Peritonitis (HCC)   Hypernatremia   Thrombocytosis (HCC)    Respiratory failure with hypoxia /Aspiration PNA/ ARDS (extubated 6/21) -Resolved -Titrate O2 to maintain SPO2> 93% -Patient currently on antibiotics for SBO with perforation would also cover aspiration pneumonia  COPD without acute exacerbation -Atrovent and Xopenex PRN   Cardiogenic Shock/Demand ischemia   -likely septic due to peritonitis - resolved.  -Patient currently asymptomatic  Acute diastolic CHF -Strict I&O since admission -9.1 L -Daily weight Filed Weights   09/22/16 0500 09/23/16 0500 09/24/16 0503  Weight: 116 lb 2.9 oz (52.7 kg) 117 lb 8.1 oz (53.3 kg) 114 lb 3.2 oz (51.8 kg)  -Transfuse for hemoglobin<8  SBO with perforation/Septic Shock  -complicated by perforation now s/p repair 6/12; c/b post op abd pain, colitis and what appears to be intra-abd abscess by CT scan 6/25 -Cont wound vac per surgeons (MWF) -NGT to sxn; defer timing of using gut to surgical team  -S/p  CT guided drainage of small bilateral abd abscesses 6/26 -Complete antibiotics per surgery.  Shock liver  -Trend LFTs  Severe protein cal malnutrition -Currently patient  NPO secondary to ileus from SBO. Patient to remain NPO until cleared by surgery. If patient's ileus does not resolve in next 24-48 hours will need to consider TPN  Large volume stool output  - C.Diff neg   Anemia of critical illness.  -No evidence of acute bleeding Recent Labs Lab 09/20/16 0329 09/21/16 0250 09/21/16 2135 09/22/16 0530 09/23/16 0430 09/24/16 0553  HGB 9.9* 8.9* 7.9* 7.7* 8.0* 9.1*  -Stable   Thrombocytosis  -most likely reactive, stable   Relative Adrenal insufficiency (cortisol 12 at [place])  -Resolved -Continue moderate SSI  Acute metabolic encephalopathy -Occasional jerking movements  -resolved  Seizure - ?benzo withdrawal  -Cont Clonazepam as ODT (on reduced dosing from home and doing well) -Morphine PRN   -Watch for seizure  Hypokalemia -Resolved  Hypomagnesemia -Resolved    DVT prophylaxis: Subcutaneous heparin Code Status: Full Family Communication: Family present at bedside Disposition Plan: Per surgery   Consultants:  PC CM CCS   Procedures/Significant Events:  6/11 admit for SBO 6/11 CT abdomen  > multiple dilated loops of fluid filled small bowel consistent with mechanical small bowel obstruction 6/12 perf to OR for repair 6/13 contnue shock, resp failure 6/15 transfer to [facility] for ICU admit. 6/16 Echo  >>LVEF= 50% to 55%.  -(grade 1 diastolic dysfunction).  - Pericardium, extracardiac: There is a large left pleural effusion.  6/24 CTA chest >>> Neg PE, L>R bilat effusions, LLL consolidation with air bronchograms, RML volume loss  BLE venous doppler 6/25>>> neg  6/25 CT Abd : Colon is diffusely dilated with fluid despite having a rectal tube. No significant small bowel dilatation.There are two fluid collections in the pelvis which were likely  present prior to the recent surgery. The largest is in the left lower quadrant measuring over 7 cm and there is a smaller complex collection in the right anterior pelvic region measuring 4.4 cm. These could be adnexal cystic structures but indeterminate. Ideally these areas could be further characterized with a pelvic ultrasound but may be difficult with the fluid-filled colon. There are two new postoperative fluid collections along the lateral abdomen. These could represent pockets of loculated ascites or abscess collections. Trace left pleural fluid with consolidation or volume loss in the left lower lobe. Volume loss in the right lower lobe. 6/25- worsening resp status 6/26 IR drainage pus on left but NO drain required      VENTILATOR SETTINGS: None   Cultures Blood 6/12 > Coag neg staph 1/2 bottles BC x 2 6/15>>> NEG Sputum 6/15>> few candida CDiff 6/22>>> neg  BC x 2 6/24>>>  Surgical wound 6/26: rare GNR>>>   Antimicrobials: Anti-infectives    Start     Stop   09/21/16 2230  vancomycin (VANCOCIN) IVPB 1000 mg/200 mL premix  Status:  Discontinued     09/22/16 1102   09/21/16 1100  anidulafungin (ERAXIS) 100 mg in sodium chloride 0.9 % 100 mL IVPB         09/20/16 1100  anidulafungin (ERAXIS) 200 mg in sodium chloride 0.9 % 200 mL IVPB     09/20/16 1510   09/20/16 0145  vancomycin (VANCOCIN) IVPB 750 mg/150 ml premix  Status:  Discontinued     09/21/16 2210   09/19/16 0800  meropenem (MERREM) 1 g in sodium chloride 0.9 % 100 mL IVPB         09/11/16 1300  metroNIDAZOLE (FLAGYL) IVPB 500 mg  Status:  Discontinued     09/16/16 0912   09/11/16 1300  meropenem (MERREM) 1 g in sodium chloride 0.9 % 100 mL IVPB  Status:  Discontinued     09/16/16 0912       Devices    LINES / TUBES:  ETT 6/13 >6/21 R fem CVL 6/12 >6/15 Art line rt radial 6/15 > 6/21 IJ CVL 6/15>>>    Continuous Infusions: . anidulafungin Stopped (09/23/16 1352)  . dextrose 75 mL/hr at 09/24/16  0439  . meropenem (MERREM) IV Stopped (09/24/16 0258)     Objective: Vitals:   09/24/16 0503 09/24/16 0600 09/24/16 0602 09/24/16 0700  BP:   112/67 124/82  Pulse:  (!) 105 (!) 104 (!) 102  Resp:  18 18 18   Temp: 98.8 F (37.1 C)     TempSrc:      SpO2:  96% 93% 100%  Weight: 114 lb 3.2 oz (51.8 kg)     Height:        Intake/Output Summary (Last 24 hours) at 09/24/16 0837 Last data filed at 09/24/16 0800  Gross per 24 hour  Intake             2190 ml  Output             3900 ml  Net            -1710 ml   Filed Weights   09/22/16 0500 09/23/16 0500 09/24/16 0503  Weight: 116 lb 2.9 oz (52.7 kg) 117 lb 8.1 oz (53.3 kg) 114 lb 3.2 oz (51.8 kg)  Examination:  General: A/O 4 No acute respiratory distress, cachectic Eyes: negative scleral hemorrhage, negative anisocoria, negative icterus ENT: Negative Runny nose, negative gingival bleeding, Neck:  Negative scars, masses, torticollis, lymphadenopathy, JVD Lungs: Clear to auscultation bilaterally without wheezes or crackles Cardiovascular: Regular rate and rhythm without murmur gallop or rub normal S1 and S2 Abdomen: Positive abdominal pain greatest LUQ/LLQ, nondistended, negative bowel sounds, no rebound, no ascites, no appreciable mass, wound VAC in place draining serosanguineous fluid Extremities: No significant cyanosis, clubbing, or edema bilateral lower extremities Skin: Negative rashes, lesions, ulcers Psychiatric:  Negative depression, negative anxiety, negative fatigue, negative mania  Central nervous system:  Cranial nerves II through XII intact, tongue/uvula midline, all extremities muscle strength 5/5, sensation intact throughout, negative dysarthria, negative expressive aphasia, negative receptive aphasia.  .     Data Reviewed: Care during the described time interval was provided by me .  I have reviewed this patient's available data, including medical history, events of note, physical examination, and all  test results as part of my evaluation. I have personally reviewed and interpreted all radiology studies.  CBC:  Recent Labs Lab 09/21/16 0250 09/21/16 2135 09/22/16 0530 09/23/16 0430 09/24/16 0553  WBC 24.3* 14.9* 14.7* 10.4 10.6*  NEUTROABS 20.6* 12.6* 12.4* 8.7* 9.0*  HGB 8.9* 7.9* 7.7* 8.0* 9.1*  HCT 28.9* 26.5* 26.0* 27.0* 30.1*  MCV 80.5 82.3 83.1 82.3 82.7  PLT 790* 633* 659* 624* 124*   Basic Metabolic Panel:  Recent Labs Lab 09/18/16 0430  09/19/16 0245  09/21/16 0250  09/21/16 2135 09/22/16 0530 09/22/16 1806 09/23/16 0430 09/24/16 0553  NA 138  --  140  < > 149*  < > 147* 148* 144 141 137  K 3.8  --  4.0  < > 2.5*  < > 3.9 3.8 3.2* 3.1* 3.5  CL 110  --  116*  < > 114*  < > 113* 111 109 104 101  CO2 21*  --  17*  < > 28  < > 29 30 29 30 28   GLUCOSE 176*  --  145*  < > 152*  < > 115* 103* 108* 108* 114*  BUN 26*  --  27*  < > 22*  < > 17 15 14 11 6   CREATININE 0.42*  --  0.45  < > 0.45  < > 0.42* 0.45 0.34* 0.36* 0.38*  CALCIUM 7.9*  --  8.5*  < > 7.8*  < > 7.6* 7.9* 7.9* 7.9* 8.1*  MG 1.7  < >  --   < > 1.6*  --  2.3 2.1  --  1.8 1.9  PHOS 3.5  --  3.7  --  3.1  --   --  3.9  --   --  3.5  < > = values in this interval not displayed. GFR: Estimated Creatinine Clearance: 70.3 mL/min (A) (by C-G formula based on SCr of 0.38 mg/dL (L)). Liver Function Tests:  Recent Labs Lab 09/20/16 0329 09/21/16 0250 09/21/16 2135 09/22/16 0530 09/23/16 0430  AST 26 57* 41 28 17  ALT 53 84* 85* 71* 44  ALKPHOS 348* 252* 190* 175* 127*  BILITOT 0.6 0.3 0.3 0.1* 0.3  PROT 6.6 4.9* 4.8* 4.7* 4.5*  ALBUMIN 2.1* 1.8* 1.7* 1.6* 1.5*    Recent Labs Lab 09/20/16 0125  LIPASE 110*  AMYLASE 121*   No results for input(s): AMMONIA in the last 168 hours. Coagulation Profile: No results for input(s): INR, PROTIME in the last 168 hours. Cardiac  Enzymes:  Recent Labs Lab 09/19/16 0219 09/19/16 1100 09/19/16 1608  TROPONINI 0.04* 0.05* 0.05*   BNP (last 3  results) No results for input(s): PROBNP in the last 8760 hours. HbA1C: No results for input(s): HGBA1C in the last 72 hours. CBG:  Recent Labs Lab 09/23/16 1223 09/23/16 1556 09/23/16 1754 09/23/16 2210 09/24/16 0831  GLUCAP 103* 103* 95 102* 111*   Lipid Profile: No results for input(s): CHOL, HDL, LDLCALC, TRIG, CHOLHDL, LDLDIRECT in the last 72 hours. Thyroid Function Tests: No results for input(s): TSH, T4TOTAL, FREET4, T3FREE, THYROIDAB in the last 72 hours. Anemia Panel: No results for input(s): VITAMINB12, FOLATE, FERRITIN, TIBC, IRON, RETICCTPCT in the last 72 hours. Urine analysis:    Component Value Date/Time   COLORURINE YELLOW 09/10/2016 1522   APPEARANCEUR CLEAR 09/10/2016 1522   APPEARANCEUR Cloudy 03/05/2012 1834   LABSPEC 1.016 09/10/2016 1522   LABSPEC 1.032 03/05/2012 1834   PHURINE 6.0 09/10/2016 1522   GLUCOSEU 150 (A) 09/10/2016 1522   GLUCOSEU Negative 03/05/2012 1834   HGBUR SMALL (A) 09/10/2016 1522   BILIRUBINUR NEGATIVE 09/10/2016 1522   BILIRUBINUR Negative 03/05/2012 1834   KETONESUR 5 (A) 09/10/2016 1522   PROTEINUR NEGATIVE 09/10/2016 1522   NITRITE NEGATIVE 09/10/2016 1522   LEUKOCYTESUR TRACE (A) 09/10/2016 1522   LEUKOCYTESUR Negative 03/05/2012 1834   Sepsis Labs: @LABRCNTIP (procalcitonin:4,lacticidven:4)  ) Recent Results (from the past 240 hour(s))  C difficile quick scan w PCR reflex     Status: None   Collection Time: 09/17/16  5:25 PM  Result Value Ref Range Status   C Diff antigen NEGATIVE NEGATIVE Final   C Diff toxin NEGATIVE NEGATIVE Final   C Diff interpretation No C. difficile detected.  Final  Culture, blood (Routine X 2) w Reflex to ID Panel     Status: None (Preliminary result)   Collection Time: 09/19/16  8:18 AM  Result Value Ref Range Status   Specimen Description BLOOD LEFT HAND  Final   Special Requests IN PEDIATRIC BOTTLE Blood Culture adequate volume  Final   Culture NO GROWTH 4 DAYS  Final   Report  Status PENDING  Incomplete  Culture, blood (Routine X 2) w Reflex to ID Panel     Status: None (Preliminary result)   Collection Time: 09/19/16  8:20 AM  Result Value Ref Range Status   Specimen Description BLOOD RIGHT HAND  Final   Special Requests   Final    BOTTLES DRAWN AEROBIC AND ANAEROBIC Blood Culture adequate volume   Culture NO GROWTH 4 DAYS  Final   Report Status PENDING  Incomplete  Fungus culture, blood     Status: None (Preliminary result)   Collection Time: 09/20/16  3:00 AM  Result Value Ref Range Status   Specimen Description BLOOD BLOOD LEFT FOREARM  Final   Special Requests   Final    BOTTLES DRAWN AEROBIC ONLY Blood Culture adequate volume   Culture NO GROWTH 3 DAYS  Final   Report Status PENDING  Incomplete  Aerobic/Anaerobic Culture (surgical/deep wound)     Status: None (Preliminary result)   Collection Time: 09/21/16  4:15 PM  Result Value Ref Range Status   Specimen Description ABDOMEN LEFT  Final   Special Requests Normal  Final   Gram Stain   Final    ABUNDANT WBC PRESENT,BOTH PMN AND MONONUCLEAR RARE GRAM NEGATIVE RODS    Culture   Final    FEW ESCHERICHIA COLI NO ANAEROBES ISOLATED; CULTURE IN PROGRESS FOR 5 DAYS  Report Status PENDING  Incomplete   Organism ID, Bacteria ESCHERICHIA COLI  Final      Susceptibility   Escherichia coli - MIC*    AMPICILLIN 8 SENSITIVE Sensitive     CEFAZOLIN <=4 SENSITIVE Sensitive     CEFEPIME <=1 SENSITIVE Sensitive     CEFTAZIDIME <=1 SENSITIVE Sensitive     CEFTRIAXONE <=1 SENSITIVE Sensitive     CIPROFLOXACIN >=4 RESISTANT Resistant     GENTAMICIN <=1 SENSITIVE Sensitive     IMIPENEM <=0.25 SENSITIVE Sensitive     TRIMETH/SULFA <=20 SENSITIVE Sensitive     AMPICILLIN/SULBACTAM 4 SENSITIVE Sensitive     PIP/TAZO <=4 SENSITIVE Sensitive     Extended ESBL NEGATIVE Sensitive     * FEW ESCHERICHIA COLI  Aerobic/Anaerobic Culture (surgical/deep wound)     Status: None (Preliminary result)   Collection Time:  09/21/16  4:16 PM  Result Value Ref Range Status   Specimen Description ABDOMEN RIGHT  Final   Special Requests Normal  Final   Gram Stain   Final    ABUNDANT WBC PRESENT,BOTH PMN AND MONONUCLEAR NO ORGANISMS SEEN    Culture NO GROWTH 2 DAYS  Final   Report Status PENDING  Incomplete         Radiology Studies: Dg Abd Portable 1v  Result Date: 09/23/2016 CLINICAL DATA:  Postop ileus, abdominal pain EXAM: PORTABLE ABDOMEN - 1 VIEW COMPARISON:  09/21/2016, 09/20/2016 FINDINGS: NG tube tip within the distal stomach. Mild gaseous distention of the distal small bowel and the colon. Air is present in the rectum. Rectal Foley noted. Bowel gas pattern consistent with resolving ileus. No acute osseous finding or abnormal calcification. IMPRESSION: Resolving postoperative ileus. Electronically Signed   By: Jerilynn Mages.  Shick M.D.   On: 09/23/2016 07:59        Scheduled Meds: . aspirin  162 mg Oral Daily  . chlorhexidine  15 mL Mouth Rinse BID  . Chlorhexidine Gluconate Cloth  6 each Topical Daily  . clonazepam  0.5 mg Per Tube BID  . heparin  5,000 Units Subcutaneous Q8H  . insulin aspart  0-15 Units Subcutaneous TID WC  . mouth rinse  15 mL Mouth Rinse BID  . mouth rinse  15 mL Mouth Rinse q12n4p  . pantoprazole sodium  40 mg Per Tube Daily  . sodium chloride flush  10-40 mL Intracatheter Q12H   Continuous Infusions: . anidulafungin Stopped (09/23/16 1352)  . dextrose 75 mL/hr at 09/24/16 0439  . meropenem (MERREM) IV Stopped (09/24/16 0838)     LOS: 14 days    Time spent: 40 minutes    WOODS, Geraldo Docker, MD [facility] Pager [phone]   If 7PM-7AM, please contact night-coverage [email or web address] Password [facility] 09/24/2016, 8:37 AM

## 2016-09-24 NOTE — Progress Notes (Signed)
SLP Cancellation Note  Patient Details Name: Brandi Leonard MRN: [id number] DOB: 07-18-1965   Cancelled treatment:       Reason Eval/Treat Not Completed: Medical issues which prohibited therapy. MD notes indicated clamping NG today and working on diet tomorrow.  Pt will need SLP swallow re-evaluation prior to resumption of PO diet.  Will f/u next date for readiness per MD.    [name] 09/24/2016, 9:42 AM

## 2016-09-24 NOTE — Progress Notes (Signed)
Physical Therapy Treatment Patient Details Name: Brandi Leonard MRN: [id number] DOB: Mar 16, 1966 Today's Date: 09/24/2016    History of Present Illness Pt adm to [facility] with SBO and perforation/peritonitis and underwent repair on 6/12. Failed extubation post-op and developed ARDS. Transferred to [facility] on 6/15. Extubated 6/21. Pt underwent drainage of abd abcesses on 6/26.  PMH - copd, sbo, anxiety, cholecystitis    PT Comments    Pt making good progress.   Follow Up Recommendations  No PT follow up     Equipment Recommendations  Other (comment) (To be assessed)    Recommendations for Other Services       Precautions / Restrictions Precautions Precautions: Fall Restrictions Weight Bearing Restrictions: No    Mobility  Bed Mobility Overal bed mobility: Needs Assistance Bed Mobility: Sidelying to Sit;Rolling Rolling: Min guard Sidelying to sit: Min assist       General bed mobility comments: Assist to bring legs off of bed and to elevate trunk into sitting  Transfers Overall transfer level: Needs assistance Equipment used: Rolling walker (2 wheeled) Transfers: Sit to/from Stand Sit to Stand: Min assist;+2 safety/equipment         General transfer comment: Assist to bring hips up and for balance.  Ambulation/Gait Ambulation/Gait assistance: Min assist;+2 safety/equipment Ambulation Distance (Feet): 170 Feet Assistive device: Rolling walker (2 wheeled) Gait Pattern/deviations: Step-through pattern;Decreased step length - right;Decreased step length - left;Drifts right/left Gait velocity: decr Gait velocity interpretation: Below normal speed for age/gender General Gait Details: Pt with unsteady gait. Assist for balance and support. pt amb on RA with SpO2 >91%.   Stairs            Wheelchair Mobility    Modified Rankin (Stroke Patients Only)       Balance Overall balance assessment: Needs assistance Sitting-balance support: No upper  extremity supported;Feet supported Sitting balance-Leahy Scale: Fair     Standing balance support: Single extremity supported Standing balance-Leahy Scale: Poor Standing balance comment: UE support and min assist for static standing                            Cognition Arousal/Alertness: Awake/alert Behavior During Therapy: WFL for tasks assessed/performed Overall Cognitive Status: Within Functional Limits for tasks assessed                                        Exercises      General Comments        Pertinent Vitals/Pain Pain Assessment: Faces Faces Pain Scale: Hurts little more Pain Location: abdomen Pain Descriptors / Indicators: Grimacing;Guarding Pain Intervention(s): Limited activity within patient's tolerance    Home Living                      Prior Function            PT Goals (current goals can now be found in the care plan section) Progress towards PT goals: Progressing toward goals    Frequency    Min 3X/week      PT Plan Current plan remains appropriate    Co-evaluation              AM-PAC PT "6 Clicks" Daily Activity  Outcome Measure  Difficulty turning over in bed (including adjusting bedclothes, sheets and blankets)?: A Little Difficulty moving from lying on back to sitting  on the side of the bed? : Total Difficulty sitting down on and standing up from a chair with arms (e.g., wheelchair, bedside commode, etc,.)?: Total Help needed moving to and from a bed to chair (including a wheelchair)?: A Little Help needed walking in hospital room?: A Little Help needed climbing 3-5 steps with a railing? : A Lot 6 Click Score: 13    End of Session   Activity Tolerance: Patient tolerated treatment well Patient left: with call bell/phone within reach;in chair;with chair alarm set Nurse Communication: Mobility status PT Visit Diagnosis: Unsteadiness on feet (R26.81);Muscle weakness (generalized)  (M62.81);Difficulty in walking, not elsewhere classified (R26.2)     Time: 0912-0930 PT Time Calculation (min) (ACUTE ONLY): 18 min  Charges:  $Gait Training: 8-22 mins                    G Codes:       [facility] 09/24/2016, 11:43 AM

## 2016-09-24 NOTE — Progress Notes (Addendum)
Subjective/Chief Complaint: Rectal tube in place, thinks she has flatus, some loose stool in tube, no n/v, taking some ice around tube, feels ok this am   Objective: Vital signs in last 24 hours: Temp:  [98.2 F (36.8 C)-98.8 F (37.1 C)] 98.8 F (37.1 C) (06/29 0503) Pulse Rate:  [78-109] 102 (06/29 0700) Resp:  [13-22] 18 (06/29 0700) BP: (98-127)/(65-82) 124/82 (06/29 0700) SpO2:  [87 %-100 %] 100 % (06/29 0700) FiO2 (%):  [2 %] 2 % (06/28 0800) Weight:  [51.8 kg (114 lb 3.2 oz)] 51.8 kg (114 lb 3.2 oz) (06/29 0503) Last BM Date: 09/23/16  Intake/Output from previous day: 06/28 0701 - 06/29 0700 In: 2415 [I.V.:1725; NG/GT:60; IV Piggyback:630] Out: 3750 [Urine:2050; Emesis/NG output:1500; Drains:25; Stool:175] Intake/Output this shift: No intake/output data recorded.  General appearance: appears well this am Resp: clear to auscultation bilaterally Cardio: regular rate and rhythm GI: vac in place, some bs present, approp tender, nondistended  Lab Results:   Recent Labs  09/23/16 0430 09/24/16 0553  WBC 10.4 10.6*  HGB 8.0* 9.1*  HCT 27.0* 30.1*  PLT 624* 624*   BMET  Recent Labs  09/22/16 1806 09/23/16 0430  NA 144 141  K 3.2* 3.1*  CL 109 104  CO2 29 30  GLUCOSE 108* 108*  BUN 14 11  CREATININE 0.34* 0.36*  CALCIUM 7.9* 7.9*   PT/INR No results for input(s): LABPROT, INR in the last 72 hours. ABG No results for input(s): PHART, HCO3 in the last 72 hours.  Invalid input(s): PCO2, PO2  Studies/Results: Dg Abd Portable 1v  Result Date: 09/23/2016 CLINICAL DATA:  Postop ileus, abdominal pain EXAM: PORTABLE ABDOMEN - 1 VIEW COMPARISON:  09/21/2016, 09/20/2016 FINDINGS: NG tube tip within the distal stomach. Mild gaseous distention of the distal small bowel and the colon. Air is present in the rectum. Rectal Foley noted. Bowel gas pattern consistent with resolving ileus. No acute osseous finding or abnormal calcification. IMPRESSION: Resolving  postoperative ileus. Electronically Signed   By: Jerilynn Mages.  Shick M.D.   On: 09/23/2016 07:59    Anti-infectives: Anti-infectives    Start     Dose/Rate Route Frequency Ordered Stop   09/21/16 2230  vancomycin (VANCOCIN) IVPB 1000 mg/200 mL premix  Status:  Discontinued     1,000 mg 200 mL/hr over 60 Minutes Intravenous Every 8 hours 09/21/16 2210 09/22/16 1102   09/21/16 1100  anidulafungin (ERAXIS) 100 mg in sodium chloride 0.9 % 100 mL IVPB     100 mg 78 mL/hr over 100 Minutes Intravenous Every 24 hours 09/20/16 1048     09/20/16 1100  anidulafungin (ERAXIS) 200 mg in sodium chloride 0.9 % 200 mL IVPB     200 mg 78 mL/hr over 200 Minutes Intravenous  Once 09/20/16 1048 09/20/16 1510   09/20/16 0145  vancomycin (VANCOCIN) IVPB 750 mg/150 ml premix  Status:  Discontinued     750 mg 150 mL/hr over 60 Minutes Intravenous Every 12 hours 09/20/16 0135 09/21/16 2210   09/19/16 0800  meropenem (MERREM) 1 g in sodium chloride 0.9 % 100 mL IVPB     1 g 200 mL/hr over 30 Minutes Intravenous Every 8 hours 09/19/16 0738     09/11/16 1300  metroNIDAZOLE (FLAGYL) IVPB 500 mg  Status:  Discontinued     500 mg 100 mL/hr over 60 Minutes Intravenous Every 8 hours 09/11/16 1208 09/16/16 0912   09/11/16 1300  meropenem (MERREM) 1 g in sodium chloride 0.9 % 100 mL IVPB  Status:  Discontinued     1 g 200 mL/hr over 30 Minutes Intravenous Every 8 hours 09/11/16 1259 09/16/16 0912      Assessment/Plan: POD 17 sbr with perforation, Brandi Leonard, Dr Brantley Stage  1. Neuro- continue current iv pain control until taking po 2. CV/Pulm- pulmonary toilet 3. GI- vac MWF, will clamp tube today and hopefully can work on diet by tomorrow. I think she is finally resolving ileus after ng and drainage of intraabdominal infection.  If she is not able to tolerate enterally by tomorrow due to ileus she will need tna started, consider fu ct scan next week, dc rectal tube today 4. ID- continue abx, improving, if stops improving or  worsens will discuss drainage of pelvis 5. Will need long term fu of gyn abnormalities on [place] 6. lovenox scds 7. Can tx to stepdown  [facility] 09/24/2016

## 2016-09-25 DIAGNOSIS — J431 Panlobular emphysema: Secondary | ICD-10-CM

## 2016-09-25 LAB — COMPREHENSIVE METABOLIC PANEL
ALT: 32 U/L (ref 14–54)
ANION GAP: 11 (ref 5–15)
AST: 21 U/L (ref 15–41)
Albumin: 2 g/dL — ABNORMAL LOW (ref 3.5–5.0)
Alkaline Phosphatase: 204 U/L — ABNORMAL HIGH (ref 38–126)
BILIRUBIN TOTAL: 0.3 mg/dL (ref 0.3–1.2)
CHLORIDE: 100 mmol/L — AB (ref 101–111)
CO2: 27 mmol/L (ref 22–32)
Calcium: 8.4 mg/dL — ABNORMAL LOW (ref 8.9–10.3)
Creatinine, Ser: 0.42 mg/dL — ABNORMAL LOW (ref 0.44–1.00)
Glucose, Bld: 131 mg/dL — ABNORMAL HIGH (ref 65–99)
POTASSIUM: 3 mmol/L — AB (ref 3.5–5.1)
Sodium: 138 mmol/L (ref 135–145)
TOTAL PROTEIN: 6 g/dL — AB (ref 6.5–8.1)

## 2016-09-25 LAB — CBC WITH DIFFERENTIAL/PLATELET
BASOS ABS: 0 10*3/uL (ref 0.0–0.1)
Basophils Relative: 0 %
EOS PCT: 1 %
Eosinophils Absolute: 0.1 10*3/uL (ref 0.0–0.7)
HEMATOCRIT: 35.2 % — AB (ref 36.0–46.0)
Hemoglobin: 10.8 g/dL — ABNORMAL LOW (ref 12.0–15.0)
LYMPHS PCT: 16 %
Lymphs Abs: 1.6 10*3/uL (ref 0.7–4.0)
MCH: 24.9 pg — AB (ref 26.0–34.0)
MCHC: 30.7 g/dL (ref 30.0–36.0)
MCV: 81.1 fL (ref 78.0–100.0)
MONO ABS: 0.5 10*3/uL (ref 0.1–1.0)
MONOS PCT: 5 %
NEUTROS ABS: 7.9 10*3/uL — AB (ref 1.7–7.7)
Neutrophils Relative %: 78 %
PLATELETS: 633 10*3/uL — AB (ref 150–400)
RBC: 4.34 MIL/uL (ref 3.87–5.11)
RDW: 17.7 % — AB (ref 11.5–15.5)
WBC: 10.1 10*3/uL (ref 4.0–10.5)

## 2016-09-25 LAB — GLUCOSE, CAPILLARY
GLUCOSE-CAPILLARY: 104 mg/dL — AB (ref 65–99)
GLUCOSE-CAPILLARY: 115 mg/dL — AB (ref 65–99)
Glucose-Capillary: 124 mg/dL — ABNORMAL HIGH (ref 65–99)

## 2016-09-25 LAB — MAGNESIUM: MAGNESIUM: 1.8 mg/dL (ref 1.7–2.4)

## 2016-09-25 MED ORDER — POTASSIUM CHLORIDE 10 MEQ/100ML IV SOLN
10.0000 meq | INTRAVENOUS | Status: DC
  Administered 2016-09-25: 10 meq via INTRAVENOUS
  Filled 2016-09-25: qty 100

## 2016-09-25 MED ORDER — ENSURE SURGERY PO LIQD
237.0000 mL | Freq: Two times a day (BID) | ORAL | Status: DC
  Administered 2016-09-25 – 2016-09-26 (×2): 237 mL via ORAL
  Filled 2016-09-25 (×10): qty 237

## 2016-09-25 MED ORDER — POTASSIUM CHLORIDE 20 MEQ PO PACK
60.0000 meq | PACK | Freq: Once | ORAL | Status: AC
  Administered 2016-09-25: 60 meq via ORAL
  Filled 2016-09-25: qty 3

## 2016-09-25 MED ORDER — PIPERACILLIN-TAZOBACTAM 3.375 G IVPB
3.3750 g | Freq: Three times a day (TID) | INTRAVENOUS | Status: DC
  Administered 2016-09-25 – 2016-09-30 (×15): 3.375 g via INTRAVENOUS
  Filled 2016-09-25 (×19): qty 50

## 2016-09-25 MED ORDER — MAGNESIUM SULFATE IN D5W 1-5 GM/100ML-% IV SOLN
1.0000 g | Freq: Once | INTRAVENOUS | Status: AC
Start: 2016-09-25 — End: 2016-09-25
  Administered 2016-09-25: 1 g via INTRAVENOUS
  Filled 2016-09-25: qty 100

## 2016-09-25 MED ORDER — ENOXAPARIN SODIUM 40 MG/0.4ML ~~LOC~~ SOLN
40.0000 mg | SUBCUTANEOUS | Status: DC
  Administered 2016-09-25 – 2016-09-30 (×6): 40 mg via SUBCUTANEOUS
  Filled 2016-09-25 (×6): qty 0.4

## 2016-09-25 NOTE — Progress Notes (Signed)
Subjective/Chief Complaint: Feels better, some flatus, no bm, no n/v with tube clamped   Objective: Vital signs in last 24 hours: Temp:  [97.7 F (36.5 C)-99.3 F (37.4 C)] (P) 99.3 F (37.4 C) (06/30 0831) Pulse Rate:  [99-120] (P) 107 (06/30 0831) Resp:  [16-24] 22 (06/30 0352) BP: (103-125)/(61-85) (P) 116/83 (06/30 0831) SpO2:  [91 %-97 %] 95 % (06/30 0352) Weight:  [54 kg (119 lb 0.8 oz)] 54 kg (119 lb 0.8 oz) (06/30 0433) Last BM Date: 09/24/16  Intake/Output from previous day: 06/29 0701 - 06/30 0700 In: 1170 [I.V.:770; NG/GT:70; IV Piggyback:330] Out: 905 [Urine:730; Emesis/NG output:150; Drains:25] Intake/Output this shift: No intake/output data recorded.  GI: nondistended, nontender vac in place some bs present  Lab Results:   Recent Labs  09/23/16 0430 09/24/16 0553  WBC 10.4 10.6*  HGB 8.0* 9.1*  HCT 27.0* 30.1*  PLT 624* 624*   BMET  Recent Labs  09/23/16 0430 09/24/16 0553  NA 141 137  K 3.1* 3.5  CL 104 101  CO2 30 28  GLUCOSE 108* 114*  BUN 11 6  CREATININE 0.36* 0.38*  CALCIUM 7.9* 8.1*   PT/INR No results for input(s): LABPROT, INR in the last 72 hours. ABG No results for input(s): PHART, HCO3 in the last 72 hours.  Invalid input(s): PCO2, PO2  Studies/Results: No results found.  Anti-infectives: Anti-infectives    Start     Dose/Rate Route Frequency Ordered Stop   09/21/16 2230  vancomycin (VANCOCIN) IVPB 1000 mg/200 mL premix  Status:  Discontinued     1,000 mg 200 mL/hr over 60 Minutes Intravenous Every 8 hours 09/21/16 2210 09/22/16 1102   09/21/16 1100  anidulafungin (ERAXIS) 100 mg in sodium chloride 0.9 % 100 mL IVPB     100 mg 78 mL/hr over 100 Minutes Intravenous Every 24 hours 09/20/16 1048     09/20/16 1100  anidulafungin (ERAXIS) 200 mg in sodium chloride 0.9 % 200 mL IVPB     200 mg 78 mL/hr over 200 Minutes Intravenous  Once 09/20/16 1048 09/20/16 1510   09/20/16 0145  vancomycin (VANCOCIN) IVPB 750 mg/150  ml premix  Status:  Discontinued     750 mg 150 mL/hr over 60 Minutes Intravenous Every 12 hours 09/20/16 0135 09/21/16 2210   09/19/16 0800  meropenem (MERREM) 1 g in sodium chloride 0.9 % 100 mL IVPB     1 g 200 mL/hr over 30 Minutes Intravenous Every 8 hours 09/19/16 0738     09/11/16 1300  metroNIDAZOLE (FLAGYL) IVPB 500 mg  Status:  Discontinued     500 mg 100 mL/hr over 60 Minutes Intravenous Every 8 hours 09/11/16 1208 09/16/16 0912   09/11/16 1300  meropenem (MERREM) 1 g in sodium chloride 0.9 % 100 mL IVPB  Status:  Discontinued     1 g 200 mL/hr over 30 Minutes Intravenous Every 8 hours 09/11/16 1259 09/16/16 0912      Assessment/Plan: POD 18sbr with perforation, Oval Linsey, Dr Brantley Stage  1. Neuro- continue current iv pain control until tolerating po 2. CV/Pulm- pulmonary toilet 3. GI- vac MWF, will try diet today and to dc tube I think ileus has resolved, consider fu ct scan next week 4. ID- continue abx, improving, if stops improving or worsens will discuss drainage of pelvis, recheck cbc in am, would continue abx until at least scan repeated 5. Will need long term fu of gyn abnormalities on [place] 6.sq heparin (she can be on lovenox from my standpoint  if medicine wants to change) scds   [facility] 09/25/2016

## 2016-09-25 NOTE — Progress Notes (Signed)
PROGRESS NOTE    Brandi Leonard  TDD:[id number] DOB: 26-Jan-1966 DOA: 09/10/2016 PCP: No primary care provider on file.   Brief Narrative:  51 year old WF PMHx none on file   Admitted to [facility] 6/11 with Small bowel obstruction, underwent emergent laparotomy on 6/12 for perforation with peritonitis. Failed extubation postoperatively and then developed diffuse bilateral infiltrates consistent with ARDS and septic shock requiring Levophed.  Extubated 6/21.  On 6/25 developed worsening respiratory failure, ?aspiration v HCAP, large volume stool output.  PCCM reconsulted   Subjective: 6/30  A/O 4, negative SOB, negative CP, positive mild abdominal pain, negative N/V ambulating around ward, sitting comfortably in chair.   Assessment & Plan:   Principal Problem:   Septic shock ([place]) Active Problems:   Encounter for central line placement   ARDS (adult respiratory distress syndrome) (HCC)   Diarrhea   Severe protein-calorie malnutrition (HCC)   Leukocytosis   Peritonitis (HCC)   Hypernatremia   Thrombocytosis (HCC)    Respiratory failure with hypoxia /Aspiration PNA/ ARDS (extubated 6/21) -Resolved -Titrate O2 to maintain SPO2> 93% -Patient currently on antibiotics for SBO with perforation would also cover aspiration pneumonia  COPD without acute exacerbation -Atrovent and Xopenex PRN   Cardiogenic Shock/Demand ischemia   -likely septic due to peritonitis - resolved.  -Patient currently asymptomatic  Acute diastolic CHF -Strict I&O since admission - 5.9 L -Daily weight Filed Weights   09/23/16 0500 09/24/16 0503 09/25/16 0433  Weight: 117 lb 8.1 oz (53.3 kg) 114 lb 3.2 oz (51.8 kg) 119 lb 0.8 oz (54 kg)  -Transfuse for hemoglobin<8  SBO with perforation/Septic Shock  -complicated by perforation now s/p repair 6/12; c/b post op abd pain, colitis and what appears to be intra-abd abscess by CT scan 6/25 -Cont wound vac per surgeons (MWF) -S/p CT guided drainage of  small bilateral abd abscesses 6/26 -NG tube discontinued per surgery patient doing well. Diet advanced -Complete antibiotics per surgery.  Shock liver  -Trend LFTs  Severe protein cal malnutrition -Tolerating regular diet  Large volume stool output  - C.Diff neg   Anemia of critical illness.  -No evidence of acute bleeding  Recent Labs Lab 09/20/16 0329 09/21/16 0250 09/21/16 2135 09/22/16 0530 09/23/16 0430 09/24/16 0553  HGB 9.9* 8.9* 7.9* 7.7* 8.0* 9.1*  -Stable   Thrombocytosis  -most likely reactive, stable   Relative Adrenal insufficiency (cortisol 12 at [place])  -Resolved -Continue moderate SSI  Acute metabolic encephalopathy -Occasional jerking movements  -resolved  Seizure - ?benzo withdrawal  -Cont Clonazepam as ODT (on reduced dosing from home and doing well) -Morphine PRN   -Watch for seizure  Hypokalemia -Potassium goal>4 -Potassium IV 95meq  Hypomagnesemia -Magnessium goal>2 -Magnesium IV 1g    DVT prophylaxis: Lovenox Code Status: Full Family Communication: None Disposition Plan: Per surgery   Consultants:  PC CM CCS   Procedures/Significant Events:  6/11 admit for SBO 6/11 CT abdomen  > multiple dilated loops of fluid filled small bowel consistent with mechanical small bowel obstruction 6/12 perf to OR for repair 6/13 contnue shock, resp failure 6/15 transfer to [facility] for ICU admit. 6/16 Echo  >>LVEF= 50% to 55%.  -(grade 1 diastolic dysfunction).  - Pericardium, extracardiac: There is a large left pleural effusion.  6/24 CTA chest >>> Neg PE, L>R bilat effusions, LLL consolidation with air bronchograms, RML volume loss  BLE venous doppler 6/25>>> neg   6/25 CT Abd : Colon is diffusely dilated with fluid despite having a rectal tube. No significant small  bowel dilatation.There are two fluid collections in the pelvis which were likely present prior to the recent surgery. The largest is in the left lower quadrant measuring  over 7 cm and there is a smaller complex collection in the right anterior pelvic region measuring 4.4 cm. These could be adnexal cystic structures but indeterminate. Ideally these areas could be further characterized with a pelvic ultrasound but may be difficult with the fluid-filled colon. There are two new postoperative fluid collections along the lateral abdomen. These could represent pockets of loculated ascites or abscess collections. Trace left pleural fluid with consolidation or volume loss in the left lower lobe. Volume loss in the right lower lobe. 6/25- worsening resp status 6/26 IR drainage pus on left but NO drain required      VENTILATOR SETTINGS: None   Cultures Blood 6/12 > Coag neg staph 1/2 bottles BC x 2 6/15>>> NEG Sputum 6/15>> few candida CDiff 6/22>>> neg  BC x 2 6/24>>>  Surgical wound 6/26: rare GNR>>>   Antimicrobials: Anti-infectives    Start     Stop   09/21/16 2230  vancomycin (VANCOCIN) IVPB 1000 mg/200 mL premix  Status:  Discontinued     09/22/16 1102   09/21/16 1100  anidulafungin (ERAXIS) 100 mg in sodium chloride 0.9 % 100 mL IVPB         09/20/16 1100  anidulafungin (ERAXIS) 200 mg in sodium chloride 0.9 % 200 mL IVPB     09/20/16 1510   09/20/16 0145  vancomycin (VANCOCIN) IVPB 750 mg/150 ml premix  Status:  Discontinued     09/21/16 2210   09/19/16 0800  meropenem (MERREM) 1 g in sodium chloride 0.9 % 100 mL IVPB         09/11/16 1300  metroNIDAZOLE (FLAGYL) IVPB 500 mg  Status:  Discontinued     09/16/16 0912   09/11/16 1300  meropenem (MERREM) 1 g in sodium chloride 0.9 % 100 mL IVPB  Status:  Discontinued     09/16/16 0912       Devices    LINES / TUBES:  ETT 6/13 >6/21 R fem CVL 6/12 >6/15 Art line rt radial 6/15 > 6/21 IJ CVL 6/15>>>    Continuous Infusions: . anidulafungin Stopped (09/24/16 1230)  . dextrose 75 mL/hr at 09/25/16 0500  . meropenem (MERREM) IV Stopped (09/25/16 0039)     Objective: Vitals:    09/24/16 1942 09/25/16 0012 09/25/16 0352 09/25/16 0433  BP: 115/61 124/82 115/79   Pulse: (!) 102 99 (!) 102   Resp: 18 16 (!) 22   Temp: 98.9 F (37.2 C) 98.8 F (37.1 C) 99.3 F (37.4 C)   TempSrc: Oral Oral Oral   SpO2: 94% 92% 95%   Weight:    119 lb 0.8 oz (54 kg)  Height:        Intake/Output Summary (Last 24 hours) at 09/25/16 0828 Last data filed at 09/25/16 0546  Gross per 24 hour  Intake              995 ml  Output              475 ml  Net              520 ml   Filed Weights   09/23/16 0500 09/24/16 0503 09/25/16 0433  Weight: 117 lb 8.1 oz (53.3 kg) 114 lb 3.2 oz (51.8 kg) 119 lb 0.8 oz (54 kg)    Examination:  General: A/O 4 No acute  respiratory distress, cachectic Eyes: negative scleral hemorrhage, negative anisocoria, negative icterus ENT: Negative Runny nose, negative gingival bleeding, Neck:  Negative scars, masses, torticollis, lymphadenopathy, JVD Lungs: Clear to auscultation bilaterally without wheezes or crackles Cardiovascular: Regular rate and rhythm without murmur gallop or rub normal S1 and S2 Abdomen: Positive abdominal pain greatest LUQ/LLQ( Improving), nondistended, negative bowel sounds, no rebound, no ascites, no appreciable mass, wound VAC in place draining serosanguineous fluid Extremities: No significant cyanosis, clubbing, or edema bilateral lower extremities Skin: Negative rashes, lesions, ulcers Psychiatric:  Negative depression, negative anxiety, negative fatigue, negative mania  Central nervous system:  Cranial nerves II through XII intact, tongue/uvula midline, all extremities muscle strength 5/5, sensation intact throughout, negative dysarthria, negative expressive aphasia, negative receptive aphasia.  .     Data Reviewed: Care during the described time interval was provided by me .  I have reviewed this patient's available data, including medical history, events of note, physical examination, and all test results as part of my  evaluation. I have personally reviewed and interpreted all radiology studies.  CBC:  Recent Labs Lab 09/21/16 0250 09/21/16 2135 09/22/16 0530 09/23/16 0430 09/24/16 0553  WBC 24.3* 14.9* 14.7* 10.4 10.6*  NEUTROABS 20.6* 12.6* 12.4* 8.7* 9.0*  HGB 8.9* 7.9* 7.7* 8.0* 9.1*  HCT 28.9* 26.5* 26.0* 27.0* 30.1*  MCV 80.5 82.3 83.1 82.3 82.7  PLT 790* 633* 659* 624* 130*   Basic Metabolic Panel:  Recent Labs Lab 09/19/16 0245  09/21/16 0250  09/21/16 2135 09/22/16 0530 09/22/16 1806 09/23/16 0430 09/24/16 0553  NA 140  < > 149*  < > 147* 148* 144 141 137  K 4.0  < > 2.5*  < > 3.9 3.8 3.2* 3.1* 3.5  CL 116*  < > 114*  < > 113* 111 109 104 101  CO2 17*  < > 28  < > 29 30 29 30 28   GLUCOSE 145*  < > 152*  < > 115* 103* 108* 108* 114*  BUN 27*  < > 22*  < > 17 15 14 11 6   CREATININE 0.45  < > 0.45  < > 0.42* 0.45 0.34* 0.36* 0.38*  CALCIUM 8.5*  < > 7.8*  < > 7.6* 7.9* 7.9* 7.9* 8.1*  MG  --   < > 1.6*  --  2.3 2.1  --  1.8 1.9  PHOS 3.7  --  3.1  --   --  3.9  --   --  3.5  < > = values in this interval not displayed. GFR: Estimated Creatinine Clearance: 73.3 mL/min (A) (by C-G formula based on SCr of 0.38 mg/dL (L)). Liver Function Tests:  Recent Labs Lab 09/20/16 0329 09/21/16 0250 09/21/16 2135 09/22/16 0530 09/23/16 0430  AST 26 57* 41 28 17  ALT 53 84* 85* 71* 44  ALKPHOS 348* 252* 190* 175* 127*  BILITOT 0.6 0.3 0.3 0.1* 0.3  PROT 6.6 4.9* 4.8* 4.7* 4.5*  ALBUMIN 2.1* 1.8* 1.7* 1.6* 1.5*    Recent Labs Lab 09/20/16 0125  LIPASE 110*  AMYLASE 121*   No results for input(s): AMMONIA in the last 168 hours. Coagulation Profile: No results for input(s): INR, PROTIME in the last 168 hours. Cardiac Enzymes:  Recent Labs Lab 09/19/16 0219 09/19/16 1100 09/19/16 1608  TROPONINI 0.04* 0.05* 0.05*   BNP (last 3 results) No results for input(s): PROBNP in the last 8760 hours. HbA1C: No results for input(s): HGBA1C in the last 72 hours. CBG:  Recent  Labs Lab  09/24/16 0831 09/24/16 1147 09/24/16 1522 09/24/16 1716 09/24/16 2142  GLUCAP 111* 103* 99 92 110*   Lipid Profile: No results for input(s): CHOL, HDL, LDLCALC, TRIG, CHOLHDL, LDLDIRECT in the last 72 hours. Thyroid Function Tests: No results for input(s): TSH, T4TOTAL, FREET4, T3FREE, THYROIDAB in the last 72 hours. Anemia Panel: No results for input(s): VITAMINB12, FOLATE, FERRITIN, TIBC, IRON, RETICCTPCT in the last 72 hours. Urine analysis:    Component Value Date/Time   COLORURINE YELLOW 09/10/2016 1522   APPEARANCEUR CLEAR 09/10/2016 1522   APPEARANCEUR Cloudy 03/05/2012 1834   LABSPEC 1.016 09/10/2016 1522   LABSPEC 1.032 03/05/2012 1834   PHURINE 6.0 09/10/2016 1522   GLUCOSEU 150 (A) 09/10/2016 1522   GLUCOSEU Negative 03/05/2012 1834   HGBUR SMALL (A) 09/10/2016 1522   BILIRUBINUR NEGATIVE 09/10/2016 1522   BILIRUBINUR Negative 03/05/2012 1834   KETONESUR 5 (A) 09/10/2016 1522   PROTEINUR NEGATIVE 09/10/2016 1522   NITRITE NEGATIVE 09/10/2016 1522   LEUKOCYTESUR TRACE (A) 09/10/2016 1522   LEUKOCYTESUR Negative 03/05/2012 1834   Sepsis Labs: @LABRCNTIP (procalcitonin:4,lacticidven:4)  ) Recent Results (from the past 240 hour(s))  C difficile quick scan w PCR reflex     Status: None   Collection Time: 09/17/16  5:25 PM  Result Value Ref Range Status   C Diff antigen NEGATIVE NEGATIVE Final   C Diff toxin NEGATIVE NEGATIVE Final   C Diff interpretation No C. difficile detected.  Final  Culture, blood (Routine X 2) w Reflex to ID Panel     Status: None   Collection Time: 09/19/16  8:18 AM  Result Value Ref Range Status   Specimen Description BLOOD LEFT HAND  Final   Special Requests IN PEDIATRIC BOTTLE Blood Culture adequate volume  Final   Culture NO GROWTH 5 DAYS  Final   Report Status 09/24/2016 FINAL  Final  Culture, blood (Routine X 2) w Reflex to ID Panel     Status: None   Collection Time: 09/19/16  8:20 AM  Result Value Ref Range Status    Specimen Description BLOOD RIGHT HAND  Final   Special Requests   Final    BOTTLES DRAWN AEROBIC AND ANAEROBIC Blood Culture adequate volume   Culture NO GROWTH 5 DAYS  Final   Report Status 09/24/2016 FINAL  Final  Fungus culture, blood     Status: None (Preliminary result)   Collection Time: 09/20/16  3:00 AM  Result Value Ref Range Status   Specimen Description BLOOD BLOOD LEFT FOREARM  Final   Special Requests   Final    BOTTLES DRAWN AEROBIC ONLY Blood Culture adequate volume   Culture NO GROWTH 4 DAYS  Final   Report Status PENDING  Incomplete  Aerobic/Anaerobic Culture (surgical/deep wound)     Status: None (Preliminary result)   Collection Time: 09/21/16  4:15 PM  Result Value Ref Range Status   Specimen Description ABDOMEN LEFT  Final   Special Requests Normal  Final   Gram Stain   Final    ABUNDANT WBC PRESENT,BOTH PMN AND MONONUCLEAR RARE GRAM NEGATIVE RODS    Culture   Final    FEW ESCHERICHIA COLI NO ANAEROBES ISOLATED; CULTURE IN PROGRESS FOR 5 DAYS    Report Status PENDING  Incomplete   Organism ID, Bacteria ESCHERICHIA COLI  Final      Susceptibility   Escherichia coli - MIC*    AMPICILLIN 8 SENSITIVE Sensitive     CEFAZOLIN <=4 SENSITIVE Sensitive     CEFEPIME <=1 SENSITIVE Sensitive  CEFTAZIDIME <=1 SENSITIVE Sensitive     CEFTRIAXONE <=1 SENSITIVE Sensitive     CIPROFLOXACIN >=4 RESISTANT Resistant     GENTAMICIN <=1 SENSITIVE Sensitive     IMIPENEM <=0.25 SENSITIVE Sensitive     TRIMETH/SULFA <=20 SENSITIVE Sensitive     AMPICILLIN/SULBACTAM 4 SENSITIVE Sensitive     PIP/TAZO <=4 SENSITIVE Sensitive     Extended ESBL NEGATIVE Sensitive     * FEW ESCHERICHIA COLI  Aerobic/Anaerobic Culture (surgical/deep wound)     Status: None (Preliminary result)   Collection Time: 09/21/16  4:16 PM  Result Value Ref Range Status   Specimen Description ABDOMEN RIGHT  Final   Special Requests Normal  Final   Gram Stain   Final    ABUNDANT WBC PRESENT,BOTH PMN  AND MONONUCLEAR NO ORGANISMS SEEN    Culture   Final    NO GROWTH 3 DAYS NO ANAEROBES ISOLATED; CULTURE IN PROGRESS FOR 5 DAYS   Report Status PENDING  Incomplete         Radiology Studies: No results found.      Scheduled Meds: . aspirin  162 mg Oral Daily  . chlorhexidine  15 mL Mouth Rinse BID  . clonazepam  0.5 mg Per Tube BID  . heparin  5,000 Units Subcutaneous Q8H  . insulin aspart  0-15 Units Subcutaneous TID WC  . mouth rinse  15 mL Mouth Rinse BID  . mouth rinse  15 mL Mouth Rinse q12n4p  . pantoprazole sodium  40 mg Per Tube Daily  . sodium chloride flush  10-40 mL Intracatheter Q12H   Continuous Infusions: . anidulafungin Stopped (09/24/16 1230)  . dextrose 75 mL/hr at 09/25/16 0500  . meropenem (MERREM) IV Stopped (09/25/16 0039)     LOS: 15 days    Time spent: 40 minutes    Garvin Ellena, Geraldo Docker, MD [facility] Pager [phone]   If 7PM-7AM, please contact night-coverage [email or web address] Password [email or web address] 09/25/2016, 8:28 AM

## 2016-09-25 NOTE — Progress Notes (Signed)
  Speech Language Pathology Treatment: Dysphagia  Patient Details Name: Brandi Leonard MRN: [id number] DOB: March 28, 1966 Today's Date: 09/25/2016 Time: 2924-4628 SLP Time Calculation (min) (ACUTE ONLY): 13 min  Assessment / Plan / Recommendation Clinical Impression  SLP followed up for PO trials with okay from MD for diet advancement as tolerated. Pt alert upright in chair at bedside. Pt displays significant improvements in swallow function this date as compared to prior ST interactions. Suspect prior swallow difficulty directly influenced from lengthy intubation and decreased mentation, extubation date noted 6/21. No overt s/sx of aspiration with any PO this date. Recommend regular thin liquid diet with intermittent supervision to ensure swallow safety. ST to follow up x1 for diet tolerance.      HPI HPI: 51 year old admitted to [facility] 6/11 with Small bowel obstruction, underwent emergent laparotomy on 6/12 for perforation with peritonitis. Failed extubation postoperatively and then developed diffuse bilateral infiltrates consistent with ARDS and septic shock requiring Levophed      SLP Plan  Continue with current plan of care       Recommendations  Diet recommendations: Regular;Thin liquid Liquids provided via: Cup;Straw Medication Administration: Whole meds with liquid Supervision: Patient able to self feed;Intermittent supervision to cue for compensatory strategies Compensations: Slow rate;Small sips/bites Postural Changes and/or Swallow Maneuvers: Seated upright 90 degrees                Oral Care Recommendations: Oral care BID Follow up Recommendations: 24 hour supervision/assistance SLP Visit Diagnosis: Dysphagia, unspecified (R13.10) Plan: Continue with current plan of care       Harrison MA, [place] 09/25/2016, 2:30 PM

## 2016-09-25 NOTE — Progress Notes (Signed)
ANTICOAGULATION CONSULT NOTE  Pharmacy Consult for lovenox Indication: VTE prophylaxis  Assessment: 80 yof POD18 SBR with perforation. Pharmacy consulted to transition from heparin SQ to Lovenox for VTE prophylaxis. Last dose of heparin SQ at ~1300. CBC stable, no bleed documented. CrCl~70-75.  Goal of Therapy:  VTE prevention Monitor platelets by anticoagulation protocol: Yes   Plan:  D/c heparin SQ Lovenox 40mg  [place] q24h Monitor CBC, s/sx bleeding Pharmacy will sign off consult  Elicia Lamp, PharmD, BCPS Clinical Pharmacist 09/25/2016 3:14 PM

## 2016-09-26 DIAGNOSIS — T17908A Unspecified foreign body in respiratory tract, part unspecified causing other injury, initial encounter: Secondary | ICD-10-CM

## 2016-09-26 DIAGNOSIS — J189 Pneumonia, unspecified organism: Secondary | ICD-10-CM

## 2016-09-26 LAB — CBC
HEMATOCRIT: 31.9 % — AB (ref 36.0–46.0)
Hemoglobin: 9.8 g/dL — ABNORMAL LOW (ref 12.0–15.0)
MCH: 25.1 pg — AB (ref 26.0–34.0)
MCHC: 30.7 g/dL (ref 30.0–36.0)
MCV: 81.8 fL (ref 78.0–100.0)
PLATELETS: 568 10*3/uL — AB (ref 150–400)
RBC: 3.9 MIL/uL (ref 3.87–5.11)
RDW: 17.9 % — AB (ref 11.5–15.5)
WBC: 9.4 10*3/uL (ref 4.0–10.5)

## 2016-09-26 LAB — AEROBIC/ANAEROBIC CULTURE W GRAM STAIN (SURGICAL/DEEP WOUND): Culture: NO GROWTH

## 2016-09-26 LAB — AEROBIC/ANAEROBIC CULTURE (SURGICAL/DEEP WOUND)
SPECIAL REQUESTS: NORMAL
SPECIAL REQUESTS: NORMAL

## 2016-09-26 LAB — BASIC METABOLIC PANEL
Anion gap: 9 (ref 5–15)
CO2: 25 mmol/L (ref 22–32)
Calcium: 8.3 mg/dL — ABNORMAL LOW (ref 8.9–10.3)
Chloride: 102 mmol/L (ref 101–111)
Creatinine, Ser: 0.52 mg/dL (ref 0.44–1.00)
GFR calc Af Amer: >60 mL/min (ref >60)
GLUCOSE: 162 mg/dL — AB (ref 65–99)
POTASSIUM: 3.4 mmol/L — AB (ref 3.5–5.1)
Sodium: 136 mmol/L (ref 135–145)

## 2016-09-26 LAB — GLUCOSE, CAPILLARY
GLUCOSE-CAPILLARY: 138 mg/dL — AB (ref 65–99)
GLUCOSE-CAPILLARY: 165 mg/dL — AB (ref 65–99)
Glucose-Capillary: 135 mg/dL — ABNORMAL HIGH (ref 65–99)
Glucose-Capillary: 112 mg/dL — ABNORMAL HIGH (ref 65–99)

## 2016-09-26 LAB — FUNGUS CULTURE, BLOOD
Culture: NO GROWTH
Special Requests: ADEQUATE

## 2016-09-26 MED ORDER — POTASSIUM CHLORIDE CRYS ER 20 MEQ PO TBCR
50.0000 meq | EXTENDED_RELEASE_TABLET | Freq: Once | ORAL | Status: AC
  Administered 2016-09-26: 09:00:00 50 meq via ORAL
  Filled 2016-09-26: qty 2

## 2016-09-26 MED ORDER — OXYCODONE HCL 5 MG PO TABS
5.0000 mg | ORAL_TABLET | ORAL | Status: DC | PRN
  Administered 2016-09-26 – 2016-09-29 (×3): 5 mg via ORAL
  Filled 2016-09-26 (×3): qty 1

## 2016-09-26 MED ORDER — DOCUSATE SODIUM 100 MG PO CAPS
100.0000 mg | ORAL_CAPSULE | Freq: Two times a day (BID) | ORAL | Status: DC
  Administered 2016-09-26 – 2016-10-01 (×11): 100 mg via ORAL
  Filled 2016-09-26 (×11): qty 1

## 2016-09-26 NOTE — Progress Notes (Signed)
Unable to draw labs this am.  Tried multiple times to flush with NS.  No blood return noted.  Both lab and Iv team notified.  Will continue to monitor Brandi Leonard T

## 2016-09-26 NOTE — Progress Notes (Addendum)
Subjective/Chief Complaint: Tolerated liquids yesterday and had a bowel movement.    Objective: Vital signs in last 24 hours: Temp:  [97.7 F (36.5 C)-98.6 F (37 C)] 98.5 F (36.9 C) (07/01 0348) Pulse Rate:  [98-107] 104 (07/01 0837) Resp:  [17-21] 19 (07/01 0837) BP: (112-132)/(75-108) 132/108 (07/01 0837) SpO2:  [94 %-100 %] 100 % (07/01 0837) Weight:  [52.3 kg (115 lb 4.8 oz)] 52.3 kg (115 lb 4.8 oz) (07/01 0348) Last BM Date: 09/25/16  Intake/Output from previous day: 06/30 0701 - 07/01 0700 In: 3495 [P.O.:240; I.V.:2775; IV Piggyback:380] Out: 2841 [Urine:1850] Intake/Output this shift: Total I/O In: -  Out: 900 [Urine:900]  GI: nondistended, nontender vac in place some bs present  Lab Results:   Recent Labs  09/25/16 1228 09/26/16 0348  WBC 10.1 9.4  HGB 10.8* 9.8*  HCT 35.2* 31.9*  PLT 633* 568*   BMET  Recent Labs  09/25/16 1228 09/26/16 0348  NA 138 136  K 3.0* 3.4*  CL 100* 102  CO2 27 25  GLUCOSE 131* 162*  BUN <5* <5*  CREATININE 0.42* 0.52  CALCIUM 8.4* 8.3*   PT/INR No results for input(s): LABPROT, INR in the last 72 hours. ABG No results for input(s): PHART, HCO3 in the last 72 hours.  Invalid input(s): PCO2, PO2  Studies/Results: No results found.  Anti-infectives: Anti-infectives    Start     Dose/Rate Route Frequency Ordered Stop   09/25/16 1600  piperacillin-tazobactam (ZOSYN) IVPB 3.375 g     3.375 g 12.5 mL/hr over 240 Minutes Intravenous Every 8 hours 09/25/16 1409     09/21/16 2230  vancomycin (VANCOCIN) IVPB 1000 mg/200 mL premix  Status:  Discontinued     1,000 mg 200 mL/hr over 60 Minutes Intravenous Every 8 hours 09/21/16 2210 09/22/16 1102   09/21/16 1100  anidulafungin (ERAXIS) 100 mg in sodium chloride 0.9 % 100 mL IVPB     100 mg 78 mL/hr over 100 Minutes Intravenous Every 24 hours 09/20/16 1048     09/20/16 1100  anidulafungin (ERAXIS) 200 mg in sodium chloride 0.9 % 200 mL IVPB     200 mg 78 mL/hr  over 200 Minutes Intravenous  Once 09/20/16 1048 09/20/16 1510   09/20/16 0145  vancomycin (VANCOCIN) IVPB 750 mg/150 ml premix  Status:  Discontinued     750 mg 150 mL/hr over 60 Minutes Intravenous Every 12 hours 09/20/16 0135 09/21/16 2210   09/19/16 0800  meropenem (MERREM) 1 g in sodium chloride 0.9 % 100 mL IVPB  Status:  Discontinued     1 g 200 mL/hr over 30 Minutes Intravenous Every 8 hours 09/19/16 0738 09/25/16 1409   09/11/16 1300  metroNIDAZOLE (FLAGYL) IVPB 500 mg  Status:  Discontinued     500 mg 100 mL/hr over 60 Minutes Intravenous Every 8 hours 09/11/16 1208 09/16/16 0912   09/11/16 1300  meropenem (MERREM) 1 g in sodium chloride 0.9 % 100 mL IVPB  Status:  Discontinued     1 g 200 mL/hr over 30 Minutes Intravenous Every 8 hours 09/11/16 1259 09/16/16 0912      Assessment/Plan: POD 19sbr with perforation, [facility] [place], Dr Brantley Stage  1. Neuro-add some po pain meds 2. CV/Pulm- pulmonary toilet 3. GI- vac MWF, will continue diet today; consider fu ct scan next week 4. ID- continue abx, improving, if stops improving or worsens will discuss drainage of pelvis, WBC normal recheck cbc in am, would continue abx until at least scan repeated Consider removing central  line/ urinary catheter 5. Will need long term fu of gyn abnormalities on [place] 6.sq heparin (she can be on lovenox from my standpoint if medicine wants to change) scds   Brandi Leonard 09/26/2016

## 2016-09-26 NOTE — Progress Notes (Signed)
Attempted to ambulate patient. Pt was able to sit on the side of the bed. She stood up for several seconds, sat back down and stated, "I feel too weak and tired to walk today. I will try again tomorrow." Pt encouraged to try and mobilize, but patient went back to bed.

## 2016-09-26 NOTE — Progress Notes (Signed)
PROGRESS NOTE    Brandi Leonard [place]  UYQ:[id number] DOB: [birth year] DOA: 09/10/2016 PCP: No primary care provider on file.   Brief Narrative:  51 year old WF PMHx none on file   Admitted to [facility] 6/11 with Small bowel obstruction, underwent emergent laparotomy on 6/12 for perforation with peritonitis. Failed extubation postoperatively and then developed diffuse bilateral infiltrates consistent with ARDS and septic shock requiring Levophed.  Extubated 6/21.  On 6/25 developed worsening respiratory failure, ?aspiration v HCAP, large volume stool output.  PCCM reconsulted   Subjective: 7/1  A/O 4, negative SOB, negative CP, positive mild abdominal pain, negative N/V, negative postprandial abdominal pain or nausea, laying comfortably in bed.   Assessment & Plan:   Principal Problem:   Septic shock ([place]) Active Problems:   Encounter for central line placement   ARDS (adult respiratory distress syndrome) (HCC)   Diarrhea   Severe protein-calorie malnutrition (HCC)   Leukocytosis   Peritonitis (HCC)   Hypernatremia   Thrombocytosis (HCC)    Respiratory failure with hypoxia /Aspiration PNA/ ARDS (extubated 6/21) -Resolved -Titrate O2 to maintain SPO2> 93% -Patient currently on antibiotics for SBO with perforation would also cover aspiration pneumonia  COPD without acute exacerbation -Atrovent and Xopenex PRN   Cardiogenic Shock/Demand ischemia   -likely septic due to peritonitis - resolved.  -Patient currently asymptomatic  Acute diastolic CHF -Strict I&O since admission - 7.0 L -Daily weight Filed Weights   09/24/16 0503 09/25/16 0433 09/26/16 0348  Weight: 114 lb 3.2 oz (51.8 kg) 119 lb 0.8 oz (54 kg) 115 lb 4.8 oz (52.3 kg)  -Transfuse for hemoglobin<8  SBO with perforation/Septic Shock  -complicated by perforation now s/p repair 6/12; c/b post op abd pain, colitis and what appears to be intra-abd abscess by CT scan 6/25 -Cont wound vac per surgeons (MWF) -S/p  CT guided drainage of small bilateral abd abscesses 6/26 -NG tube discontinued per surgery patient doing well. Diet advanced -Complete antibiotics per surgery. -Per surgery obtain follow-up CT scan this week. Schedule CT abdomen and pelvis W contrast on Thursday  Shock liver  -Trend LFTs  Severe protein cal malnutrition -Tolerating regular diet  Large volume stool output  - C.Diff neg   Anemia of critical illness.  -No evidence of acute bleeding  Recent Labs Lab 09/21/16 2135 09/22/16 0530 09/23/16 0430 09/24/16 0553 09/25/16 1228 09/26/16 0348  HGB 7.9* 7.7* 8.0* 9.1* 10.8* 9.8*  -Stable   Thrombocytosis  -most likely reactive, stable   Relative Adrenal insufficiency (cortisol 12 at [place])  -Resolved -Continue moderate SSI  Acute metabolic encephalopathy -resolved  Seizure - ?benzo withdrawal  -Cont Clonazepam as ODT (on reduced dosing from home and doing well) -Morphine PRN   -Watch for seizure  Hypokalemia -Potassium goal>4 -Potassium IV 18meq  Hypomagnesemia -Magnessium goal>2  Procedure -7/1 discontinued left IJ central line. Hemostasis obtained. Covered and clean. Minimal bleeding   DVT prophylaxis: Lovenox Code Status: Full Family Communication: None Disposition Plan: Per surgery   Consultants:  PC CM CCS   Procedures/Significant Events:  6/11 admit for SBO 6/11 CT abdomen  > multiple dilated loops of fluid filled small bowel consistent with mechanical small bowel obstruction 6/12 perf to OR for repair 6/13 contnue shock, resp failure 6/15 transfer to [facility] for ICU admit. 6/16 Echo  >>LVEF= 50% to 55%.  -(grade 1 diastolic dysfunction).  - Pericardium, extracardiac: There is a large left pleural effusion.  6/24 CTA chest >>> Neg PE, L>R bilat effusions, LLL consolidation with air bronchograms, RML volume  loss  BLE venous doppler 6/25>>> neg   6/25 CT Abd : Colon is diffusely dilated with fluid despite having a rectal tube. No  significant small bowel dilatation.There are two fluid collections in the pelvis which were likely present prior to the recent surgery. The largest is in the left lower quadrant measuring over 7 cm and there is a smaller complex collection in the right anterior pelvic region measuring 4.4 cm. These could be adnexal cystic structures but indeterminate. Ideally these areas could be further characterized with a pelvic ultrasound but may be difficult with the fluid-filled colon. There are two new postoperative fluid collections along the lateral abdomen. These could represent pockets of loculated ascites or abscess collections. Trace left pleural fluid with consolidation or volume loss in the left lower lobe. Volume loss in the right lower lobe. 6/25- worsening resp status 6/26 IR drainage pus on left but NO drain required      VENTILATOR SETTINGS: None   Cultures Blood 6/12 > Coag neg staph 1/2 bottles BC x 2 6/15>>> NEG Sputum 6/15>> few candida CDiff 6/22>>> neg  BC x 2 6/24>>>  Surgical wound 6/26: rare GNR>>>   Antimicrobials: Anti-infectives    Start     Stop   09/21/16 2230  vancomycin (VANCOCIN) IVPB 1000 mg/200 mL premix  Status:  Discontinued     09/22/16 1102   09/21/16 1100  anidulafungin (ERAXIS) 100 mg in sodium chloride 0.9 % 100 mL IVPB         09/20/16 1100  anidulafungin (ERAXIS) 200 mg in sodium chloride 0.9 % 200 mL IVPB     09/20/16 1510   09/20/16 0145  vancomycin (VANCOCIN) IVPB 750 mg/150 ml premix  Status:  Discontinued     09/21/16 2210   09/19/16 0800  meropenem (MERREM) 1 g in sodium chloride 0.9 % 100 mL IVPB         09/11/16 1300  metroNIDAZOLE (FLAGYL) IVPB 500 mg  Status:  Discontinued     09/16/16 0912   09/11/16 1300  meropenem (MERREM) 1 g in sodium chloride 0.9 % 100 mL IVPB  Status:  Discontinued     09/16/16 0912       Devices    LINES / TUBES:  ETT 6/13 >6/21 R fem CVL 6/12 >6/15 Art line rt radial 6/15 > 6/21 IJ CVL  6/15>>>7/1    Continuous Infusions: . anidulafungin Stopped (09/25/16 1205)  . dextrose 75 mL (09/26/16 0101)  . piperacillin-tazobactam (ZOSYN)  IV Stopped (09/26/16 0306)     Objective: Vitals:   09/25/16 1954 09/25/16 2000 09/25/16 2300 09/26/16 0348  BP:  115/75 117/79 121/76  Pulse: (!) 107 100 98 99  Resp: 17 19 17 20   Temp:  98.5 F (36.9 C) 97.7 F (36.5 C) 98.5 F (36.9 C)  TempSrc: Oral Oral Oral Oral  SpO2: 98% 98% 100% 94%  Weight:    115 lb 4.8 oz (52.3 kg)  Height:        Intake/Output Summary (Last 24 hours) at 09/26/16 0826 Last data filed at 09/26/16 0600  Gross per 24 hour  Intake             2170 ml  Output             1850 ml  Net              320 ml   Filed Weights   09/24/16 0503 09/25/16 0433 09/26/16 0348  Weight: 114 lb 3.2 oz (51.8  kg) 119 lb 0.8 oz (54 kg) 115 lb 4.8 oz (52.3 kg)    Examination:  General: A/O 4 No acute respiratory distress, cachectic Eyes: negative scleral hemorrhage, negative anisocoria, negative icterus ENT: Negative Runny nose, negative gingival bleeding, Neck:  Negative scars, masses, torticollis, lymphadenopathy, JVD Lungs: Clear to auscultation bilaterally without wheezes or crackles Cardiovascular: Regular rate and rhythm without murmur gallop or rub normal S1 and S2 Abdomen: Positive abdominal pain greatest LUQ/LLQ( Improving), nondistended, negative bowel sounds, no rebound, no ascites, no appreciable mass, wound VAC in place draining serosanguineous fluid Extremities: No significant cyanosis, clubbing, or edema bilateral lower extremities Skin: Negative rashes, lesions, ulcers Psychiatric:  Negative depression, negative anxiety, negative fatigue, negative mania  Central nervous system:  Cranial nerves II through XII intact, tongue/uvula midline, all extremities muscle strength 5/5, sensation intact throughout, negative dysarthria, negative expressive aphasia, negative receptive aphasia.  .     Data  Reviewed: Care during the described time interval was provided by me .  I have reviewed this patient's available data, including medical history, events of note, physical examination, and all test results as part of my evaluation. I have personally reviewed and interpreted all radiology studies.  CBC:  Recent Labs Lab 09/21/16 2135 09/22/16 0530 09/23/16 0430 09/24/16 0553 09/25/16 1228 09/26/16 0348  WBC 14.9* 14.7* 10.4 10.6* 10.1 9.4  NEUTROABS 12.6* 12.4* 8.7* 9.0* 7.9*  --   HGB 7.9* 7.7* 8.0* 9.1* 10.8* 9.8*  HCT 26.5* 26.0* 27.0* 30.1* 35.2* 31.9*  MCV 82.3 83.1 82.3 82.7 81.1 81.8  PLT 633* 659* 624* 624* 633* 297*   Basic Metabolic Panel:  Recent Labs Lab 09/21/16 0250  09/21/16 2135 09/22/16 0530 09/22/16 1806 09/23/16 0430 09/24/16 0553 09/25/16 1228 09/26/16 0348  NA 149*  < > 147* 148* 144 141 137 138 136  K 2.5*  < > 3.9 3.8 3.2* 3.1* 3.5 3.0* 3.4*  CL 114*  < > 113* 111 109 104 101 100* 102  CO2 28  < > 29 30 29 30 28 27 25   GLUCOSE 152*  < > 115* 103* 108* 108* 114* 131* 162*  BUN 22*  < > 17 15 14 11 6  <5* <5*  CREATININE 0.45  < > 0.42* 0.45 0.34* 0.36* 0.38* 0.42* 0.52  CALCIUM 7.8*  < > 7.6* 7.9* 7.9* 7.9* 8.1* 8.4* 8.3*  MG 1.6*  --  2.3 2.1  --  1.8 1.9 1.8  --   PHOS 3.1  --   --  3.9  --   --  3.5  --   --   < > = values in this interval not displayed. GFR: Estimated Creatinine Clearance: 71 mL/min (by C-G formula based on SCr of 0.52 mg/dL). Liver Function Tests:  Recent Labs Lab 09/21/16 0250 09/21/16 2135 09/22/16 0530 09/23/16 0430 09/25/16 1228  AST 57* 41 28 17 21   ALT 84* 85* 71* 44 32  ALKPHOS 252* 190* 175* 127* 204*  BILITOT 0.3 0.3 0.1* 0.3 0.3  PROT 4.9* 4.8* 4.7* 4.5* 6.0*  ALBUMIN 1.8* 1.7* 1.6* 1.5* 2.0*    Recent Labs Lab 09/20/16 0125  LIPASE 110*  AMYLASE 121*   No results for input(s): AMMONIA in the last 168 hours. Coagulation Profile: No results for input(s): INR, PROTIME in the last 168 hours. Cardiac  Enzymes:  Recent Labs Lab 09/19/16 1100 09/19/16 1608  TROPONINI 0.05* 0.05*   BNP (last 3 results) No results for input(s): PROBNP in the last 8760 hours. HbA1C: No results  for input(s): HGBA1C in the last 72 hours. CBG:  Recent Labs Lab 09/24/16 1716 09/24/16 2142 09/25/16 0828 09/25/16 1752 09/25/16 2157  GLUCAP 92 110* 104* 115* 124*   Lipid Profile: No results for input(s): CHOL, HDL, LDLCALC, TRIG, CHOLHDL, LDLDIRECT in the last 72 hours. Thyroid Function Tests: No results for input(s): TSH, T4TOTAL, FREET4, T3FREE, THYROIDAB in the last 72 hours. Anemia Panel: No results for input(s): VITAMINB12, FOLATE, FERRITIN, TIBC, IRON, RETICCTPCT in the last 72 hours. Urine analysis:    Component Value Date/Time   COLORURINE YELLOW 09/10/2016 1522   APPEARANCEUR CLEAR 09/10/2016 1522   APPEARANCEUR Cloudy 03/05/2012 1834   LABSPEC 1.016 09/10/2016 1522   LABSPEC 1.032 03/05/2012 1834   PHURINE 6.0 09/10/2016 1522   GLUCOSEU 150 (A) 09/10/2016 1522   GLUCOSEU Negative 03/05/2012 1834   HGBUR SMALL (A) 09/10/2016 1522   BILIRUBINUR NEGATIVE 09/10/2016 1522   BILIRUBINUR Negative 03/05/2012 1834   KETONESUR 5 (A) 09/10/2016 1522   PROTEINUR NEGATIVE 09/10/2016 1522   NITRITE NEGATIVE 09/10/2016 1522   LEUKOCYTESUR TRACE (A) 09/10/2016 1522   LEUKOCYTESUR Negative 03/05/2012 1834   Sepsis Labs: @LABRCNTIP (procalcitonin:4,lacticidven:4)  ) Recent Results (from the past 240 hour(s))  C difficile quick scan w PCR reflex     Status: None   Collection Time: 09/17/16  5:25 PM  Result Value Ref Range Status   C Diff antigen NEGATIVE NEGATIVE Final   C Diff toxin NEGATIVE NEGATIVE Final   C Diff interpretation No C. difficile detected.  Final  Culture, blood (Routine X 2) w Reflex to ID Panel     Status: None   Collection Time: 09/19/16  8:18 AM  Result Value Ref Range Status   Specimen Description BLOOD LEFT HAND  Final   Special Requests IN PEDIATRIC BOTTLE Blood  Culture adequate volume  Final   Culture NO GROWTH 5 DAYS  Final   Report Status 09/24/2016 FINAL  Final  Culture, blood (Routine X 2) w Reflex to ID Panel     Status: None   Collection Time: 09/19/16  8:20 AM  Result Value Ref Range Status   Specimen Description BLOOD RIGHT HAND  Final   Special Requests   Final    BOTTLES DRAWN AEROBIC AND ANAEROBIC Blood Culture adequate volume   Culture NO GROWTH 5 DAYS  Final   Report Status 09/24/2016 FINAL  Final  Fungus culture, blood     Status: None (Preliminary result)   Collection Time: 09/20/16  3:00 AM  Result Value Ref Range Status   Specimen Description BLOOD BLOOD LEFT FOREARM  Final   Special Requests   Final    BOTTLES DRAWN AEROBIC ONLY Blood Culture adequate volume   Culture NO GROWTH 5 DAYS  Final   Report Status PENDING  Incomplete  Aerobic/Anaerobic Culture (surgical/deep wound)     Status: None (Preliminary result)   Collection Time: 09/21/16  4:15 PM  Result Value Ref Range Status   Specimen Description ABDOMEN LEFT  Final   Special Requests Normal  Final   Gram Stain   Final    ABUNDANT WBC PRESENT,BOTH PMN AND MONONUCLEAR RARE GRAM NEGATIVE RODS    Culture   Final    FEW ESCHERICHIA COLI NO ANAEROBES ISOLATED; CULTURE IN PROGRESS FOR 5 DAYS    Report Status PENDING  Incomplete   Organism ID, Bacteria ESCHERICHIA COLI  Final      Susceptibility   Escherichia coli - MIC*    AMPICILLIN 8 SENSITIVE Sensitive  CEFAZOLIN <=4 SENSITIVE Sensitive     CEFEPIME <=1 SENSITIVE Sensitive     CEFTAZIDIME <=1 SENSITIVE Sensitive     CEFTRIAXONE <=1 SENSITIVE Sensitive     CIPROFLOXACIN >=4 RESISTANT Resistant     GENTAMICIN <=1 SENSITIVE Sensitive     IMIPENEM <=0.25 SENSITIVE Sensitive     TRIMETH/SULFA <=20 SENSITIVE Sensitive     AMPICILLIN/SULBACTAM 4 SENSITIVE Sensitive     PIP/TAZO <=4 SENSITIVE Sensitive     Extended ESBL NEGATIVE Sensitive     * FEW ESCHERICHIA COLI  Aerobic/Anaerobic Culture (surgical/deep  wound)     Status: None (Preliminary result)   Collection Time: 09/21/16  4:16 PM  Result Value Ref Range Status   Specimen Description ABDOMEN RIGHT  Final   Special Requests Normal  Final   Gram Stain   Final    ABUNDANT WBC PRESENT,BOTH PMN AND MONONUCLEAR NO ORGANISMS SEEN    Culture   Final    NO GROWTH 3 DAYS NO ANAEROBES ISOLATED; CULTURE IN PROGRESS FOR 5 DAYS   Report Status PENDING  Incomplete         Radiology Studies: No results found.      Scheduled Meds: . aspirin  162 mg Oral Daily  . chlorhexidine  15 mL Mouth Rinse BID  . clonazepam  0.5 mg Per Tube BID  . enoxaparin (LOVENOX) injection  40 mg Subcutaneous Q24H  . feeding supplement  237 mL Oral BID BM  . insulin aspart  0-15 Units Subcutaneous TID WC  . pantoprazole sodium  40 mg Per Tube Daily  . sodium chloride flush  10-40 mL Intracatheter Q12H   Continuous Infusions: . anidulafungin Stopped (09/25/16 1205)  . dextrose 75 mL (09/26/16 0101)  . piperacillin-tazobactam (ZOSYN)  IV Stopped (09/26/16 0306)     LOS: 16 days    Time spent: 40 minutes    Anysha Frappier, Geraldo Docker, MD [facility] Pager [phone]   If 7PM-7AM, please contact night-coverage [email or web address] Password [email or web address] 09/26/2016, 8:26 AM

## 2016-09-27 DIAGNOSIS — L0291 Cutaneous abscess, unspecified: Secondary | ICD-10-CM

## 2016-09-27 DIAGNOSIS — K659 Peritonitis, unspecified: Secondary | ICD-10-CM

## 2016-09-27 LAB — BASIC METABOLIC PANEL
ANION GAP: 9 (ref 5–15)
CO2: 25 mmol/L (ref 22–32)
Calcium: 8.9 mg/dL (ref 8.9–10.3)
Chloride: 105 mmol/L (ref 101–111)
Creatinine, Ser: 0.41 mg/dL — ABNORMAL LOW (ref 0.44–1.00)
GFR calc Af Amer: >60 mL/min (ref >60)
GFR calc non Af Amer: >60 mL/min (ref >60)
GLUCOSE: 107 mg/dL — AB (ref 65–99)
Potassium: 4.3 mmol/L (ref 3.5–5.1)
Sodium: 139 mmol/L (ref 135–145)

## 2016-09-27 LAB — CBC
HEMATOCRIT: 33.5 % — AB (ref 36.0–46.0)
Hemoglobin: 10.4 g/dL — ABNORMAL LOW (ref 12.0–15.0)
MCH: 25.6 pg — AB (ref 26.0–34.0)
MCHC: 31 g/dL (ref 30.0–36.0)
MCV: 82.5 fL (ref 78.0–100.0)
PLATELETS: 573 10*3/uL — AB (ref 150–400)
RBC: 4.06 MIL/uL (ref 3.87–5.11)
RDW: 18 % — ABNORMAL HIGH (ref 11.5–15.5)
WBC: 11.9 10*3/uL — ABNORMAL HIGH (ref 4.0–10.5)

## 2016-09-27 LAB — GLUCOSE, CAPILLARY
GLUCOSE-CAPILLARY: 171 mg/dL — AB (ref 65–99)
GLUCOSE-CAPILLARY: 118 mg/dL — AB (ref 65–99)
GLUCOSE-CAPILLARY: 124 mg/dL — AB (ref 65–99)
Glucose-Capillary: 177 mg/dL — ABNORMAL HIGH (ref 65–99)

## 2016-09-27 MED ORDER — PROPRANOLOL HCL 10 MG PO TABS
10.0000 mg | ORAL_TABLET | Freq: Three times a day (TID) | ORAL | Status: DC
  Administered 2016-09-27 – 2016-09-28 (×3): 10 mg via ORAL
  Filled 2016-09-27 (×5): qty 1

## 2016-09-27 MED ORDER — ASPIRIN 81 MG PO CHEW
81.0000 mg | CHEWABLE_TABLET | Freq: Every day | ORAL | Status: DC
  Administered 2016-09-28 – 2016-09-29 (×2): 81 mg via ORAL
  Filled 2016-09-27 (×2): qty 1

## 2016-09-27 MED ORDER — BOOST / RESOURCE BREEZE PO LIQD
1.0000 | Freq: Three times a day (TID) | ORAL | Status: DC
  Administered 2016-09-27 – 2016-09-29 (×2): 1 via ORAL

## 2016-09-27 MED ORDER — ADULT MULTIVITAMIN W/MINERALS CH
1.0000 | ORAL_TABLET | Freq: Every day | ORAL | Status: DC
  Administered 2016-09-27 – 2016-10-01 (×5): 1 via ORAL
  Filled 2016-09-27 (×5): qty 1

## 2016-09-27 MED ORDER — CLONAZEPAM 0.5 MG PO TBDP
0.5000 mg | ORAL_TABLET | Freq: Two times a day (BID) | ORAL | Status: DC
  Administered 2016-09-27 – 2016-09-30 (×6): 0.5 mg via ORAL
  Filled 2016-09-27 (×6): qty 1

## 2016-09-27 NOTE — Progress Notes (Addendum)
Nutrition Follow-up  DOCUMENTATION CODES:   Severe malnutrition in context of acute illness/injury  INTERVENTION:   -D/c Ensure Enlive po BID, each supplement provides 350 kcal and 20 grams of protein, due to poor acceptance -Boost Breeze po TID, each supplement provides 250 kcal and 9 grams of protein -MVI daily  NUTRITION DIAGNOSIS:   Malnutrition (Severe) related to  (SBO w/ perferation s/p SBR) as evidenced by severe fluid accumulation, energy intake < or equal to 50% for > or equal to 5 days.  Ongoing  GOAL:   Patient will meet greater than or equal to 90% of their needs  Progressing  MONITOR:   PO intake, Supplement acceptance, Labs, Weight trends, Skin, I & O's  REASON FOR ASSESSMENT:   Consult Enteral/tube feeding initiation and management  ASSESSMENT:   51 yo female with no PMH who was admitted to [facility] on 6/11 with SBO, s/p emergent laparotomy 6/12 for perforation with peritonitis. She developed ARDS and septic shock after surgery. Transferred to [facility] on 6/15 for further care.  6/30- s/p BSE, advanced to a regular diet with thin liquids, Ensure supplements added by surgical service, NGT removed  Per chart review, plan for abdominal vac dressing change today by surgical service.   Spoke with pt, who was sitting in recliner at time of visit. She reports that she is tolerating solid foods well, but eats very little at baseline. She shares she consumed all of the fruit and bacon at breakfast this AM. She does not like the Ensure supplements, due to disliking the milky taste. We discussed other supplement options and is willing to try Boost Breeze supplements. RD discussed importance of good meal and supplement intake to promote healing.   Labs reviewed: CBGS: 112-171.  Diet Order:  Diet regular Room service appropriate? Yes; Fluid consistency: Thin  Skin:  Wound (see comment) (wound vac to abdominal incision)  Last BM:  09/26/16  Height:   Ht Readings from  Last 1 Encounters:  09/10/16 5\' 5"  (1.651 m)    Weight:   Wt Readings from Last 1 Encounters:  09/27/16 111 lb 8.8 oz (50.6 kg)    Ideal Body Weight:  56.82 kg  BMI:  Body mass index is 18.56 kg/m.  Estimated Nutritional Needs:   Kcal:  1800-2000  Protein:  95-110 gm  Fluid:  1.8-2 L  EDUCATION NEEDS:   Education needs addressed  Elain Wixon A. Jimmye Norman, RD, LDN, CDE Pager: [phone] After hours Pager: [phone]

## 2016-09-27 NOTE — Progress Notes (Signed)
Physical Therapy Treatment Patient Details Name: Brandi Leonard MRN: [id number] DOB: [birth year] Today's Date: 09/27/2016    History of Present Illness Pt adm to [facility] with SBO and perforation/peritonitis and underwent repair on 6/12. Failed extubation post-op and developed ARDS. Transferred to [facility] on 6/15. Extubated 6/21. Pt underwent drainage of abd abcesses on 6/26.  PMH - copd, sbo, anxiety, cholecystitis    PT Comments    Patient progressing well towards PT goals. Tolerated gait training with min guard assist for balance/safety. Pt continues to get tachycardic during mobility with HR up to 142 bpm. Encouraged ambulation 1 more time with RN today. Moving a lot better and maintaining SP02 on RA. Will continue to follow and progress as tolerated.   Follow Up Recommendations  No PT follow up     Equipment Recommendations  Other (comment) (TBA)    Recommendations for Other Services       Precautions / Restrictions Precautions Precautions: Fall Precaution Comments: wound vac Restrictions Weight Bearing Restrictions: No    Mobility  Bed Mobility Overal bed mobility: Needs Assistance Bed Mobility: Sit to Supine       Sit to supine: Supervision   General bed mobility comments: ABle to bring LEs into bed to return to supine without assist. CUes for log roll technique.  Transfers Overall transfer level: Needs assistance Equipment used: Rolling walker (2 wheeled) Transfers: Sit to/from Stand Sit to Stand: Min guard Stand pivot transfers: Min guard       General transfer comment: MIn guard for safety with cues for hand placement/technique. Stood from Automotive engineer. SPT chair to bed.   Ambulation/Gait Ambulation/Gait assistance: Min guard Ambulation Distance (Feet): 170 Feet Assistive device: Rolling walker (2 wheeled) Gait Pattern/deviations: Step-through pattern;Decreased step length - right;Decreased step length - left;Drifts right/left Gait velocity:  decr   General Gait Details: Slow, mildly unsteady gait. HR up to 142 bpm. Sp02 remained >92% on RA.    Stairs            Wheelchair Mobility    Modified Rankin (Stroke Patients Only)       Balance Overall balance assessment: Needs assistance Sitting-balance support: No upper extremity supported;Feet supported Sitting balance-Leahy Scale: Fair     Standing balance support: During functional activity Standing balance-Leahy Scale: Fair Standing balance comment: Able to perform static standing without UE support but does better with UE support using RW for dynamic activities.                             Cognition Arousal/Alertness: Awake/alert Behavior During Therapy: WFL for tasks assessed/performed Overall Cognitive Status: Within Functional Limits for tasks assessed                                        Exercises      General Comments        Pertinent Vitals/Pain Pain Assessment: No/denies pain    Home Living                      Prior Function            PT Goals (current goals can now be found in the care plan section) Progress towards PT goals: Progressing toward goals    Frequency    Min 3X/week      PT Plan Current plan remains  appropriate    Co-evaluation              AM-PAC PT "6 Clicks" Daily Activity  Outcome Measure  Difficulty turning over in bed (including adjusting bedclothes, sheets and blankets)?: None Difficulty moving from lying on back to sitting on the side of the bed? : None Difficulty sitting down on and standing up from a chair with arms (e.g., wheelchair, bedside commode, etc,.)?: None Help needed moving to and from a bed to chair (including a wheelchair)?: A Little Help needed walking in hospital room?: A Little Help needed climbing 3-5 steps with a railing? : A Little 6 Click Score: 21    End of Session Equipment Utilized During Treatment: Gait belt Activity Tolerance:  Patient tolerated treatment well Patient left: in bed;with call bell/phone within reach;with nursing/sitter in room;with SCD's reapplied Nurse Communication: Mobility status PT Visit Diagnosis: Unsteadiness on feet (R26.81);Muscle weakness (generalized) (M62.81);Difficulty in walking, not elsewhere classified (R26.2)     Time: 2162-4469 PT Time Calculation (min) (ACUTE ONLY): 20 min  Charges:  $Gait Training: 8-22 mins                    G Codes:       Wray Kearns, PT, DPT [phone]     Marguarite Arbour A Auria Mckinlay 09/27/2016, 2:51 PM

## 2016-09-27 NOTE — Progress Notes (Signed)
PT Cancellation Note  Patient Details Name: Brandi Leonard MRN: [id number] DOB: 04-Mar-1966   Cancelled Treatment:    Reason Eval/Treat Not Completed: Patient declined, no reason specified Pt reports just getting up not too long ago with RN and wants to wait til this PM. Will follow up as time allows.   Marguarite Arbour A Dennis Killilea 09/27/2016, 12:06 PM Wray Kearns, [place], DPT [phone]

## 2016-09-27 NOTE — Progress Notes (Signed)
  Speech Language Pathology Treatment: Dysphagia  Patient Details Name: FENDI MEINHARDT MRN: [id number] DOB: [birth year] Today's Date: 09/27/2016 Time: 1583-0940 SLP Time Calculation (min) (ACUTE ONLY): 12 min  Assessment / Plan / Recommendation Clinical Impression  Pt demonstrates no signs of ongoing dysphagia. RR stable during meal. Pt consumed 30 oz of thin liquids consecutively without signs of aspiration. Recommend pt continue current diet, no SLP f/u needed will sign off.   HPI HPI: 51 year old admitted to [facility] 6/11 with Small bowel obstruction, underwent emergent laparotomy on 6/12 for perforation with peritonitis. Failed extubation postoperatively and then developed diffuse bilateral infiltrates consistent with ARDS and septic shock requiring Levophed      SLP Plan  All goals met       Recommendations  Diet recommendations: Regular;Thin liquid Liquids provided via: Cup;Straw Medication Administration: Whole meds with liquid                Plan: All goals met       GO                Markan Cazarez, Katherene Ponto 09/27/2016, 8:42 AM

## 2016-09-27 NOTE — Care Management Note (Addendum)
Case Management Note Marvetta Gibbons RN, BSN Unit 2W-Case Manager--2H coverage [phone]  Patient Details  Name: Brandi Leonard MRN: [id number] Date of Birth: [birth year]  Subjective/Objective:  Pt admitted to [facility] with Small bowel obstruction, underwent emergent laparotomy on 6/12 for perforation with peritonitis.  6/15- tx to [facility] with ARDS and septic shock- on vent-  TPN, N[place],                  Action/Plan: PTA pt lived at home- CM to follow for d/c needs  Expected Discharge Date:                  Expected Discharge Plan:     In-House Referral:     Discharge planning Services  CM Consult  Post Acute Care Choice:    Choice offered to:     DME Arranged:    DME Agency:     HH Arranged:    HH Agency:     Status of Service:  In process, will continue to follow  If discussed at Long Length of Stay Meetings, dates discussed:    Discharge Disposition:   Additional Comments: 09/27/2016  Pt remains on RA.  Continues to have wound vac - per surgery PA it has not yet been determined if wound vac will be required in the home (surgery to assess wound today at wound change).  [facility] chosen if dme needed at discharge and application on shadow chart (Surgery NP aware that signature will be required if dme is needed at home) [facility] liaison actively following.  CM also contacted [employer] CM and informed that pt will possibly discharge home with wound vac -  [facility] is accepted by insurance and any local [place] agency that accepts insurance is acceptable.  09/22/16 Pt is now extubated on tube feeds, wound vac, NG tube to suction and IV antibiotics.  Pt may start a diet tomorrow.  PT ordered  09/16/16 Pt transferred to ICU after failingvextubation postoperatively and then developed diffuse bilateral infiltrates consistent with ARDS and septic shock requiring Levophed Maryclare Labrador, RN 09/27/2016, 2:41 PM

## 2016-09-27 NOTE — Progress Notes (Signed)
[facility] Progress Note     Subjective: CC: abdominal pain Patient still with general abdominal soreness. Tolerating diet, denies n/v. Had a BM yesterday, still diarrhea. Trying to get up more but feels weak and tires quickly.   Objective: Vital signs in last 24 hours: Temp:  [98 F (36.7 C)-98.7 F (37.1 C)] 98.5 F (36.9 C) (07/02 0316) Pulse Rate:  [93-110] 101 (07/02 0316) Resp:  [14-30] 19 (07/02 0316) BP: (124-132)/(77-108) 126/87 (07/02 0316) SpO2:  [95 %-100 %] 97 % (07/02 0316) Weight:  [50.6 kg (111 lb 8.8 oz)] 50.6 kg (111 lb 8.8 oz) (07/02 0316) Last BM Date: 09/26/16  Intake/Output from previous day: 07/01 0701 - 07/02 0700 In: 1160 [P.O.:930; IV Piggyback:230] Out: 3500 [Urine:3500] Intake/Output this shift: No intake/output data recorded.  PE: Gen:  Alert, NAD, pleasant Card:  Tachycardia, regular rhythm, no M/G/R appreciated  Pulm:  Normal effort, clear to auscultation bilaterally. Pulling 750 on IS.  Abd: Soft, mild generalized TTP, non-distended, bowel sounds present in all 4 quadrants, no HSM, midline NPWT dressing in place Skin: warm and dry, no rashes  Psych: A&Ox3   Lab Results:   Recent Labs  09/26/16 0348 09/27/16 0346  WBC 9.4 11.9*  HGB 9.8* 10.4*  HCT 31.9* 33.5*  PLT 568* 573*   BMET  Recent Labs  09/26/16 0348 09/27/16 0346  NA 136 139  K 3.4* 4.3  CL 102 105  CO2 25 25  GLUCOSE 162* 107*  BUN <5* <5*  CREATININE 0.52 0.41*  CALCIUM 8.3* 8.9   CMP     Component Value Date/Time   NA 139 09/27/2016 0346   NA 138 03/11/2012 0326   K 4.3 09/27/2016 0346   K 3.6 03/11/2012 0326   CL 105 09/27/2016 0346   CL 106 03/11/2012 0326   CO2 25 09/27/2016 0346   CO2 25 03/11/2012 0326   GLUCOSE 107 (H) 09/27/2016 0346   GLUCOSE 97 03/11/2012 0326   BUN <5 (L) 09/27/2016 0346   BUN 4 (L) 03/11/2012 0326   CREATININE 0.41 (L) 09/27/2016 0346   CREATININE 1.83 (H) 03/15/2012 0449   CALCIUM 8.9 09/27/2016 0346   CALCIUM 8.1 (L) 03/11/2012 0326   PROT 6.0 (L) 09/25/2016 1228   PROT 8.1 03/05/2012 1834   ALBUMIN 2.0 (L) 09/25/2016 1228   ALBUMIN 4.7 03/05/2012 1834   AST 21 09/25/2016 1228   AST 19 03/05/2012 1834   ALT 32 09/25/2016 1228   ALT 20 03/05/2012 1834   ALKPHOS 204 (H) 09/25/2016 1228   ALKPHOS 146 (H) 03/05/2012 1834   BILITOT 0.3 09/25/2016 1228   BILITOT 0.6 03/05/2012 1834   GFRNONAA >60 09/27/2016 0346   GFRNONAA 33 (L) 03/15/2012 0449   GFRAA >60 09/27/2016 0346   GFRAA 38 (L) 03/15/2012 0449   Lipase     Component Value Date/Time   LIPASE 110 (H) 09/20/2016 0125   LIPASE 69 (L) 03/05/2012 1834    Anti-infectives: Anti-infectives    Start     Dose/Rate Route Frequency Ordered Stop   09/25/16 1600  piperacillin-tazobactam (ZOSYN) IVPB 3.375 g     3.375 g 12.5 mL/hr over 240 Minutes Intravenous Every 8 hours 09/25/16 1409     09/21/16 2230  vancomycin (VANCOCIN) IVPB 1000 mg/200 mL premix  Status:  Discontinued     1,000 mg 200 mL/hr over 60 Minutes Intravenous Every 8 hours 09/21/16 2210 09/22/16 1102   09/21/16 1100  anidulafungin (ERAXIS) 100 mg in sodium chloride 0.9 %  100 mL IVPB     100 mg 78 mL/hr over 100 Minutes Intravenous Every 24 hours 09/20/16 1048     09/20/16 1100  anidulafungin (ERAXIS) 200 mg in sodium chloride 0.9 % 200 mL IVPB     200 mg 78 mL/hr over 200 Minutes Intravenous  Once 09/20/16 1048 09/20/16 1510   09/20/16 0145  vancomycin (VANCOCIN) IVPB 750 mg/150 ml premix  Status:  Discontinued     750 mg 150 mL/hr over 60 Minutes Intravenous Every 12 hours 09/20/16 0135 09/21/16 2210   09/19/16 0800  meropenem (MERREM) 1 g in sodium chloride 0.9 % 100 mL IVPB  Status:  Discontinued     1 g 200 mL/hr over 30 Minutes Intravenous Every 8 hours 09/19/16 0738 09/25/16 1409   09/11/16 1300  metroNIDAZOLE (FLAGYL) IVPB 500 mg  Status:  Discontinued     500 mg 100 mL/hr over 60 Minutes Intravenous Every 8 hours 09/11/16 1208 09/16/16 0912   09/11/16  1300  meropenem (MERREM) 1 g in sodium chloride 0.9 % 100 mL IVPB  Status:  Discontinued     1 g 200 mL/hr over 30 Minutes Intravenous Every 8 hours 09/11/16 1259 09/16/16 0912       Assessment/Plan Sepsis ARDS - extubated, management per CCM Severe protein calorie malnutrition  Seizure  SBO with perforation S/P Ex lap, 09/07/16, [facility] - Dr. Brantley Stage - continue midline wound vac, changes MWF - continue diet  Abdominal fluid collections - s/p CT guided drainage of small bilateral abd abscesses 6/26 - repeat CT abdomen/pelvis this week, continue abx until at least CT - WBC 11.9 which is increased from 9.4 yesterday, mild tachycardia, patient afebrile overnight. - patient will need f/u pelvic US and GYN f/u once she is over her acute illness  FEN- regular diet, potassium 4.3. Encourage PO pain control VTE - SCDs, lovenox ID - meropenem (6/16 > 6/21; 6/24>6/30), flagyl (6/16>6/21), vancomycin (6/25>6/27), anadulafungin (6/25>>), zosyn (6/30>>)  Plan: continue diet, continue VAC MWF (please page me for dressing change today). Continue abx, repeat CT some time this week. Patient will need GYN f/u at discharge.   LOS: 17 days    Brigid Re , [facility] Surgery 09/27/2016, 7:13 AM Pager: [phone] Consults: [phone] Mon-Fri 7:00 am-4:30 pm Sat-Sun 7:00 am-11:30 am

## 2016-09-27 NOTE — Progress Notes (Signed)
[facility] TEAM 1 - Stepdown/ICU TEAM  Erricka Falkner Beattie  MWU:[id number] DOB: 1965-05-11 DOA: 09/10/2016 PCP: No primary care provider on file.    Brief Narrative:  51yo F w/ no known med hx who was originally admitted to [facility] 6/11 with a small bowel obstruction.  She underwent emergent laparotomy on 6/12 for perforation with peritonitis, but failed extubation postoperatively and then developed diffuse bilateral infiltrates consistent with ARDS and septic shock requiring Levophed.  She was able to be extubated 6/21, but on 6/25 developed worsening respiratory failure, ?aspiration v HCAP.    Subjective: The patient is alert and oriented.  She reports she is feeling much better.  She admits to feeling very weak in general.  She denies abdominal pain chest pain or shortness of breath at the time of visit.  Assessment & Plan:  Acute hypoxic respiratory failure - aspiration pneumonia - ARDS Resolved  Small bowel obstruction with perforation - septic shock Gen Surgery is addressing this issue - improving presently   Abdominal fluid collections s/p CT guided drainage of small bilateral abd abscesses 6/26 - care as suggested by Gen Surgery   Cardiogenic shock - demand ischemia Resolved - due to severe sepsis in setting of peritonitis  Acute diastolic congestive heart failure Clinically stable w/ no evidence of volume overload on exam  Filed Weights   09/25/16 0433 09/26/16 0348 09/27/16 0316  Weight: 54 kg (119 lb 0.8 oz) 52.3 kg (115 lb 4.8 oz) 50.6 kg (111 lb 8.8 oz)    Shock liver Resolved   Severe protein calorie malnutrition Intake improving - Nutrition following  Anemia of critical illness Hgb trending upward w/ recovery   Relative adrenal insufficiency Cortisol 12 in midst of acute critical illness - not presently requiring steroid tx   Seizure - benzodiazepine withdrawal Stable on low dose klonopin   DVT prophylaxis: lovenox  Code Status: FULL CODE Family  Communication: no family present at time of exam  Disposition Plan:   Consultants:  General surgery Critical care   Objective: Blood pressure 124/85, pulse (!) 103, temperature 98.4 F (36.9 C), temperature source Oral, resp. rate 18, height 5\' 5"  (1.651 m), weight 50.6 kg (111 lb 8.8 oz), last menstrual period 09/16/2016, SpO2 95 %.  Intake/Output Summary (Last 24 hours) at 09/27/16 1215 Last data filed at 09/27/16 0326  Gross per 24 hour  Intake             1160 ml  Output             2600 ml  Net            -1440 ml   Filed Weights   09/25/16 0433 09/26/16 0348 09/27/16 0316  Weight: 54 kg (119 lb 0.8 oz) 52.3 kg (115 lb 4.8 oz) 50.6 kg (111 lb 8.8 oz)    Examination: General: No acute respiratory distress Lungs: Clear to auscultation bilaterally without wheezes or crackles Cardiovascular: Regular rate and rhythm without murmur gallop or rub normal S1 and S2 Abdomen: Nondistended, soft, bowel sounds positive, no rebound Extremities: No significant cyanosis, clubbing, or edema bilateral lower extremities  CBC:  Recent Labs Lab 09/21/16 2135 09/22/16 0530 09/23/16 0430 09/24/16 0553 09/25/16 1228 09/26/16 0348 09/27/16 0346  WBC 14.9* 14.7* 10.4 10.6* 10.1 9.4 11.9*  NEUTROABS 12.6* 12.4* 8.7* 9.0* 7.9*  --   --   HGB 7.9* 7.7* 8.0* 9.1* 10.8* 9.8* 10.4*  HCT 26.5* 26.0* 27.0* 30.1* 35.2* 31.9* 33.5*  MCV 82.3 83.1 82.3 82.7  81.1 81.8 82.5  PLT 633* 659* 624* 624* 633* 568* 694*   Basic Metabolic Panel:  Recent Labs Lab 09/21/16 0250  09/21/16 2135 09/22/16 0530  09/23/16 0430 09/24/16 0553 09/25/16 1228 09/26/16 0348 09/27/16 0346  NA 149*  < > 147* 148*  < > 141 137 138 136 139  K 2.5*  < > 3.9 3.8  < > 3.1* 3.5 3.0* 3.4* 4.3  CL 114*  < > 113* 111  < > 104 101 100* 102 105  CO2 28  < > 29 30  < > 30 28 27 25 25   GLUCOSE 152*  < > 115* 103*  < > 108* 114* 131* 162* 107*  BUN 22*  < > 17 15  < > 11 6 <5* <5* <5*  CREATININE 0.45  < > 0.42* 0.45  <  > 0.36* 0.38* 0.42* 0.52 0.41*  CALCIUM 7.8*  < > 7.6* 7.9*  < > 7.9* 8.1* 8.4* 8.3* 8.9  MG 1.6*  --  2.3 2.1  --  1.8 1.9 1.8  --   --   PHOS 3.1  --   --  3.9  --   --  3.5  --   --   --   < > = values in this interval not displayed. GFR: Estimated Creatinine Clearance: 68.7 mL/min (A) (by C-G formula based on SCr of 0.41 mg/dL (L)).  Liver Function Tests:  Recent Labs Lab 09/21/16 0250 09/21/16 2135 09/22/16 0530 09/23/16 0430 09/25/16 1228  AST 57* 41 28 17 21   ALT 84* 85* 71* 44 32  ALKPHOS 252* 190* 175* 127* 204*  BILITOT 0.3 0.3 0.1* 0.3 0.3  PROT 4.9* 4.8* 4.7* 4.5* 6.0*  ALBUMIN 1.8* 1.7* 1.6* 1.5* 2.0*   CBG:  Recent Labs Lab 09/26/16 0836 09/26/16 1256 09/26/16 1722 09/26/16 2118 09/27/16 0941  GLUCAP 135* 112* 138* 165* 171*    Recent Results (from the past 240 hour(s))  C difficile quick scan w PCR reflex     Status: None   Collection Time: 09/17/16  5:25 PM  Result Value Ref Range Status   C Diff antigen NEGATIVE NEGATIVE Final   C Diff toxin NEGATIVE NEGATIVE Final   C Diff interpretation No C. difficile detected.  Final  Culture, blood (Routine X 2) w Reflex to ID Panel     Status: None   Collection Time: 09/19/16  8:18 AM  Result Value Ref Range Status   Specimen Description BLOOD LEFT HAND  Final   Special Requests IN PEDIATRIC BOTTLE Blood Culture adequate volume  Final   Culture NO GROWTH 5 DAYS  Final   Report Status 09/24/2016 FINAL  Final  Culture, blood (Routine X 2) w Reflex to ID Panel     Status: None   Collection Time: 09/19/16  8:20 AM  Result Value Ref Range Status   Specimen Description BLOOD RIGHT HAND  Final   Special Requests   Final    BOTTLES DRAWN AEROBIC AND ANAEROBIC Blood Culture adequate volume   Culture NO GROWTH 5 DAYS  Final   Report Status 09/24/2016 FINAL  Final  Fungus culture, blood     Status: None   Collection Time: 09/20/16  3:00 AM  Result Value Ref Range Status   Specimen Description BLOOD BLOOD LEFT  FOREARM  Final   Special Requests   Final    BOTTLES DRAWN AEROBIC ONLY Blood Culture adequate volume   Culture NO GROWTH 5 DAYS  Final  Report Status 09/26/2016 FINAL  Final  Aerobic/Anaerobic Culture (surgical/deep wound)     Status: None   Collection Time: 09/21/16  4:15 PM  Result Value Ref Range Status   Specimen Description ABDOMEN LEFT  Final   Special Requests Normal  Final   Gram Stain   Final    ABUNDANT WBC PRESENT,BOTH PMN AND MONONUCLEAR RARE GRAM NEGATIVE RODS    Culture FEW ESCHERICHIA COLI NO ANAEROBES ISOLATED   Final   Report Status 09/26/2016 FINAL  Final   Organism ID, Bacteria ESCHERICHIA COLI  Final      Susceptibility   Escherichia coli - MIC*    AMPICILLIN 8 SENSITIVE Sensitive     CEFAZOLIN <=4 SENSITIVE Sensitive     CEFEPIME <=1 SENSITIVE Sensitive     CEFTAZIDIME <=1 SENSITIVE Sensitive     CEFTRIAXONE <=1 SENSITIVE Sensitive     CIPROFLOXACIN >=4 RESISTANT Resistant     GENTAMICIN <=1 SENSITIVE Sensitive     IMIPENEM <=0.25 SENSITIVE Sensitive     TRIMETH/SULFA <=20 SENSITIVE Sensitive     AMPICILLIN/SULBACTAM 4 SENSITIVE Sensitive     PIP/TAZO <=4 SENSITIVE Sensitive     Extended ESBL NEGATIVE Sensitive     * FEW ESCHERICHIA COLI  Aerobic/Anaerobic Culture (surgical/deep wound)     Status: None   Collection Time: 09/21/16  4:16 PM  Result Value Ref Range Status   Specimen Description ABDOMEN RIGHT  Final   Special Requests Normal  Final   Gram Stain   Final    ABUNDANT WBC PRESENT,BOTH PMN AND MONONUCLEAR NO ORGANISMS SEEN    Culture No growth aerobically or anaerobically.  Final   Report Status 09/26/2016 FINAL  Final     Scheduled Meds: . aspirin  162 mg Oral Daily  . chlorhexidine  15 mL Mouth Rinse BID  . clonazepam  0.5 mg Per Tube BID  . docusate sodium  100 mg Oral BID  . enoxaparin (LOVENOX) injection  40 mg Subcutaneous Q24H  . feeding supplement  1 Container Oral TID BM  . insulin aspart  0-15 Units Subcutaneous TID WC  .  multivitamin with minerals  1 tablet Oral Daily  . pantoprazole sodium  40 mg Per Tube Daily  . sodium chloride flush  10-40 mL Intracatheter Q12H     LOS: 17 days   Cherene Altes, MD [facility] Office  [phone] Pager - Text Page per Amion as per below:  On-Call/Text Page:      [email or web address]      password [email or web address]  If 7PM-7AM, please contact night-coverage [email or web address] Password [facility] 09/27/2016, 12:15 PM

## 2016-09-28 DIAGNOSIS — E876 Hypokalemia: Secondary | ICD-10-CM

## 2016-09-28 DIAGNOSIS — K631 Perforation of intestine (nontraumatic): Secondary | ICD-10-CM

## 2016-09-28 LAB — BASIC METABOLIC PANEL
ANION GAP: 13 (ref 5–15)
BUN: 6 mg/dL (ref 6–20)
CALCIUM: 8.8 mg/dL — AB (ref 8.9–10.3)
CO2: 23 mmol/L (ref 22–32)
Chloride: 101 mmol/L (ref 101–111)
Creatinine, Ser: 0.45 mg/dL (ref 0.44–1.00)
GFR calc Af Amer: >60 mL/min (ref >60)
Glucose, Bld: 98 mg/dL (ref 65–99)
POTASSIUM: 3.7 mmol/L (ref 3.5–5.1)
SODIUM: 137 mmol/L (ref 135–145)

## 2016-09-28 LAB — GLUCOSE, CAPILLARY
GLUCOSE-CAPILLARY: 103 mg/dL — AB (ref 65–99)
GLUCOSE-CAPILLARY: 195 mg/dL — AB (ref 65–99)
Glucose-Capillary: 161 mg/dL — ABNORMAL HIGH (ref 65–99)
Glucose-Capillary: 126 mg/dL — ABNORMAL HIGH (ref 65–99)

## 2016-09-28 LAB — TSH: TSH: 3.544 u[IU]/mL (ref 0.350–4.500)

## 2016-09-28 MED ORDER — PROPRANOLOL HCL 10 MG PO TABS
15.0000 mg | ORAL_TABLET | Freq: Three times a day (TID) | ORAL | Status: AC
  Administered 2016-09-28 – 2016-09-29 (×4): 15 mg via ORAL
  Filled 2016-09-28 (×4): qty 1.5

## 2016-09-28 NOTE — Progress Notes (Signed)
PROGRESS NOTE    Brandi Leonard  GBT:[id number] DOB: [birth year] DOA: 09/10/2016 PCP: No primary care provider on file.   Brief Narrative:  51 year old WF PMHx none on file   Admitted to [facility] 6/11 with Small bowel obstruction, underwent emergent laparotomy on 6/12 for perforation with peritonitis. Failed extubation postoperatively and then developed diffuse bilateral infiltrates consistent with ARDS and septic shock requiring Levophed.  Extubated 6/21.  On 6/25 developed worsening respiratory failure, ?aspiration v HCAP, large volume stool output.  PCCM reconsulted   Subjective: 7/3  A/O 4, negative SOB, negative CP, positive mild abdominal pain, negative N/V, negative postprandial abdominal pain or nausea, laying comfortably in bed. States has not been out of bed except to bathroom today.    Assessment & Plan:   Principal Problem:   Septic shock ([name]) Active Problems:   Encounter for central line placement   ARDS (adult respiratory distress syndrome) (HCC)   Diarrhea   Severe protein-calorie malnutrition (HCC)   Leukocytosis   Peritonitis (HCC)   Hypernatremia   Thrombocytosis (HCC)    Respiratory failure with hypoxia /Aspiration PNA/ ARDS (extubated 6/21) -Resolved -Titrate O2 to maintain SPO2> 93% -Patient currently on antibiotics for SBO with perforation would also cover aspiration pneumonia.  COPD without acute exacerbation -Atrovent and Xopenex PRN   Cardiogenic Shock/Demand ischemia   -likely septic due to peritonitis - resolved.  -Patient currently asymptomatic  Acute diastolic CHF -Strict I&O since admission - 6.3 L -Daily weight Filed Weights   09/26/16 0348 09/27/16 0316 09/28/16 0344  Weight: 115 lb 4.8 oz (52.3 kg) 111 lb 8.8 oz (50.6 kg) 112 lb 3.4 oz (50.9 kg)  -7/3 increase Propranolol 15 mg TID -Transfuse for hemoglobin<8  SBO with perforation/Septic Shock  -complicated by perforation now s/p repair 6/12; c/b post op abd pain, colitis  and what appears to be intra-abd abscess by CT scan 6/25 -Cont wound vac per surgeons (MWF) -S/p CT guided drainage of small bilateral abd abscesses 6/26 -Surgery unsure as to how long/type antibiotics to keep patient on. Consult ID on 7/4. Home with IV antibiotics vs PO antibiotics vs DCM biotics and watchful waiting.  -Will advance CT abdomen and pelvis to Wednesday 7/4 I/O surgery to evaluate sooner.   Shock liver  -Trend LFTs  Severe protein cal malnutrition -Tolerating regular diet  Large volume stool output  - C.Diff neg   Anemia of critical illness.  -No evidence of acute bleeding  Recent Labs Lab 09/22/16 0530 09/23/16 0430 09/24/16 0553 09/25/16 1228 09/26/16 0348 09/27/16 0346  HGB 7.7* 8.0* 9.1* 10.8* 9.8* 10.4*  -Stable   Thrombocytosis  -most likely reactive, stable   Relative Adrenal insufficiency (cortisol 12 at [place])  -Resolved -Continue moderate SSI  Acute metabolic encephalopathy -resolved  Seizure - ?benzo withdrawal  -Cont Clonazepam as ODT (on reduced dosing from home and doing well) -Morphine PRN   -Watch for seizure  Hypokalemia -Potassium goal>4  Hypomagnesemia -Magnessium goal>2    DVT prophylaxis: Lovenox Code Status: Full Family Communication: None Disposition Plan: Per surgery   Consultants:  PC CM CCS   Procedures/Significant Events:  6/11 admit for SBO 6/11 CT abdomen  > multiple dilated loops of fluid filled small bowel consistent with mechanical small bowel obstruction 6/12 perf to OR for repair 6/13 contnue shock, resp failure 6/15 transfer to [facility] for ICU admit. 6/16 Echo  >>LVEF= 50% to 55%.  -(grade 1 diastolic dysfunction).  - Pericardium, extracardiac: There is a large left pleural effusion.  6/24 CTA  chest >>> Neg PE, L>R bilat effusions, LLL consolidation with air bronchograms, RML volume loss  BLE venous doppler 6/25>>> neg   6/25 CT Abd : Colon is diffusely dilated with fluid despite having a  rectal tube. No significant small bowel dilatation.There are two fluid collections in the pelvis which were likely present prior to the recent surgery. The largest is in the left lower quadrant measuring over 7 cm and there is a smaller complex collection in the right anterior pelvic region measuring 4.4 cm. These could be adnexal cystic structures but indeterminate. Ideally these areas could be further characterized with a pelvic ultrasound but may be difficult with the fluid-filled colon. There are two new postoperative fluid collections along the lateral abdomen. These could represent pockets of loculated ascites or abscess collections. Trace left pleural fluid with consolidation or volume loss in the left lower lobe. Volume loss in the right lower lobe. 6/25- worsening resp status 6/26 IR drainage pus on left but NO drain required      VENTILATOR SETTINGS: None   Cultures Blood 6/12 > Coag neg staph 1/2 bottles BC x 2 6/15>>> NEG Sputum 6/15>> few candida CDiff 6/22>>> neg  BC x 2 6/24>>>  Surgical wound 6/26: rare GNR>>>   Antimicrobials: Anti-infectives    Start     Stop   09/21/16 2230  vancomycin (VANCOCIN) IVPB 1000 mg/200 mL premix  Status:  Discontinued     09/22/16 1102   09/21/16 1100  anidulafungin (ERAXIS) 100 mg in sodium chloride 0.9 % 100 mL IVPB         09/20/16 1100  anidulafungin (ERAXIS) 200 mg in sodium chloride 0.9 % 200 mL IVPB     09/20/16 1510   09/20/16 0145  vancomycin (VANCOCIN) IVPB 750 mg/150 ml premix  Status:  Discontinued     09/21/16 2210   09/19/16 0800  meropenem (MERREM) 1 g in sodium chloride 0.9 % 100 mL IVPB         09/11/16 1300  metroNIDAZOLE (FLAGYL) IVPB 500 mg  Status:  Discontinued     09/16/16 0912   09/11/16 1300  meropenem (MERREM) 1 g in sodium chloride 0.9 % 100 mL IVPB  Status:  Discontinued     09/16/16 0912       Devices    LINES / TUBES:  ETT 6/13 >6/21 R fem CVL 6/12 >6/15 Art line rt radial 6/15 > 6/21 IJ  CVL 6/15>>>7/1    Continuous Infusions: . anidulafungin Stopped (09/27/16 1527)  . piperacillin-tazobactam (ZOSYN)  IV Stopped (09/28/16 0411)     Objective: Vitals:   09/28/16 0011 09/28/16 0344 09/28/16 0358 09/28/16 0853  BP: 123/86  126/83 (!) 127/93  Pulse: 97 94 90 (!) 104  Resp: 19 16 16 18   Temp: 98.2 F (36.8 C)  98.1 F (36.7 C) 98.9 F (37.2 C)  TempSrc: Oral Oral Oral Oral  SpO2: 95% 96% 96% 97%  Weight:  112 lb 3.4 oz (50.9 kg)    Height:        Intake/Output Summary (Last 24 hours) at 09/28/16 0859 Last data filed at 09/28/16 0411  Gross per 24 hour  Intake              240 ml  Output             1250 ml  Net            -1010 ml   Filed Weights   09/26/16 0348 09/27/16 0316 09/28/16 0344  Weight: 115 lb 4.8 oz (52.3 kg) 111 lb 8.8 oz (50.6 kg) 112 lb 3.4 oz (50.9 kg)    Examination:  General: A/O 4 No acute respiratory distress, cachectic Eyes: negative scleral hemorrhage, negative anisocoria, negative icterus ENT: Negative Runny nose, negative gingival bleeding, Neck:  Negative scars, masses, torticollis, lymphadenopathy, JVD Lungs: Clear to auscultation bilaterally without wheezes or crackles Cardiovascular: Tachycardic, Regular rhythm without murmur gallop or rub normal S1 and S2 Abdomen: Positive abdominal pain greatest LUQ/LLQ( Improving), nondistended, negative bowel sounds, no rebound, no ascites, no appreciable mass, wound VAC in place draining serosanguineous fluid Extremities: No significant cyanosis, clubbing, or edema bilateral lower extremities Skin: Negative rashes, lesions, ulcers Psychiatric:  Negative depression, negative anxiety, negative fatigue, negative mania  Central nervous system:  Cranial nerves II through XII intact, tongue/uvula midline, all extremities muscle strength 5/5, sensation intact throughout, negative dysarthria, negative expressive aphasia, negative receptive aphasia.  .     Data Reviewed: Care during the  described time interval was provided by me .  I have reviewed this patient's available data, including medical history, events of note, physical examination, and all test results as part of my evaluation. I have personally reviewed and interpreted all radiology studies.  CBC:  Recent Labs Lab 09/21/16 2135 09/22/16 0530 09/23/16 0430 09/24/16 0553 09/25/16 1228 09/26/16 0348 09/27/16 0346  WBC 14.9* 14.7* 10.4 10.6* 10.1 9.4 11.9*  NEUTROABS 12.6* 12.4* 8.7* 9.0* 7.9*  --   --   HGB 7.9* 7.7* 8.0* 9.1* 10.8* 9.8* 10.4*  HCT 26.5* 26.0* 27.0* 30.1* 35.2* 31.9* 33.5*  MCV 82.3 83.1 82.3 82.7 81.1 81.8 82.5  PLT 633* 659* 624* 624* 633* 568* 300*   Basic Metabolic Panel:  Recent Labs Lab 09/21/16 2135 09/22/16 0530  09/23/16 0430 09/24/16 0553 09/25/16 1228 09/26/16 0348 09/27/16 0346 09/28/16 0622  NA 147* 148*  < > 141 137 138 136 139 137  K 3.9 3.8  < > 3.1* 3.5 3.0* 3.4* 4.3 3.7  CL 113* 111  < > 104 101 100* 102 105 101  CO2 29 30  < > 30 28 27 25 25 23   GLUCOSE 115* 103*  < > 108* 114* 131* 162* 107* 98  BUN 17 15  < > 11 6 <5* <5* <5* 6  CREATININE 0.42* 0.45  < > 0.36* 0.38* 0.42* 0.52 0.41* 0.45  CALCIUM 7.6* 7.9*  < > 7.9* 8.1* 8.4* 8.3* 8.9 8.8*  MG 2.3 2.1  --  1.8 1.9 1.8  --   --   --   PHOS  --  3.9  --   --  3.5  --   --   --   --   < > = values in this interval not displayed. GFR: Estimated Creatinine Clearance: 69.1 mL/min (by C-G formula based on SCr of 0.45 mg/dL). Liver Function Tests:  Recent Labs Lab 09/21/16 2135 09/22/16 0530 09/23/16 0430 09/25/16 1228  AST 41 28 17 21   ALT 85* 71* 44 32  ALKPHOS 190* 175* 127* 204*  BILITOT 0.3 0.1* 0.3 0.3  PROT 4.8* 4.7* 4.5* 6.0*  ALBUMIN 1.7* 1.6* 1.5* 2.0*   No results for input(s): LIPASE, AMYLASE in the last 168 hours. No results for input(s): AMMONIA in the last 168 hours. Coagulation Profile: No results for input(s): INR, PROTIME in the last 168 hours. Cardiac Enzymes: No results for  input(s): CKTOTAL, CKMB, CKMBINDEX, TROPONINI in the last 168 hours. BNP (last 3 results) No results for input(s): PROBNP in  the last 8760 hours. HbA1C: No results for input(s): HGBA1C in the last 72 hours. CBG:  Recent Labs Lab 09/27/16 0941 09/27/16 1244 09/27/16 1642 09/27/16 2139 09/28/16 0822  GLUCAP 171* 118* 177* 124* 103*   Lipid Profile: No results for input(s): CHOL, HDL, LDLCALC, TRIG, CHOLHDL, LDLDIRECT in the last 72 hours. Thyroid Function Tests:  Recent Labs  09/28/16 0622  TSH 3.544   Anemia Panel: No results for input(s): VITAMINB12, FOLATE, FERRITIN, TIBC, IRON, RETICCTPCT in the last 72 hours. Urine analysis:    Component Value Date/Time   COLORURINE YELLOW 09/10/2016 1522   APPEARANCEUR CLEAR 09/10/2016 1522   APPEARANCEUR Cloudy 03/05/2012 1834   LABSPEC 1.016 09/10/2016 1522   LABSPEC 1.032 03/05/2012 1834   PHURINE 6.0 09/10/2016 1522   GLUCOSEU 150 (A) 09/10/2016 1522   GLUCOSEU Negative 03/05/2012 1834   HGBUR SMALL (A) 09/10/2016 1522   BILIRUBINUR NEGATIVE 09/10/2016 1522   BILIRUBINUR Negative 03/05/2012 1834   KETONESUR 5 (A) 09/10/2016 1522   PROTEINUR NEGATIVE 09/10/2016 1522   NITRITE NEGATIVE 09/10/2016 1522   LEUKOCYTESUR TRACE (A) 09/10/2016 1522   LEUKOCYTESUR Negative 03/05/2012 1834   Sepsis Labs: @LABRCNTIP (procalcitonin:4,lacticidven:4)  ) Recent Results (from the past 240 hour(s))  Culture, blood (Routine X 2) w Reflex to ID Panel     Status: None   Collection Time: 09/19/16  8:18 AM  Result Value Ref Range Status   Specimen Description BLOOD LEFT HAND  Final   Special Requests IN PEDIATRIC BOTTLE Blood Culture adequate volume  Final   Culture NO GROWTH 5 DAYS  Final   Report Status 09/24/2016 FINAL  Final  Culture, blood (Routine X 2) w Reflex to ID Panel     Status: None   Collection Time: 09/19/16  8:20 AM  Result Value Ref Range Status   Specimen Description BLOOD RIGHT HAND  Final   Special Requests   Final      BOTTLES DRAWN AEROBIC AND ANAEROBIC Blood Culture adequate volume   Culture NO GROWTH 5 DAYS  Final   Report Status 09/24/2016 FINAL  Final  Fungus culture, blood     Status: None   Collection Time: 09/20/16  3:00 AM  Result Value Ref Range Status   Specimen Description BLOOD BLOOD LEFT FOREARM  Final   Special Requests   Final    BOTTLES DRAWN AEROBIC ONLY Blood Culture adequate volume   Culture NO GROWTH 5 DAYS  Final   Report Status 09/26/2016 FINAL  Final  Aerobic/Anaerobic Culture (surgical/deep wound)     Status: None   Collection Time: 09/21/16  4:15 PM  Result Value Ref Range Status   Specimen Description ABDOMEN LEFT  Final   Special Requests Normal  Final   Gram Stain   Final    ABUNDANT WBC PRESENT,BOTH PMN AND MONONUCLEAR RARE GRAM NEGATIVE RODS    Culture FEW ESCHERICHIA COLI NO ANAEROBES ISOLATED   Final   Report Status 09/26/2016 FINAL  Final   Organism ID, Bacteria ESCHERICHIA COLI  Final      Susceptibility   Escherichia coli - MIC*    AMPICILLIN 8 SENSITIVE Sensitive     CEFAZOLIN <=4 SENSITIVE Sensitive     CEFEPIME <=1 SENSITIVE Sensitive     CEFTAZIDIME <=1 SENSITIVE Sensitive     CEFTRIAXONE <=1 SENSITIVE Sensitive     CIPROFLOXACIN >=4 RESISTANT Resistant     GENTAMICIN <=1 SENSITIVE Sensitive     IMIPENEM <=0.25 SENSITIVE Sensitive     TRIMETH/SULFA <=20 SENSITIVE Sensitive  AMPICILLIN/SULBACTAM 4 SENSITIVE Sensitive     PIP/TAZO <=4 SENSITIVE Sensitive     Extended ESBL NEGATIVE Sensitive     * FEW ESCHERICHIA COLI  Aerobic/Anaerobic Culture (surgical/deep wound)     Status: None   Collection Time: 09/21/16  4:16 PM  Result Value Ref Range Status   Specimen Description ABDOMEN RIGHT  Final   Special Requests Normal  Final   Gram Stain   Final    ABUNDANT WBC PRESENT,BOTH PMN AND MONONUCLEAR NO ORGANISMS SEEN    Culture No growth aerobically or anaerobically.  Final   Report Status 09/26/2016 FINAL  Final         Radiology  Studies: No results found.      Scheduled Meds: . aspirin  81 mg Oral Daily  . chlorhexidine  15 mL Mouth Rinse BID  . clonazePAM  0.5 mg Oral BID  . docusate sodium  100 mg Oral BID  . enoxaparin (LOVENOX) injection  40 mg Subcutaneous Q24H  . feeding supplement  1 Container Oral TID BM  . insulin aspart  0-15 Units Subcutaneous TID WC  . multivitamin with minerals  1 tablet Oral Daily  . propranolol  10 mg Oral TID  . sodium chloride flush  10-40 mL Intracatheter Q12H   Continuous Infusions: . anidulafungin Stopped (09/27/16 1527)  . piperacillin-tazobactam (ZOSYN)  IV Stopped (09/28/16 0411)     LOS: 18 days    Time spent: 40 minutes    WOODS, Geraldo Docker, MD [facility] Pager [phone]   If 7PM-7AM, please contact night-coverage [email or web address] Password [email or web address] 09/28/2016, 8:59 AM

## 2016-09-28 NOTE — Progress Notes (Signed)
Subjective/Chief Complaint: LOOKS GOOD  Vac changed yesterday but surgery not called    Objective: Vital signs in last 24 hours: Temp:  [98 F (36.7 C)-98.9 F (37.2 C)] 98.9 F (37.2 C) (07/03 0853) Pulse Rate:  [90-120] 104 (07/03 0853) Resp:  [16-23] 18 (07/03 0853) BP: (122-127)/(83-93) 127/93 (07/03 0853) SpO2:  [95 %-99 %] 97 % (07/03 0853) Weight:  [50.9 kg (112 lb 3.4 oz)] 50.9 kg (112 lb 3.4 oz) (07/03 0344) Last BM Date: 09/27/16  Intake/Output from previous day: 07/02 0701 - 07/03 0700 In: 240 [P.O.:240] Out: 1250 [Urine:1250] Intake/Output this shift: Total I/O In: 240 [P.O.:240] Out: -   Incision/Wound:incision intact with vac  Soft ND   Lab Results:   Recent Labs  09/26/16 0348 09/27/16 0346  WBC 9.4 11.9*  HGB 9.8* 10.4*  HCT 31.9* 33.5*  PLT 568* 573*   BMET  Recent Labs  09/27/16 0346 09/28/16 0622  NA 139 137  K 4.3 3.7  CL 105 101  CO2 25 23  GLUCOSE 107* 98  BUN <5* 6  CREATININE 0.41* 0.45  CALCIUM 8.9 8.8*   PT/INR No results for input(s): LABPROT, INR in the last 72 hours. ABG No results for input(s): PHART, HCO3 in the last 72 hours.  Invalid input(s): PCO2, PO2  Studies/Results: No results found.  Anti-infectives: Anti-infectives    Start     Dose/Rate Route Frequency Ordered Stop   09/25/16 1600  piperacillin-tazobactam (ZOSYN) IVPB 3.375 g     3.375 g 12.5 mL/hr over 240 Minutes Intravenous Every 8 hours 09/25/16 1409     09/21/16 2230  vancomycin (VANCOCIN) IVPB 1000 mg/200 mL premix  Status:  Discontinued     1,000 mg 200 mL/hr over 60 Minutes Intravenous Every 8 hours 09/21/16 2210 09/22/16 1102   09/21/16 1100  anidulafungin (ERAXIS) 100 mg in sodium chloride 0.9 % 100 mL IVPB     100 mg 78 mL/hr over 100 Minutes Intravenous Every 24 hours 09/20/16 1048     09/20/16 1100  anidulafungin (ERAXIS) 200 mg in sodium chloride 0.9 % 200 mL IVPB     200 mg 78 mL/hr over 200 Minutes Intravenous  Once 09/20/16  1048 09/20/16 1510   09/20/16 0145  vancomycin (VANCOCIN) IVPB 750 mg/150 ml premix  Status:  Discontinued     750 mg 150 mL/hr over 60 Minutes Intravenous Every 12 hours 09/20/16 0135 09/21/16 2210   09/19/16 0800  meropenem (MERREM) 1 g in sodium chloride 0.9 % 100 mL IVPB  Status:  Discontinued     1 g 200 mL/hr over 30 Minutes Intravenous Every 8 hours 09/19/16 0738 09/25/16 1409   09/11/16 1300  metroNIDAZOLE (FLAGYL) IVPB 500 mg  Status:  Discontinued     500 mg 100 mL/hr over 60 Minutes Intravenous Every 8 hours 09/11/16 1208 09/16/16 0912   09/11/16 1300  meropenem (MERREM) 1 g in sodium chloride 0.9 % 100 mL IVPB  Status:  Discontinued     1 g 200 mL/hr over 30 Minutes Intravenous Every 8 hours 09/11/16 1259 09/16/16 0912      Assessment/Plan: Patient Active Problem List   Diagnosis Date Noted  . Hypernatremia 09/20/2016  . Thrombocytosis ([place]) 09/20/2016  . ARDS (adult respiratory distress syndrome) ([place]) 09/18/2016  . Diarrhea 09/18/2016  . Severe protein-calorie malnutrition ([place]) 09/18/2016  . Leukocytosis 09/18/2016  . Peritonitis ([place]) 09/18/2016  . Encounter for central line placement   . Septic shock ([place])   s/p ex lap  [place] for perforated SB   Stable for placement  LTAC a reasonable option as she recovers  Can go when medically ready    LOS: 18 days    Tyre Beaver A. 09/28/2016

## 2016-09-28 NOTE — Plan of Care (Signed)
Problem: Activity: Goal: Risk for activity intolerance will decrease Outcome: Progressing Walking 3 times a day, having some balance issues, needs someone to walk with her  Problem: Nutrition: Goal: Adequate nutrition will be maintained Outcome: Not Progressing Very little apetite.   Problem: Bowel/Gastric: Goal: Will not experience complications related to bowel motility Outcome: Progressing Having some stools, but still bloated and uncomfortable  Problem: Nutritional: Goal: Intake of prescribed amount of daily calories will improve Outcome: Not Progressing Eating very little, but slowly improving  Comments: Walking 3 times a day, still bloated and having pain issues. Parents will be at home to assist patient. Wound vac draining very little. Will get CT scan tomorrow to evaluate any further infection, and then treatment plan will include either IV or PO antibiotics. May need PICC Line placement for home IV antibiotics.

## 2016-09-28 NOTE — Progress Notes (Signed)
I spoke with Dr. Brantley Stage about patient's care. He states wound vac can be discontinued prior to patients discharge due to only 10cc output in last 24hours. MD also states he is okay with patient discontinuing IV antibiotic for discharge. Information relayed to nurse case manager, Aldona Bar.

## 2016-09-28 NOTE — Care Management Note (Addendum)
Case Management Note Marvetta Gibbons RN, BSN Unit 2W-Case Manager--2H coverage [phone]  Patient Details  Name: Brandi Leonard MRN: [id number] Date of Birth: 1965-10-27  Subjective/Objective:  Pt admitted to [facility] with Small bowel obstruction, underwent emergent laparotomy on 6/12 for perforation with peritonitis.  6/15- tx to [facility] with ARDS and septic shock- on vent-  TPN, [place],                  Action/Plan: PTA pt lived at home- CM to follow for d/c needs  Expected Discharge Date:                  Expected Discharge Plan:     In-House Referral:     Discharge planning Services  CM Consult  Post Acute Care Choice:    Choice offered to:     DME Arranged:    DME Agency:     HH Arranged:    HH Agency:     Status of Service:  In process, will continue to follow  If discussed at Long Length of Stay Meetings, dates discussed:    Discharge Disposition:   Additional Comments: 09/28/2016  Per bedside nurse - wound vac will be removed prior to discharge and will not likely discharge with IV antibiotics per surgery  Bedside nurse informed CM that wound vac has virtually no drainage over the past 24 hours - nurse to also contact surgery to inquire about discharge needs including wound vac and IV needs.  Surgery in agreement for [facility] - attending now in agreement for [facility] - referral ordered. However, pt is not in agreement with discharging to facility, pt states "I'm doing too well to go to a facility, I have enough family and friends to help me with whatever I need".  CM ensured that pt was aware that she may likely need wound vac and IV antibiotics at discharge - pt continued to decline facility at discharge, pt states "I want to go home".  CM placed [facility] wound vac form on shadow chart yesterday and requested it be completed with urgency  to allow for approval process with [employer] - form has not been completed as of yet.  CM text paged Dr Brantley Stage.    CM spoke with  attending - CT ordered of abdomen may reveal that additional surgery is needed and therefore LTACH referral will be revisited post CT.  CM informed both LTACHs  Discussed in LOS 09/28/16 - pt remains appropriate for continued stay - IV antibiotics and wound vac requiring dressing changes 3x a week.  Physician Advisor has deemed pt appropriate for [facility] referral, both agencies have accepted referral.  LTACH consideration/order requested by attending.  09/27/16 Pt remains on RA.  Continues to have wound vac - per surgery PA it has not yet been determined if wound vac will be required in the home (surgery to assess wound today at wound change).  [facility] chosen if dme needed at discharge and application on shadow chart (Surgery NP aware that signature will be required if dme is needed at home) [facility] liaison actively following.  CM also contacted [employer] CM and informed that pt will possibly discharge home with wound vac -  [facility] is accepted by insurance and any local [place] agency that accepts insurance is acceptable.  09/22/16 Pt is now extubated on tube feeds, wound vac, NG tube to suction and IV antibiotics.  Pt may start a diet tomorrow.  PT ordered  09/16/16 Pt transferred to ICU after  failingvextubation postoperatively and then developed diffuse bilateral infiltrates consistent with ARDS and septic shock requiring Levophed Maryclare Labrador, RN 09/28/2016, 8:55 AM

## 2016-09-28 NOTE — Progress Notes (Signed)
Patient was able to ambulate through the halls today and did two laps around [facility] unit. Patient became tachycardic in the 115-120s. Patient states she felt better today while ambulating than yesterday. Patient is very strong but needs to be encouraged to walk.

## 2016-09-29 ENCOUNTER — Inpatient Hospital Stay (HOSPITAL_COMMUNITY): Payer: BLUE CROSS/BLUE SHIELD

## 2016-09-29 LAB — GLUCOSE, CAPILLARY
GLUCOSE-CAPILLARY: 125 mg/dL — AB (ref 65–99)
GLUCOSE-CAPILLARY: 272 mg/dL — AB (ref 65–99)
Glucose-Capillary: 85 mg/dL (ref 65–99)

## 2016-09-29 LAB — MAGNESIUM: Magnesium: 1.6 mg/dL — ABNORMAL LOW (ref 1.7–2.4)

## 2016-09-29 MED ORDER — MAGNESIUM HYDROXIDE 400 MG/5ML PO SUSP
30.0000 mL | Freq: Once | ORAL | Status: AC
  Administered 2016-09-29: 30 mL via ORAL
  Filled 2016-09-29: qty 30

## 2016-09-29 MED ORDER — IOPAMIDOL (ISOVUE-300) INJECTION 61%
INTRAVENOUS | Status: AC
  Administered 2016-09-29: 100 mL
  Filled 2016-09-29: qty 100

## 2016-09-29 MED ORDER — PROPRANOLOL HCL ER 60 MG PO CP24
60.0000 mg | ORAL_CAPSULE | Freq: Every day | ORAL | Status: DC
  Administered 2016-09-30 – 2016-10-01 (×2): 60 mg via ORAL
  Filled 2016-09-29 (×2): qty 1

## 2016-09-29 MED ORDER — ASPIRIN EC 81 MG PO TBEC
81.0000 mg | DELAYED_RELEASE_TABLET | Freq: Every day | ORAL | Status: DC
  Administered 2016-09-29 – 2016-10-01 (×3): 81 mg via ORAL
  Filled 2016-09-29 (×3): qty 1

## 2016-09-29 MED ORDER — INSULIN ASPART 100 UNIT/ML ~~LOC~~ SOLN
0.0000 [IU] | Freq: Three times a day (TID) | SUBCUTANEOUS | Status: DC
  Administered 2016-09-29: 5 [IU] via SUBCUTANEOUS
  Administered 2016-09-30: 1 [IU] via SUBCUTANEOUS
  Administered 2016-09-30: 2 [IU] via SUBCUTANEOUS

## 2016-09-29 MED ORDER — IOPAMIDOL (ISOVUE-300) INJECTION 61%
INTRAVENOUS | Status: AC
Start: 2016-09-29 — End: 2016-09-29
  Administered 2016-09-29: 30 mL
  Filled 2016-09-29: qty 30

## 2016-09-29 MED ORDER — OXYCODONE HCL 5 MG PO TABS
5.0000 mg | ORAL_TABLET | ORAL | Status: DC | PRN
  Administered 2016-09-29 – 2016-10-01 (×8): 5 mg via ORAL
  Filled 2016-09-29 (×9): qty 1

## 2016-09-29 MED ORDER — INSULIN ASPART 100 UNIT/ML ~~LOC~~ SOLN
0.0000 [IU] | Freq: Every day | SUBCUTANEOUS | Status: DC
  Administered 2016-09-30: 3 [IU] via SUBCUTANEOUS

## 2016-09-29 NOTE — Progress Notes (Signed)
Has had trouble with fullness and abdominal pain, bloating. States morphine helps. Offered  Oxycodone, and walking, patient states that does not work as well. Discussed  Bridging to home and morphine not available, oxycodone lasts longer etc. Drinking contrast for CT slowly.

## 2016-09-29 NOTE — Progress Notes (Signed)
Subjective/Chief Complaint: Doing well No c/o this AM   Objective: Vital signs in last 24 hours: Temp:  [98.2 F (36.8 C)-99 F (37.2 C)] 98.5 F (36.9 C) (07/04 0751) Pulse Rate:  [96-104] 97 (07/04 0751) Resp:  [13-21] 16 (07/04 0751) BP: (107-127)/(74-93) 116/74 (07/04 0751) SpO2:  [93 %-100 %] 96 % (07/04 0751) Weight:  [51.5 kg (113 lb 8.6 oz)] 51.5 kg (113 lb 8.6 oz) (07/04 0340) Last BM Date: 09/27/16  Intake/Output from previous day: 07/03 0701 - 07/04 0700 In: 600 [P.O.:600] Out: 1610 [Urine:1600; Drains:10] Intake/Output this shift: Total I/O In: 240 [P.O.:240] Out: 750 [Urine:750]  General appearance: alert and cooperative GI: soft, non-tender; bowel sounds normal; no masses,  no organomegaly  Lab Results:   Recent Labs  09/27/16 0346  WBC 11.9*  HGB 10.4*  HCT 33.5*  PLT 573*   BMET  Recent Labs  09/27/16 0346 09/28/16 0622  NA 139 137  K 4.3 3.7  CL 105 101  CO2 25 23  GLUCOSE 107* 98  BUN <5* 6  CREATININE 0.41* 0.45  CALCIUM 8.9 8.8*   PT/INR No results for input(s): LABPROT, INR in the last 72 hours. ABG No results for input(s): PHART, HCO3 in the last 72 hours.  Invalid input(s): PCO2, PO2  Studies/Results: Ct Abdomen Pelvis W Contrast  Result Date: 09/29/2016 CLINICAL DATA:  Abscess EXAM: CT ABDOMEN AND PELVIS WITH CONTRAST TECHNIQUE: Multidetector CT imaging of the abdomen and pelvis was performed using the standard protocol following bolus administration of intravenous contrast. CONTRAST:  145mL ISOVUE-300 IOPAMIDOL (ISOVUE-300) INJECTION 61% COMPARISON:  09/20/2016 FINDINGS: Lower chest: Small left pleural effusion. Dependent bibasilar atelectasis. Heart is normal size. Hepatobiliary: Prior cholecystectomy.  No focal hepatic abnormality. Pancreas: No focal abnormality or ductal dilatation. Spleen: No focal abnormality.  Normal size. Adrenals/Urinary Tract: No adrenal abnormality. No focal renal abnormality. No stones or  hydronephrosis. Urinary bladder is unremarkable. Stomach/Bowel: Colon is mildly distended with fluid and stool. Small bowel and stomach unremarkable. No change. Vascular/Lymphatic: Aortic and iliac calcifications. No aneurysm or adenopathy. Reproductive: Heterogeneous enhancement of the uterus, possibly related to large fibroid. 7.1 cm left adnexal cystic mass, not significantly changed since prior study. Complex septated cystic structure in the right adnexa measures 4.7 x 3.5 cm, not significantly changed since prior study. Other: No free fluid or free air. Previously seen left lateral fluid collection has decreased in size he measures 2.7 x 0.8 cm compared to 4.2 x 2.2 cm previously. Right paracolic gutter fluid collection decreased, measuring 2.3 x 0.9 cm compared to 5.1 x 1.8 cm previously. Musculoskeletal: No acute bony abnormality. IMPRESSION: Fluid collections in the left lateral abdomen and right paracolic gutter have decreased in size since prior study. Bilateral cystic adnexal masses are unchanged since prior study. Small left pleural effusion.  Bibasilar atelectasis. Fluid filled mildly distended colon again noted, unchanged. Electronically Signed   By: Rolm Baptise M.D.   On: 09/29/2016 07:41    Anti-infectives: Anti-infectives    Start     Dose/Rate Route Frequency Ordered Stop   09/25/16 1600  piperacillin-tazobactam (ZOSYN) IVPB 3.375 g     3.375 g 12.5 mL/hr over 240 Minutes Intravenous Every 8 hours 09/25/16 1409     09/21/16 2230  vancomycin (VANCOCIN) IVPB 1000 mg/200 mL premix  Status:  Discontinued     1,000 mg 200 mL/hr over 60 Minutes Intravenous Every 8 hours 09/21/16 2210 09/22/16 1102   09/21/16 1100  anidulafungin (ERAXIS) 100 mg in sodium chloride  0.9 % 100 mL IVPB     100 mg 78 mL/hr over 100 Minutes Intravenous Every 24 hours 09/20/16 1048     09/20/16 1100  anidulafungin (ERAXIS) 200 mg in sodium chloride 0.9 % 200 mL IVPB     200 mg 78 mL/hr over 200 Minutes  Intravenous  Once 09/20/16 1048 09/20/16 1510   09/20/16 0145  vancomycin (VANCOCIN) IVPB 750 mg/150 ml premix  Status:  Discontinued     750 mg 150 mL/hr over 60 Minutes Intravenous Every 12 hours 09/20/16 0135 09/21/16 2210   09/19/16 0800  meropenem (MERREM) 1 g in sodium chloride 0.9 % 100 mL IVPB  Status:  Discontinued     1 g 200 mL/hr over 30 Minutes Intravenous Every 8 hours 09/19/16 0738 09/25/16 1409   09/11/16 1300  metroNIDAZOLE (FLAGYL) IVPB 500 mg  Status:  Discontinued     500 mg 100 mL/hr over 60 Minutes Intravenous Every 8 hours 09/11/16 1208 09/16/16 0912   09/11/16 1300  meropenem (MERREM) 1 g in sodium chloride 0.9 % 100 mL IVPB  Status:  Discontinued     1 g 200 mL/hr over 30 Minutes Intravenous Every 8 hours 09/11/16 1259 09/16/16 0912      Assessment/Plan: Principal Problem:   Septic shock ([place]) Active Problems:   Encounter for central line placement   ARDS (adult respiratory distress syndrome) (HCC)   Diarrhea   Severe protein-calorie malnutrition (HCC)   Leukocytosis   Peritonitis (HCC)   Hypernatremia   Thrombocytosis ([place])  S/p ExLap for perforated SB at [facility] for placement when medically stable   LOS: 19 days    Rosario Jacks., Anne Hahn 09/29/2016

## 2016-09-29 NOTE — Progress Notes (Signed)
Medicated with morphine prior to CT> nauseated post contrast po took a bottle and a half.

## 2016-09-29 NOTE — Progress Notes (Addendum)
[facility] TEAM 1 - Stepdown/ICU TEAM  Brandi Leonard  ZSW:[id number] DOB: 1965/05/07 DOA: 09/10/2016 PCP: No primary care provider on file.    Brief Narrative:  51yo F w/ no known med hx who was originally admitted to [facility] 6/11 with a small bowel obstruction.  She underwent emergent laparotomy on 6/12 for perforation with peritonitis, but failed extubation postoperatively and then developed diffuse bilateral infiltrates consistent with ARDS and septic shock requiring Levophed.  She was able to be extubated 6/21, but on 6/25 developed worsening respiratory failure, ?aspiration v HCAP.    Subjective: Pt has been walking round the unit w/ RN help.  She reports a poor appetite, but did have a bowel movement this morning.  She denies cp, n/v, or abdom pain.  Assessment & Plan:  Acute hypoxic respiratory failure - aspiration pneumonia - ARDS Resolved  Small bowel obstruction with perforation - septic shock Gen Surgery has cleared her for d/c - tolerating diet and moving bowels   Abdominal fluid collections s/p CT guided drainage of small bilateral abd abscesses 6/26 - cultures have revealed pan-sensitive E coli - plan to transition to oral abx at time of d/c and continue until resolution of collections - CT today notes decrease in size, but both collections still present - pt w/o systemic signs of infection at this time - f/u WBC in AM   Hyperglycemia No reported hx of DM - check A1c   Cardiogenic shock - demand ischemia Resolved - due to severe sepsis in setting of peritonitis  Acute diastolic congestive heart failure Clinically stable w/ no evidence of volume overload on exam - avoid lasix in setting of poor oral intake  Filed Weights   09/27/16 0316 09/28/16 0344 09/29/16 0340  Weight: 50.6 kg (111 lb 8.8 oz) 50.9 kg (112 lb 3.4 oz) 51.5 kg (113 lb 8.6 oz)    Shock liver Resolved   Severe protein calorie malnutrition Intake improving - Nutrition following  Anemia of  critical illness F/u Hgb in AM   Relative adrenal insufficiency Cortisol 12 in midst of acute critical illness - not presently requiring steroid tx   Seizure - benzodiazepine withdrawal Stable on low dose klonopin   DVT prophylaxis: lovenox  Code Status: FULL CODE Family Communication: no family present at time of exam  Disposition Plan: plan at present is for d/c home - anticipate d/c in 24-48hrs if oral intake persists and pt becomes stronger   Consultants:  General surgery Critical care   Objective: Blood pressure 113/78, pulse 97, temperature 97.6 F (36.4 C), temperature source Oral, resp. rate 15, height 5\' 5"  (1.651 m), weight 51.5 kg (113 lb 8.6 oz), last menstrual period 09/16/2016, SpO2 94 %.  Intake/Output Summary (Last 24 hours) at 09/29/16 1635 Last data filed at 09/29/16 1600  Gross per 24 hour  Intake             1200 ml  Output             3150 ml  Net            -1950 ml   Filed Weights   09/27/16 0316 09/28/16 0344 09/29/16 0340  Weight: 50.6 kg (111 lb 8.8 oz) 50.9 kg (112 lb 3.4 oz) 51.5 kg (113 lb 8.6 oz)    Examination: General: No acute respiratory distress - alert and conversant  Lungs: CTA B w/o wheezing  Cardiovascular: RRR w/o M or rub  Abdomen: Nondistended, soft, bowel sounds positive, no rebound Extremities: No  edema bilateral lower extremities  CBC:  Recent Labs Lab 09/23/16 0430 09/24/16 0553 09/25/16 1228 09/26/16 0348 09/27/16 0346  WBC 10.4 10.6* 10.1 9.4 11.9*  NEUTROABS 8.7* 9.0* 7.9*  --   --   HGB 8.0* 9.1* 10.8* 9.8* 10.4*  HCT 27.0* 30.1* 35.2* 31.9* 33.5*  MCV 82.3 82.7 81.1 81.8 82.5  PLT 624* 624* 633* 568* 606*   Basic Metabolic Panel:  Recent Labs Lab 09/23/16 0430 09/24/16 0553 09/25/16 1228 09/26/16 0348 09/27/16 0346 09/28/16 0622 09/29/16 0808  NA 141 137 138 136 139 137  --   K 3.1* 3.5 3.0* 3.4* 4.3 3.7  --   CL 104 101 100* 102 105 101  --   CO2 30 28 27 25 25 23   --   GLUCOSE 108* 114* 131*  162* 107* 98  --   BUN 11 6 <5* <5* <5* 6  --   CREATININE 0.36* 0.38* 0.42* 0.52 0.41* 0.45  --   CALCIUM 7.9* 8.1* 8.4* 8.3* 8.9 8.8*  --   MG 1.8 1.9 1.8  --   --   --  1.6*  PHOS  --  3.5  --   --   --   --   --    GFR: Estimated Creatinine Clearance: 69.9 mL/min (by C-G formula based on SCr of 0.45 mg/dL).  Liver Function Tests:  Recent Labs Lab 09/23/16 0430 09/25/16 1228  AST 17 21  ALT 44 32  ALKPHOS 127* 204*  BILITOT 0.3 0.3  PROT 4.5* 6.0*  ALBUMIN 1.5* 2.0*   CBG:  Recent Labs Lab 09/28/16 1244 09/28/16 1724 09/28/16 2150 09/29/16 0747 09/29/16 1257  GLUCAP 161* 195* 126* 125* 272*    Recent Results (from the past 240 hour(s))  Fungus culture, blood     Status: None   Collection Time: 09/20/16  3:00 AM  Result Value Ref Range Status   Specimen Description BLOOD BLOOD LEFT FOREARM  Final   Special Requests   Final    BOTTLES DRAWN AEROBIC ONLY Blood Culture adequate volume   Culture NO GROWTH 5 DAYS  Final   Report Status 09/26/2016 FINAL  Final  Aerobic/Anaerobic Culture (surgical/deep wound)     Status: None   Collection Time: 09/21/16  4:15 PM  Result Value Ref Range Status   Specimen Description ABDOMEN LEFT  Final   Special Requests Normal  Final   Gram Stain   Final    ABUNDANT WBC PRESENT,BOTH PMN AND MONONUCLEAR RARE GRAM NEGATIVE RODS    Culture FEW ESCHERICHIA COLI NO ANAEROBES ISOLATED   Final   Report Status 09/26/2016 FINAL  Final   Organism ID, Bacteria ESCHERICHIA COLI  Final      Susceptibility   Escherichia coli - MIC*    AMPICILLIN 8 SENSITIVE Sensitive     CEFAZOLIN <=4 SENSITIVE Sensitive     CEFEPIME <=1 SENSITIVE Sensitive     CEFTAZIDIME <=1 SENSITIVE Sensitive     CEFTRIAXONE <=1 SENSITIVE Sensitive     CIPROFLOXACIN >=4 RESISTANT Resistant     GENTAMICIN <=1 SENSITIVE Sensitive     IMIPENEM <=0.25 SENSITIVE Sensitive     TRIMETH/SULFA <=20 SENSITIVE Sensitive     AMPICILLIN/SULBACTAM 4 SENSITIVE Sensitive      PIP/TAZO <=4 SENSITIVE Sensitive     Extended ESBL NEGATIVE Sensitive     * FEW ESCHERICHIA COLI  Aerobic/Anaerobic Culture (surgical/deep wound)     Status: None   Collection Time: 09/21/16  4:16 PM  Result Value Ref Range  Status   Specimen Description ABDOMEN RIGHT  Final   Special Requests Normal  Final   Gram Stain   Final    ABUNDANT WBC PRESENT,BOTH PMN AND MONONUCLEAR NO ORGANISMS SEEN    Culture No growth aerobically or anaerobically.  Final   Report Status 09/26/2016 FINAL  Final     Scheduled Meds: . aspirin  81 mg Oral Daily  . clonazePAM  0.5 mg Oral BID  . docusate sodium  100 mg Oral BID  . enoxaparin (LOVENOX) injection  40 mg Subcutaneous Q24H  . feeding supplement  1 Container Oral TID BM  . insulin aspart  0-15 Units Subcutaneous TID WC  . multivitamin with minerals  1 tablet Oral Daily  . propranolol  15 mg Oral TID  . sodium chloride flush  10-40 mL Intracatheter Q12H     LOS: 19 days   Cherene Altes, MD [facility] Office  [phone] Pager - Text Page per Amion as per below:  On-Call/Text Page:      [email or web address]      password [email or web address]  If 7PM-7AM, please contact night-coverage [email or web address] Password [facility] 09/29/2016, 4:35 PM

## 2016-09-29 NOTE — Progress Notes (Signed)
Pt transitioning to oral pain medication. Low stamina: encouraged to use spirometer and OOB, walking 3 times daily. Poor appetite with many recommendations of protein choices. Flat affect . Parents @ bedside throughout day and aware of goals of care.

## 2016-09-30 DIAGNOSIS — F17210 Nicotine dependence, cigarettes, uncomplicated: Secondary | ICD-10-CM

## 2016-09-30 DIAGNOSIS — K651 Peritoneal abscess: Secondary | ICD-10-CM | POA: Diagnosis present

## 2016-09-30 DIAGNOSIS — B9620 Unspecified Escherichia coli [E. coli] as the cause of diseases classified elsewhere: Secondary | ICD-10-CM

## 2016-09-30 DIAGNOSIS — R140 Abdominal distension (gaseous): Secondary | ICD-10-CM

## 2016-09-30 DIAGNOSIS — Z978 Presence of other specified devices: Secondary | ICD-10-CM

## 2016-09-30 DIAGNOSIS — K56609 Unspecified intestinal obstruction, unspecified as to partial versus complete obstruction: Secondary | ICD-10-CM | POA: Diagnosis present

## 2016-09-30 DIAGNOSIS — Z9889 Other specified postprocedural states: Secondary | ICD-10-CM

## 2016-09-30 DIAGNOSIS — Z8719 Personal history of other diseases of the digestive system: Secondary | ICD-10-CM

## 2016-09-30 DIAGNOSIS — B9689 Other specified bacterial agents as the cause of diseases classified elsewhere: Secondary | ICD-10-CM

## 2016-09-30 LAB — COMPREHENSIVE METABOLIC PANEL
ALK PHOS: 123 U/L (ref 38–126)
ALT: 21 U/L (ref 14–54)
AST: 19 U/L (ref 15–41)
Albumin: 2.2 g/dL — ABNORMAL LOW (ref 3.5–5.0)
Anion gap: 12 (ref 5–15)
BILIRUBIN TOTAL: UNDETERMINED mg/dL (ref 0.3–1.2)
CALCIUM: 9.1 mg/dL (ref 8.9–10.3)
CO2: 24 mmol/L (ref 22–32)
Chloride: 101 mmol/L (ref 101–111)
Creatinine, Ser: 0.48 mg/dL (ref 0.44–1.00)
GFR calc Af Amer: >60 mL/min (ref >60)
Glucose, Bld: 102 mg/dL — ABNORMAL HIGH (ref 65–99)
POTASSIUM: 3.9 mmol/L (ref 3.5–5.1)
Sodium: 137 mmol/L (ref 135–145)
Total Protein: 6.1 g/dL — ABNORMAL LOW (ref 6.5–8.1)

## 2016-09-30 LAB — GLUCOSE, CAPILLARY
GLUCOSE-CAPILLARY: 105 mg/dL — AB (ref 65–99)
GLUCOSE-CAPILLARY: 153 mg/dL — AB (ref 65–99)
Glucose-Capillary: 135 mg/dL — ABNORMAL HIGH (ref 65–99)
Glucose-Capillary: 148 mg/dL — ABNORMAL HIGH (ref 65–99)
Glucose-Capillary: 264 mg/dL — ABNORMAL HIGH (ref 65–99)

## 2016-09-30 LAB — CBC
HCT: 32.7 % — ABNORMAL LOW (ref 36.0–46.0)
Hemoglobin: 9.6 g/dL — ABNORMAL LOW (ref 12.0–15.0)
MCH: 24.6 pg — ABNORMAL LOW (ref 26.0–34.0)
MCHC: 29.4 g/dL — AB (ref 30.0–36.0)
MCV: 83.8 fL (ref 78.0–100.0)
PLATELETS: 597 10*3/uL — AB (ref 150–400)
RBC: 3.9 MIL/uL (ref 3.87–5.11)
RDW: 17.9 % — AB (ref 11.5–15.5)
WBC: 8.6 10*3/uL (ref 4.0–10.5)

## 2016-09-30 LAB — MAGNESIUM: MAGNESIUM: 1.8 mg/dL (ref 1.7–2.4)

## 2016-09-30 MED ORDER — CLONAZEPAM 0.5 MG PO TABS
0.5000 mg | ORAL_TABLET | Freq: Two times a day (BID) | ORAL | Status: DC
  Administered 2016-09-30 – 2016-10-01 (×2): 0.5 mg via ORAL
  Filled 2016-09-30 (×2): qty 1

## 2016-09-30 MED ORDER — AMOXICILLIN-POT CLAVULANATE 875-125 MG PO TABS
1.0000 | ORAL_TABLET | Freq: Two times a day (BID) | ORAL | Status: DC
  Administered 2016-09-30 – 2016-10-01 (×3): 1 via ORAL
  Filled 2016-09-30 (×4): qty 1

## 2016-09-30 NOTE — Care Management Note (Addendum)
Case Management Note Marvetta Gibbons RN, BSN Unit 2W-Case Manager--2H coverage [phone]  Patient Details  Name: Brandi Leonard MRN: [id number] Date of Birth: 02-Sep-1965  Subjective/Objective:  Pt admitted to [facility] with Small bowel obstruction, underwent emergent laparotomy on 6/12 for perforation with peritonitis.  6/15- tx to [facility] with ARDS and septic shock- on vent-  TPN, [place],                  Action/Plan: PTA pt lived at home- CM to follow for d/c needs  Expected Discharge Date:                  Expected Discharge Plan:     In-House Referral:     Discharge planning Services  CM Consult  Post Acute Care Choice:    Choice offered to:     DME Arranged:    DME Agency:     HH Arranged:    HH Agency:     Status of Service:  In process, will continue to follow  If discussed at Long Length of Stay Meetings, dates discussed:    Discharge Disposition:   Additional Comments: 09/30/2016  [employer] will accept any local agency that accepts insurance for [facility] or DME.  Pt will no longer discharge home IV antibiotics nor wound vac.  If pt is deemed to need HH or DME at discharge - choice will need to be given to pt  Per bedside nurse - wound vac will be removed prior to discharge and will not likely discharge with IV antibiotics per surgery  Bedside nurse informed CM that wound vac has virtually no drainage over the past 24 hours - nurse to also contact surgery to inquire about discharge needs including wound vac and IV needs.  Surgery in agreement for [facility] - attending now in agreement for [facility] - referral ordered. However, pt is not in agreement with discharging to facility, pt states "I'm doing too well to go to a facility, I have enough family and friends to help me with whatever I need".  CM ensured that pt was aware that she may likely need wound vac and IV antibiotics at discharge - pt continued to decline facility at discharge, pt states "I want to go home".  CM  placed [facility] wound vac form on shadow chart yesterday and requested it be completed with urgency  to allow for approval process with [employer] - form has not been completed as of yet.  CM text paged Dr Brantley Stage.    CM spoke with attending - CT ordered of abdomen may reveal that additional surgery is needed and therefore LTACH referral will be revisited post CT.  CM informed both LTACHs  Discussed in LOS 09/28/16 - pt remains appropriate for continued stay - IV antibiotics and wound vac requiring dressing changes 3x a week.  Physician Advisor has deemed pt appropriate for [facility] referral, both agencies have accepted referral.  LTACH consideration/order requested by attending.  09/27/16 Pt remains on RA.  Continues to have wound vac - per surgery PA it has not yet been determined if wound vac will be required in the home (surgery to assess wound today at wound change).  [facility] chosen if dme needed at discharge and application on shadow chart (Surgery NP aware that signature will be required if dme is needed at home) [facility] liaison actively following.  CM also contacted [employer] CM and informed that pt will possibly discharge home with wound vac -  [facility] is accepted  by insurance and any local [place] agency that accepts insurance is acceptable.  09/22/16 Pt is now extubated on tube feeds, wound vac, NG tube to suction and IV antibiotics.  Pt may start a diet tomorrow.  PT ordered  09/16/16 Pt transferred to ICU after failingvextubation postoperatively and then developed diffuse bilateral infiltrates consistent with ARDS and septic shock requiring Levophed Maryclare Labrador, RN 09/30/2016, 3:23 PM

## 2016-09-30 NOTE — Progress Notes (Signed)
PROGRESS NOTE    Brandi Leonard  ZDG:[id number] DOB: 1965-11-01 DOA: 09/10/2016 PCP: No primary care provider on file.   Brief Narrative:  51 year old WF PMHx none on file   Admitted to [facility] 6/11 with Small bowel obstruction, underwent emergent laparotomy on 6/12 for perforation with peritonitis. Failed extubation postoperatively and then developed diffuse bilateral infiltrates consistent with ARDS and septic shock requiring Levophed.  Extubated 6/21.  On 6/25 developed worsening respiratory failure, ?aspiration v HCAP, large volume stool output.  PCCM reconsulted   Subjective: 7/5  A/O 4, negative SOB, negative CP, positive mild abdominal pain, negative N/V, negative postprandial abdominal pain or nausea. Sitting in Chair. Is ambulating around ward.    Assessment & Plan:   Principal Problem:   Septic shock ([place]) Active Problems:   Encounter for central line placement   ARDS (adult respiratory distress syndrome) (HCC)   Diarrhea   Severe protein-calorie malnutrition (HCC)   Leukocytosis   Peritonitis (HCC)   Hypernatremia   Thrombocytosis (HCC)    Respiratory failure with hypoxia /Aspiration PNA/ ARDS (extubated 6/21) -Resolved -Titrate O2 to maintain SPO2> 93% -Patient currently on antibiotics for SBO with perforation would also cover aspiration pneumonia. Converted to PO per ID  COPD without acute exacerbation -Atrovent and Xopenex PRN   Cardiogenic Shock/Demand ischemia   -likely septic due to peritonitis - resolved.  -Patient currently asymptomatic  Acute diastolic CHF -Strict I&O since admission - 5.7 L -Daily weight Filed Weights   09/27/16 0316 09/28/16 0344 09/29/16 0340  Weight: 111 lb 8.8 oz (50.6 kg) 112 lb 3.4 oz (50.9 kg) 113 lb 8.6 oz (51.5 kg)  -Propranolol 15 mg TID -Transfuse for hemoglobin<8  SBO with perforation/Septic VZDGL@@@@  -complicated by perforation now s/p repair 6/12; c/b post op abd pain, colitis and what appears to be  intra-abd abscess by CT scan 6/25 -Cont wound vac per surgeons (MWF) -S/p CT guided drainage of small bilateral abd abscesses 6/26 -CT abdomen and pelvis 7/4; abscesses decreasing see results below -Amoxicillin Clavulanate 875 mg twice a day for 2 more weeks per ID --Per Surgery note 7/5 Wound Vac to be D/Ced. Pt can be discarged w/o vac.   Shock liver  -Trend LFTs  Severe protein cal malnutrition -Tolerating regular diet  Large volume stool output  - C.Diff neg   Anemia of critical illness.  -No evidence of acute bleeding  Recent Labs Lab 09/24/16 0553 09/25/16 1228 09/26/16 0348 09/27/16 0346 09/30/16 0231  HGB 9.1* 10.8* 9.8* 10.4* 9.6*  -Stable   Thrombocytosis  -most likely reactive, stable   Relative Adrenal insufficiency (cortisol 12 at )  -Resolved -Continue moderate SSI  Acute metabolic encephalopathy -resolved  Seizure - ?benzo withdrawal  -Cont Clonazepam as ODT (on reduced dosing from home and doing well) -Morphine PRN   -Watch for seizure  Hypokalemia -Potassium goal>4  Hypomagnesemia -Magnessium goal>2    DVT prophylaxis: Lovenox Code Status: Full Family Communication: None Disposition Plan: Per surgery   Consultants:  PC CM CCS   Procedures/Significant Events:  6/11 admit for SBO 6/11 CT abdomen  > multiple dilated loops of fluid filled small bowel consistent with mechanical small bowel obstruction 6/12 perf to OR for repair 6/13 contnue shock, resp failure 6/15 transfer to [facility] for ICU admit. 6/16 Echo  >>LVEF= 50% to 55%.  -(grade 1 diastolic dysfunction).  - Pericardium, extracardiac: There is a large left pleural effusion.  6/24 CTA chest >>> Neg PE, L>R bilat effusions, LLL consolidation with air bronchograms, RML  volume loss  BLE venous doppler 6/25>>> neg   6/25 CT Abd : Colon is diffusely dilated with fluid despite having a rectal tube. No significant small bowel dilatation.There are two fluid collections in the  pelvis which were likely present prior to the recent surgery. The largest is in the left lower quadrant measuring over 7 cm and there is a smaller complex collection in the right anterior pelvic region measuring 4.4 cm. These could be adnexal cystic structures but indeterminate. Ideally these areas could be further characterized with a pelvic ultrasound but may be difficult with the fluid-filled colon. There are two new postoperative fluid collections along the lateral abdomen. These could represent pockets of loculated ascites or abscess collections. Trace left pleural fluid with consolidation or volume loss in the left lower lobe. Volume loss in the right lower lobe. 6/25- worsening resp status 6/26 IR drainage pus on left but NO drain required 7/4 CT abdomen pelvis W contrast; -Fluid collections in the left lateral abdomen and right paracolic gutter have decreased in size since prior study. -Bilateral cystic adnexal masses are unchanged since prior study. -Small left pleural effusion.  Bibasilar atelectasis. -Fluid filled mildly distended colon again noted, unchanged.       VENTILATOR SETTINGS: None   Cultures Blood 6/12 > Coag neg staph 1/2 bottles BC x 2 6/15>>> NEG Sputum 6/15>> few candida CDiff 6/22>>> neg  BC x 2 6/24>>>  negative final Surgical wound 6/26: Positive Escherichia coli    Antimicrobials:Anti-infectives    Start     Stop   09/30/16 1515  amoxicillin-clavulanate (AUGMENTIN) 875-125 MG per tablet 1 tablet         09/25/16 1600  piperacillin-tazobactam (ZOSYN) IVPB 3.375 g  Status:  Discontinued     09/30/16 1425   09/21/16 2230  vancomycin (VANCOCIN) IVPB 1000 mg/200 mL premix  Status:  Discontinued     09/22/16 1102   09/21/16 1100  anidulafungin (ERAXIS) 100 mg in sodium chloride 0.9 % 100 mL IVPB  Status:  Discontinued     09/30/16 1425   09/20/16 1100  anidulafungin (ERAXIS) 200 mg in sodium chloride 0.9 % 200 mL IVPB     09/20/16 1510   09/20/16 0145   vancomycin (VANCOCIN) IVPB 750 mg/150 ml premix  Status:  Discontinued     09/21/16 2210   09/19/16 0800  meropenem (MERREM) 1 g in sodium chloride 0.9 % 100 mL IVPB  Status:  Discontinued     09/25/16 1409   09/11/16 1300  metroNIDAZOLE (FLAGYL) IVPB 500 mg  Status:  Discontinued     09/16/16 0912   09/11/16 1300  meropenem (MERREM) 1 g in sodium chloride 0.9 % 100 mL IVPB  Status:  Discontinued     09/16/16 0912       Devices    LINES / TUBES:  ETT 6/13 >6/21 R fem CVL 6/12 >6/15 Art line rt radial 6/15 > 6/21 IJ CVL 6/15>>>7/1    Continuous Infusions: . anidulafungin Stopped (09/29/16 1240)  . piperacillin-tazobactam (ZOSYN)  IV 3.375 g (09/30/16 0821)     Objective: Vitals:   09/30/16 0033 09/30/16 0200 09/30/16 0240 09/30/16 0809  BP: 96/73  109/78 115/85  Pulse: 89 87 90 (!) 104  Resp: 15 16 14 15   Temp: 98.6 F (37 C)  98.4 F (36.9 C) 98 F (36.7 C)  TempSrc: Oral  Oral Oral  SpO2: 96% 97% 95% 96%  Weight:      Height:  Intake/Output Summary (Last 24 hours) at 09/30/16 0828 Last data filed at 09/30/16 0600  Gross per 24 hour  Intake              780 ml  Output             2100 ml  Net            -1320 ml   Filed Weights   09/27/16 0316 09/28/16 0344 09/29/16 0340  Weight: 111 lb 8.8 oz (50.6 kg) 112 lb 3.4 oz (50.9 kg) 113 lb 8.6 oz (51.5 kg)    Examination:  General: A/O 4 No acute respiratory distress, cachectic Eyes: negative scleral hemorrhage, negative anisocoria, negative icterus ENT: Negative Runny nose, negative gingival bleeding, Neck:  Negative scars, masses, torticollis, lymphadenopathy, JVD Lungs: Clear to auscultation bilaterally without wheezes or crackles Cardiovascular: Regular rhythm And rate without murmur gallop or rub normal S1 and S2 Abdomen: Positive abdominal pain greatest LUQ/LLQ( Improving), nondistended, negative bowel sounds, no rebound, no ascites, no appreciable mass, wound VAC in place draining scant  serosanguineous fluid Extremities: No significant cyanosis, clubbing, or edema bilateral lower extremities Skin: Negative rashes, lesions, ulcers Psychiatric:  Negative depression, negative anxiety, negative fatigue, negative mania  Central nervous system:  Cranial nerves II through XII intact, tongue/uvula midline, all extremities muscle strength 5/5, sensation intact throughout, negative dysarthria, negative expressive aphasia, negative receptive aphasia.  .     Data Reviewed: Care during the described time interval was provided by me .  I have reviewed this patient's available data, including medical history, events of note, physical examination, and all test results as part of my evaluation. I have personally reviewed and interpreted all radiology studies.  CBC:  Recent Labs Lab 09/24/16 0553 09/25/16 1228 09/26/16 0348 09/27/16 0346 09/30/16 0231  WBC 10.6* 10.1 9.4 11.9* 8.6  NEUTROABS 9.0* 7.9*  --   --   --   HGB 9.1* 10.8* 9.8* 10.4* 9.6*  HCT 30.1* 35.2* 31.9* 33.5* 32.7*  MCV 82.7 81.1 81.8 82.5 83.8  PLT 624* 633* 568* 573* 956*   Basic Metabolic Panel:  Recent Labs Lab 09/24/16 0553 09/25/16 1228 09/26/16 0348 09/27/16 0346 09/28/16 0622 09/29/16 0808 09/30/16 0231 09/30/16 0529  NA 137 138 136 139 137  --  137  --   K 3.5 3.0* 3.4* 4.3 3.7  --  3.9  --   CL 101 100* 102 105 101  --  101  --   CO2 28 27 25 25 23   --  24  --   GLUCOSE 114* 131* 162* 107* 98  --  102*  --   BUN 6 <5* <5* <5* 6  --  <5*  --   CREATININE 0.38* 0.42* 0.52 0.41* 0.45  --  0.48  --   CALCIUM 8.1* 8.4* 8.3* 8.9 8.8*  --  9.1  --   MG 1.9 1.8  --   --   --  1.6*  --  1.8  PHOS 3.5  --   --   --   --   --   --   --    GFR: Estimated Creatinine Clearance: 69.9 mL/min (by C-G formula based on SCr of 0.48 mg/dL). Liver Function Tests:  Recent Labs Lab 09/25/16 1228 09/30/16 0231  AST 21 19  ALT 32 21  ALKPHOS 204* 123  BILITOT 0.3 QUANTITY NOT SUFFICIENT, UNABLE TO  PERFORM TEST  PROT 6.0* 6.1*  ALBUMIN 2.0* 2.2*   No results for input(s):  LIPASE, AMYLASE in the last 168 hours. No results for input(s): AMMONIA in the last 168 hours. Coagulation Profile: No results for input(s): INR, PROTIME in the last 168 hours. Cardiac Enzymes: No results for input(s): CKTOTAL, CKMB, CKMBINDEX, TROPONINI in the last 168 hours. BNP (last 3 results) No results for input(s): PROBNP in the last 8760 hours. HbA1C: No results for input(s): HGBA1C in the last 72 hours. CBG:  Recent Labs Lab 09/28/16 1724 09/28/16 2150 09/29/16 0747 09/29/16 1257 09/29/16 2006  GLUCAP 195* 126* 125* 272* 85   Lipid Profile: No results for input(s): CHOL, HDL, LDLCALC, TRIG, CHOLHDL, LDLDIRECT in the last 72 hours. Thyroid Function Tests:  Recent Labs  09/28/16 0622  TSH 3.544   Anemia Panel: No results for input(s): VITAMINB12, FOLATE, FERRITIN, TIBC, IRON, RETICCTPCT in the last 72 hours. Urine analysis:    Component Value Date/Time   COLORURINE YELLOW 09/10/2016 1522   APPEARANCEUR CLEAR 09/10/2016 1522   APPEARANCEUR Cloudy 03/05/2012 1834   LABSPEC 1.016 09/10/2016 1522   LABSPEC 1.032 03/05/2012 1834   PHURINE 6.0 09/10/2016 1522   GLUCOSEU 150 (A) 09/10/2016 1522   GLUCOSEU Negative 03/05/2012 1834   HGBUR SMALL (A) 09/10/2016 1522   BILIRUBINUR NEGATIVE 09/10/2016 1522   BILIRUBINUR Negative 03/05/2012 1834   KETONESUR 5 (A) 09/10/2016 1522   PROTEINUR NEGATIVE 09/10/2016 1522   NITRITE NEGATIVE 09/10/2016 1522   LEUKOCYTESUR TRACE (A) 09/10/2016 1522   LEUKOCYTESUR Negative 03/05/2012 1834   Sepsis Labs: @LABRCNTIP (procalcitonin:4,lacticidven:4)  ) Recent Results (from the past 240 hour(s))  Aerobic/Anaerobic Culture (surgical/deep wound)     Status: None   Collection Time: 09/21/16  4:15 PM  Result Value Ref Range Status   Specimen Description ABDOMEN LEFT  Final   Special Requests Normal  Final   Gram Stain   Final    ABUNDANT WBC  PRESENT,BOTH PMN AND MONONUCLEAR RARE GRAM NEGATIVE RODS    Culture FEW ESCHERICHIA COLI NO ANAEROBES ISOLATED   Final   Report Status 09/26/2016 FINAL  Final   Organism ID, Bacteria ESCHERICHIA COLI  Final      Susceptibility   Escherichia coli - MIC*    AMPICILLIN 8 SENSITIVE Sensitive     CEFAZOLIN <=4 SENSITIVE Sensitive     CEFEPIME <=1 SENSITIVE Sensitive     CEFTAZIDIME <=1 SENSITIVE Sensitive     CEFTRIAXONE <=1 SENSITIVE Sensitive     CIPROFLOXACIN >=4 RESISTANT Resistant     GENTAMICIN <=1 SENSITIVE Sensitive     IMIPENEM <=0.25 SENSITIVE Sensitive     TRIMETH/SULFA <=20 SENSITIVE Sensitive     AMPICILLIN/SULBACTAM 4 SENSITIVE Sensitive     PIP/TAZO <=4 SENSITIVE Sensitive     Extended ESBL NEGATIVE Sensitive     * FEW ESCHERICHIA COLI  Aerobic/Anaerobic Culture (surgical/deep wound)     Status: None   Collection Time: 09/21/16  4:16 PM  Result Value Ref Range Status   Specimen Description ABDOMEN RIGHT  Final   Special Requests Normal  Final   Gram Stain   Final    ABUNDANT WBC PRESENT,BOTH PMN AND MONONUCLEAR NO ORGANISMS SEEN    Culture No growth aerobically or anaerobically.  Final   Report Status 09/26/2016 FINAL  Final         Radiology Studies: Ct Abdomen Pelvis W Contrast  Result Date: 09/29/2016 CLINICAL DATA:  Abscess EXAM: CT ABDOMEN AND PELVIS WITH CONTRAST TECHNIQUE: Multidetector CT imaging of the abdomen and pelvis was performed using the standard protocol following bolus administration of intravenous contrast. CONTRAST:  131mL ISOVUE-300 IOPAMIDOL (ISOVUE-300) INJECTION 61% COMPARISON:  09/20/2016 FINDINGS: Lower chest: Small left pleural effusion. Dependent bibasilar atelectasis. Heart is normal size. Hepatobiliary: Prior cholecystectomy.  No focal hepatic abnormality. Pancreas: No focal abnormality or ductal dilatation. Spleen: No focal abnormality.  Normal size. Adrenals/Urinary Tract: No adrenal abnormality. No focal renal abnormality. No stones  or hydronephrosis. Urinary bladder is unremarkable. Stomach/Bowel: Colon is mildly distended with fluid and stool. Small bowel and stomach unremarkable. No change. Vascular/Lymphatic: Aortic and iliac calcifications. No aneurysm or adenopathy. Reproductive: Heterogeneous enhancement of the uterus, possibly related to large fibroid. 7.1 cm left adnexal cystic mass, not significantly changed since prior study. Complex septated cystic structure in the right adnexa measures 4.7 x 3.5 cm, not significantly changed since prior study. Other: No free fluid or free air. Previously seen left lateral fluid collection has decreased in size he measures 2.7 x 0.8 cm compared to 4.2 x 2.2 cm previously. Right paracolic gutter fluid collection decreased, measuring 2.3 x 0.9 cm compared to 5.1 x 1.8 cm previously. Musculoskeletal: No acute bony abnormality. IMPRESSION: Fluid collections in the left lateral abdomen and right paracolic gutter have decreased in size since prior study. Bilateral cystic adnexal masses are unchanged since prior study. Small left pleural effusion.  Bibasilar atelectasis. Fluid filled mildly distended colon again noted, unchanged. Electronically Signed   By: Rolm Baptise M.D.   On: 09/29/2016 07:41        Scheduled Meds: . aspirin EC  81 mg Oral Daily  . clonazePAM  0.5 mg Oral BID  . docusate sodium  100 mg Oral BID  . enoxaparin (LOVENOX) injection  40 mg Subcutaneous Q24H  . feeding supplement  1 Container Oral TID BM  . insulin aspart  0-5 Units Subcutaneous QHS  . insulin aspart  0-9 Units Subcutaneous TID WC  . multivitamin with minerals  1 tablet Oral Daily  . propranolol ER  60 mg Oral Daily  . sodium chloride flush  10-40 mL Intracatheter Q12H   Continuous Infusions: . anidulafungin Stopped (09/29/16 1240)  . piperacillin-tazobactam (ZOSYN)  IV 3.375 g (09/30/16 0821)     LOS: 20 days    Time spent: 60 minutes    Clemie General, Geraldo Docker, MD [facility] Pager  [phone]   If 7PM-7AM, please contact night-coverage [email or web address] Password [email or web address] 09/30/2016, 8:28 AM

## 2016-09-30 NOTE — Progress Notes (Signed)
Physical Therapy Treatment Patient Details Name: Brandi Leonard MRN: [id number] DOB: [birth year] Today's Date: 09/30/2016    History of Present Illness Pt adm to [facility] with SBO and perforation/peritonitis and underwent repair on 6/12. Failed extubation post-op and developed ARDS. Transferred to [facility] on 6/15. Extubated 6/21. Pt underwent drainage of abd abcesses on 6/26.  PMH - copd, sbo, anxiety, cholecystitis    PT Comments    Patient unstable attempting ambulation without RW.  Feel she will benefit from HHPT at d/c due to weakness and poor balance.  PT to follow until d/c.    Follow Up Recommendations  Home health PT     Equipment Recommendations  Rolling walker with 5" wheels    Recommendations for Other Services       Precautions / Restrictions Precautions Precautions: Fall Precaution Comments: wound vac Restrictions Weight Bearing Restrictions: No    Mobility  Bed Mobility               General bed mobility comments: up in recliner  Transfers   Equipment used: Rolling walker (2 wheeled) Transfers: Sit to/from Stand Sit to Stand: Min guard;Min assist         General transfer comment: with walker stood with assist for lines and safety; to sit needed increased assist due to attempting without walker  Ambulation/Gait Ambulation/Gait assistance: Min guard;Min assist Ambulation Distance (Feet): 200 Feet Assistive device: Rolling walker (2 wheeled);None Gait Pattern/deviations: Step-through pattern;Decreased stride length;Trunk flexed     General Gait Details: slow pace, attempted to walk from doorway to chair no walker, needed min A for balance due to weakness   Stairs            Wheelchair Mobility    Modified Rankin (Stroke Patients Only)       Balance Overall balance assessment: Needs assistance Sitting-balance support: Feet supported Sitting balance-Leahy Scale: Good       Standing balance-Leahy Scale: Poor Standing  balance comment: unsteady without walker                            Cognition Arousal/Alertness: Awake/alert Behavior During Therapy: WFL for tasks assessed/performed Overall Cognitive Status: Within Functional Limits for tasks assessed                                        Exercises      General Comments        Pertinent Vitals/Pain Faces Pain Scale: Hurts a little bit Pain Location: abdomen Pain Descriptors / Indicators: Discomfort Pain Intervention(s): Monitored during session    Home Living                      Prior Function            PT Goals (current goals can now be found in the care plan section)      Frequency    Min 3X/week      PT Plan Current plan remains appropriate    Co-evaluation              AM-PAC PT "6 Clicks" Daily Activity  Outcome Measure  Difficulty turning over in bed (including adjusting bedclothes, sheets and blankets)?: None Difficulty moving from lying on back to sitting on the side of the bed? : A Little Difficulty sitting down on and standing up from  a chair with arms (e.g., wheelchair, bedside commode, etc,.)?: Total Help needed moving to and from a bed to chair (including a wheelchair)?: A Little Help needed walking in hospital room?: A Little Help needed climbing 3-5 steps with a railing? : A Little 6 Click Score: 17    End of Session   Activity Tolerance: Patient tolerated treatment well Patient left: in chair;with call bell/phone within reach;with family/visitor present   PT Visit Diagnosis: Unsteadiness on feet (R26.81);Muscle weakness (generalized) (M62.81)     Time: 3825-0539 PT Time Calculation (min) (ACUTE ONLY): 20 min  Charges:  $Gait Training: 8-22 mins                    G CodesMagda Kiel, [place] 09/30/2016    Reginia Naas 09/30/2016, 6:06 PM

## 2016-09-30 NOTE — Plan of Care (Signed)
Problem: Health Behavior/Discharge Planning: Goal: Ability to manage health-related needs will improve Outcome: Progressing Wanted purewick to stay in and morphine IV, plan to go home Friday converted to po pain med, and patient is getting up to BSC>   Problem: Nutrition: Goal: Adequate nutrition will be maintained Outcome: Not Progressing Eating very little, however this pm has had popscicle, and jello. Refusing resource drink  Problem: Bowel/Gastric: Goal: Will not experience complications related to bowel motility Outcome: Progressing Continues to have bloating. Had large BM today.   Problem: Activity: Goal: Ability to tolerate increased activity will improve Outcome: Progressing Walking in the hall  Problem: Skin Integrity Impairment Risk: Goal: Risk for impaired skin integrity will decrease Outcome: Progressing Now that she is getting up more and mobile

## 2016-09-30 NOTE — Consult Note (Signed)
[facility]    Date of Admission:  09/10/2016   Total days of antibiotics 12        Day 11 anidulafungin        Day 6 piperacillin tazobactam              Reason for Consult: Peritonitis with intra-abdominal abscesses    Referring Provider: Dr. Dia Crawford  Assessment: She developed peritonitis and intra-abdominal abscesses after a recent bout of small bowel obstruction. 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 She thought she was allergic to penicillin antibiotics but she has tolerated piperacillin tazobactam well. I recommend 2 more weeks of oral amoxicillin clavulanate. I would recommend follow-up CT scan in about 2 weeks.   Plan: 1. Discharge home on amoxicillin clavulanate 875 mg twice a day for 2 more weeks 2. I will arrange follow-up in my clinic   Principal Problem:   Intra-abdominal abscess ([place]) Active Problems:   Peritonitis ([place])   SBO (small bowel obstruction) ([place])   Encounter for central line placement   Septic shock ([place])   ARDS (adult respiratory distress syndrome) ([facility])   Severe protein-calorie malnutrition ([place])   Leukocytosis   Hypernatremia   Thrombocytosis ([place])   . aspirin EC  81 mg Oral Daily  . clonazePAM  0.5 mg Oral BID  . docusate sodium  100 mg Oral BID  . enoxaparin (LOVENOX) injection  40 mg Subcutaneous Q24H  . feeding supplement  1 Container Oral TID BM  . insulin aspart  0-5 Units Subcutaneous QHS  . insulin aspart  0-9 Units Subcutaneous TID WC  . multivitamin with minerals  1 tablet Oral Daily  . propranolol ER  60 mg Oral Daily  . sodium chloride flush  10-40 mL Intracatheter Q12H    HPI: Brandi Leonard is a 51 y.o. female with a previous history of abdominal surgeries and small bowel obstruction. She developed acute abdominal pain, distention, nausea and vomiting and was admitted to [facility] on  09/06/2016 with recurrent small bowel obstruction. The following day she developed perforation and an acute abdomen and underwent urgent exploratory laparotomy. She was extubated postoperatively but developed shock and respiratory failure. She was reintubated and transferred here. She received 8 days of antibiotic therapy and then it was stopped on 09/15/2016. 4 days later she began to have fever and a CT scan showed intra-abdominal abscesses in the right and left paracolic gutters. She underwent aspiration of both collections on 09/21/2016. Both Gram stains were negative. Culture of the right fluid collection was no growth. A sensitive Escherichia coli grew from the left fluid collection. Once the abscesses were detected she was started back on broad empiric antibiotic therapy. A CT scan done yesterday showed that she only has small residual abscesses on the right and left about 1 and shin diameter. She is feeling much better and eager to go home.   Review of Systems: Review of Systems  Constitutional: Positive for malaise/fatigue and weight loss. Negative for chills, diaphoresis and fever.  Gastrointestinal: Positive for abdominal pain. Negative for diarrhea, nausea and vomiting.    History reviewed. No pertinent past medical history.  Social History  Substance Use Topics  . Smoking status: Current Every Day Smoker    Packs/day: 1.00    Years: 33.00    Types: Cigarettes    Start date: 09/18/1985  . Smokeless  tobacco: Never Used  . Alcohol use Not on file    History reviewed. No pertinent family history. Allergies  Allergen Reactions  . Penicillins Rash    Has patient had a PCN reaction causing immediate rash, facial/tongue/throat swelling, SOB or lightheadedness with hypotension: Unknown Has patient had a PCN reaction causing severe rash involving mucus membranes or skin necrosis: Unknown Has patient had a PCN reaction that required hospitalization: No Has patient had a PCN reaction  occurring within the last 10 years: No If all of the above answers are "NO", then may proceed with Cephalosporin use.     OBJECTIVE: Blood pressure 115/85, pulse (!) 104, temperature 98 F (36.7 C), temperature source Oral, resp. rate 15, height 5\' 5"  (1.651 m), weight 113 lb 8.6 oz (51.5 kg), last menstrual period 09/16/2016, SpO2 96 %.  Physical Exam  Constitutional: She is oriented to person, place, and time. No distress.  She is sitting up in a chair watching television.  Cardiovascular: Normal rate and regular rhythm.   No murmur heard. Pulmonary/Chest: Effort normal and breath sounds normal. She has no wheezes. She has no rales.  Abdominal: Soft. She exhibits no distension. There is tenderness.  She has a VAC wound dressing on her midline incision.  Neurological: She is alert and oriented to person, place, and time.  Skin: No rash noted.  Psychiatric: Mood and affect normal.    Lab Results Lab Results  Component Value Date   WBC 8.6 09/30/2016   HGB 9.6 (L) 09/30/2016   HCT 32.7 (L) 09/30/2016   MCV 83.8 09/30/2016   PLT 597 (H) 09/30/2016    Lab Results  Component Value Date   CREATININE 0.48 09/30/2016   BUN <5 (L) 09/30/2016   NA 137 09/30/2016   K 3.9 09/30/2016   CL 101 09/30/2016   CO2 24 09/30/2016    Lab Results  Component Value Date   ALT 21 09/30/2016   AST 19 09/30/2016   ALKPHOS 123 09/30/2016   BILITOT QUANTITY NOT SUFFICIENT, UNABLE TO PERFORM TEST 09/30/2016     Microbiology: Recent Results (from the past 240 hour(s))  Aerobic/Anaerobic Culture (surgical/deep wound)     Status: None   Collection Time: 09/21/16  4:15 PM  Result Value Ref Range Status   Specimen Description ABDOMEN LEFT  Final   Special Requests Normal  Final   Gram Stain   Final    ABUNDANT WBC PRESENT,BOTH PMN AND MONONUCLEAR RARE GRAM NEGATIVE RODS    Culture FEW ESCHERICHIA COLI NO ANAEROBES ISOLATED   Final   Report Status 09/26/2016 FINAL  Final   Organism ID,  Bacteria ESCHERICHIA COLI  Final      Susceptibility   Escherichia coli - MIC*    AMPICILLIN 8 SENSITIVE Sensitive     CEFAZOLIN <=4 SENSITIVE Sensitive     CEFEPIME <=1 SENSITIVE Sensitive     CEFTAZIDIME <=1 SENSITIVE Sensitive     CEFTRIAXONE <=1 SENSITIVE Sensitive     CIPROFLOXACIN >=4 RESISTANT Resistant     GENTAMICIN <=1 SENSITIVE Sensitive     IMIPENEM <=0.25 SENSITIVE Sensitive     TRIMETH/SULFA <=20 SENSITIVE Sensitive     AMPICILLIN/SULBACTAM 4 SENSITIVE Sensitive     PIP/TAZO <=4 SENSITIVE Sensitive     Extended ESBL NEGATIVE Sensitive     * FEW ESCHERICHIA COLI  Aerobic/Anaerobic Culture (surgical/deep wound)     Status: None   Collection Time: 09/21/16  4:16 PM  Result Value Ref Range Status   Specimen Description ABDOMEN  RIGHT  Final   Special Requests Normal  Final   Gram Stain   Final    ABUNDANT WBC PRESENT,BOTH PMN AND MONONUCLEAR NO ORGANISMS SEEN    Culture No growth aerobically or anaerobically.  Final   Report Status 09/26/2016 FINAL  Final    Michel Bickers, MD [facility] [place] [facility] [phone] pager   [phone] cell 09/30/2016, 1:27 PM

## 2016-09-30 NOTE — Progress Notes (Signed)
Nutrition Follow-up  DOCUMENTATION CODES:   Severe malnutrition in context of acute illness/injury  INTERVENTION:   -D/c Boost Breeze -Snacks TID -Continue MVI  NUTRITION DIAGNOSIS:   Malnutrition (Severe) related to  (SBO w/ perferation s/p SBR) as evidenced by severe fluid accumulation, energy intake < or equal to 50% for > or equal to 5 days.  Ongoing  GOAL:   Patient will meet greater than or equal to 90% of their needs  Progressing  MONITOR:   PO intake, Supplement acceptance, Labs, Weight trends, Skin, I & O's  REASON FOR ASSESSMENT:   Consult Enteral/tube feeding initiation and management  ASSESSMENT:   51 yo female with no PMH who was admitted to [facility] on 6/11 with SBO, s/p emergent laparotomy 6/12 for perforation with peritonitis. She developed ARDS and septic shock after surgery. Transferred to [facility] on 6/15 for further care.  Spoke with pt, who was sitting in recline at time of visit. Pt in good spirits today. She reports her appetite is slowly improving- consumed fruit, bacon, and toast this morning. Meal completion averaging around 25%. Pt does not like the taste of Boost Breeze supplement.   Pt still with wound vac at time of visit, however, plan to transition to wet to dry dressings at discharge. Pt verbalized that she will likely discharge to home tomorrow.   Discussed with pt importance of good meal intake (especially protein) to promote healing. Discussed ways pt could increase calories and protein intake in diet, including reviewing sources of protein and consuming small, frequent meals throughout the day.   Labs reviewed: CBGS: 85-135.   Diet Order:  Diet regular Room service appropriate? Yes; Fluid consistency: Thin  Skin:  Wound (see comment) (wound vac to abdominal incision)  Last BM:  09/26/16  Height:   Ht Readings from Last 1 Encounters:  09/10/16 5\' 5"  (1.651 m)    Weight:   Wt Readings from Last 1 Encounters:  09/29/16 113 lb 8.6  oz (51.5 kg)    Ideal Body Weight:  56.82 kg  BMI:  Body mass index is 18.89 kg/m.  Estimated Nutritional Needs:   Kcal:  1800-2000  Protein:  95-110 gm  Fluid:  1.8-2 L  EDUCATION NEEDS:   Education needs addressed  Whittley Carandang A. Jimmye Norman, RD, LDN, CDE Pager: [phone] After hours Pager: [phone]

## 2016-09-30 NOTE — Progress Notes (Signed)
Subjective/Chief Complaint: no appetite Ok  Today bowel moving    Objective: Vital signs in last 24 hours: Temp:  [97.6 F (36.4 C)-98.6 F (37 C)] 98 F (36.7 C) (07/05 0809) Pulse Rate:  [87-115] 104 (07/05 0809) Resp:  [14-19] 15 (07/05 0809) BP: (96-115)/(72-85) 115/85 (07/05 0809) SpO2:  [94 %-97 %] 96 % (07/05 0809) Last BM Date: 09/29/16  Intake/Output from previous day: 07/04 0701 - 07/05 0700 In: 1260 [P.O.:1260] Out: 2850 [Urine:2100; Stool:750] Intake/Output this shift: No intake/output data recorded.  Incision/Wound:vac in place   Soft ND  Lab Results:   Recent Labs  09/30/16 0231  WBC 8.6  HGB 9.6*  HCT 32.7*  PLT 597*   BMET  Recent Labs  09/28/16 0622 09/30/16 0231  NA 137 137  K 3.7 3.9  CL 101 101  CO2 23 24  GLUCOSE 98 102*  BUN 6 <5*  CREATININE 0.45 0.48  CALCIUM 8.8* 9.1   PT/INR No results for input(s): LABPROT, INR in the last 72 hours. ABG No results for input(s): PHART, HCO3 in the last 72 hours.  Invalid input(s): PCO2, PO2  Studies/Results: Ct Abdomen Pelvis W Contrast  Result Date: 09/29/2016 CLINICAL DATA:  Abscess EXAM: CT ABDOMEN AND PELVIS WITH CONTRAST TECHNIQUE: Multidetector CT imaging of the abdomen and pelvis was performed using the standard protocol following bolus administration of intravenous contrast. CONTRAST:  138mL ISOVUE-300 IOPAMIDOL (ISOVUE-300) INJECTION 61% COMPARISON:  09/20/2016 FINDINGS: Lower chest: Small left pleural effusion. Dependent bibasilar atelectasis. Heart is normal size. Hepatobiliary: Prior cholecystectomy.  No focal hepatic abnormality. Pancreas: No focal abnormality or ductal dilatation. Spleen: No focal abnormality.  Normal size. Adrenals/Urinary Tract: No adrenal abnormality. No focal renal abnormality. No stones or hydronephrosis. Urinary bladder is unremarkable. Stomach/Bowel: Colon is mildly distended with fluid and stool. Small bowel and stomach unremarkable. No change.  Vascular/Lymphatic: Aortic and iliac calcifications. No aneurysm or adenopathy. Reproductive: Heterogeneous enhancement of the uterus, possibly related to large fibroid. 7.1 cm left adnexal cystic mass, not significantly changed since prior study. Complex septated cystic structure in the right adnexa measures 4.7 x 3.5 cm, not significantly changed since prior study. Other: No free fluid or free air. Previously seen left lateral fluid collection has decreased in size he measures 2.7 x 0.8 cm compared to 4.2 x 2.2 cm previously. Right paracolic gutter fluid collection decreased, measuring 2.3 x 0.9 cm compared to 5.1 x 1.8 cm previously. Musculoskeletal: No acute bony abnormality. IMPRESSION: Fluid collections in the left lateral abdomen and right paracolic gutter have decreased in size since prior study. Bilateral cystic adnexal masses are unchanged since prior study. Small left pleural effusion.  Bibasilar atelectasis. Fluid filled mildly distended colon again noted, unchanged. Electronically Signed   By: Rolm Baptise M.D.   On: 09/29/2016 07:41    Anti-infectives: Anti-infectives    Start     Dose/Rate Route Frequency Ordered Stop   09/25/16 1600  piperacillin-tazobactam (ZOSYN) IVPB 3.375 g     3.375 g 12.5 mL/hr over 240 Minutes Intravenous Every 8 hours 09/25/16 1409     09/21/16 2230  vancomycin (VANCOCIN) IVPB 1000 mg/200 mL premix  Status:  Discontinued     1,000 mg 200 mL/hr over 60 Minutes Intravenous Every 8 hours 09/21/16 2210 09/22/16 1102   09/21/16 1100  anidulafungin (ERAXIS) 100 mg in sodium chloride 0.9 % 100 mL IVPB     100 mg 78 mL/hr over 100 Minutes Intravenous Every 24 hours 09/20/16 1048     09/20/16  1100  anidulafungin (ERAXIS) 200 mg in sodium chloride 0.9 % 200 mL IVPB     200 mg 78 mL/hr over 200 Minutes Intravenous  Once 09/20/16 1048 09/20/16 1510   09/20/16 0145  vancomycin (VANCOCIN) IVPB 750 mg/150 ml premix  Status:  Discontinued     750 mg 150 mL/hr over 60  Minutes Intravenous Every 12 hours 09/20/16 0135 09/21/16 2210   09/19/16 0800  meropenem (MERREM) 1 g in sodium chloride 0.9 % 100 mL IVPB  Status:  Discontinued     1 g 200 mL/hr over 30 Minutes Intravenous Every 8 hours 09/19/16 0738 09/25/16 1409   09/11/16 1300  metroNIDAZOLE (FLAGYL) IVPB 500 mg  Status:  Discontinued     500 mg 100 mL/hr over 60 Minutes Intravenous Every 8 hours 09/11/16 1208 09/16/16 0912   09/11/16 1300  meropenem (MERREM) 1 g in sodium chloride 0.9 % 100 mL IVPB  Status:  Discontinued     1 g 200 mL/hr over 30 Minutes Intravenous Every 8 hours 09/11/16 1259 09/16/16 0912      Assessment/Plan: Patient Active Problem List   Diagnosis Date Noted  . Hypernatremia 09/20/2016  . Thrombocytosis ([place]) 09/20/2016  . ARDS (adult respiratory distress syndrome) ([place]) 09/18/2016  . Diarrhea 09/18/2016  . Severe protein-calorie malnutrition ([place]) 09/18/2016  . Leukocytosis 09/18/2016  . Peritonitis ([place]) 09/18/2016  . Encounter for central line placement   . Septic shock ([facility])     OK FOR DISCHARGE  NO WOUND VAC WET TO DRY DRESSING CHANGES bid FOLLOW UP IN OFFICE IN [place] IN 3 - 4 WEEKS     LOS: 20 days    Janylah Belgrave A. 09/30/2016

## 2016-10-01 DIAGNOSIS — K651 Peritoneal abscess: Secondary | ICD-10-CM

## 2016-10-01 LAB — BASIC METABOLIC PANEL
Anion gap: 10 (ref 5–15)
BUN: 5 mg/dL — AB (ref 6–20)
CHLORIDE: 103 mmol/L (ref 101–111)
CO2: 24 mmol/L (ref 22–32)
Calcium: 8.9 mg/dL (ref 8.9–10.3)
Creatinine, Ser: 0.45 mg/dL (ref 0.44–1.00)
Glucose, Bld: 106 mg/dL — ABNORMAL HIGH (ref 65–99)
POTASSIUM: 3.9 mmol/L (ref 3.5–5.1)
SODIUM: 137 mmol/L (ref 135–145)

## 2016-10-01 LAB — GLUCOSE, CAPILLARY: GLUCOSE-CAPILLARY: 110 mg/dL — AB (ref 65–99)

## 2016-10-01 LAB — HEMOGLOBIN A1C
HEMOGLOBIN A1C: 5.7 % — AB (ref 4.8–5.6)
Mean Plasma Glucose: 117 mg/dL

## 2016-10-01 LAB — MAGNESIUM: MAGNESIUM: 1.7 mg/dL (ref 1.7–2.4)

## 2016-10-01 MED ORDER — ASPIRIN 81 MG PO TBEC: 81.0000 mg | DELAYED_RELEASE_TABLET | Freq: Every day | ORAL | Status: AC

## 2016-10-01 MED ORDER — ACETAMINOPHEN 325 MG PO TABS: 650.0000 mg | ORAL_TABLET | ORAL | Status: AC | PRN

## 2016-10-01 MED ORDER — ONDANSETRON HCL 4 MG PO TABS: 4.0000 mg | ORAL_TABLET | Freq: Every day | ORAL | 0 refills | Status: AC | PRN

## 2016-10-01 MED ORDER — OXYCODONE HCL 5 MG PO TABS: 5.0000 mg | ORAL_TABLET | ORAL | 0 refills | Status: AC | PRN

## 2016-10-01 MED ORDER — CLONAZEPAM 0.5 MG PO TABS: 0.5000 mg | ORAL_TABLET | Freq: Two times a day (BID) | ORAL | 0 refills | Status: AC

## 2016-10-01 MED ORDER — DOCUSATE SODIUM 100 MG PO CAPS: 100.0000 mg | ORAL_CAPSULE | Freq: Two times a day (BID) | ORAL | 0 refills | Status: AC

## 2016-10-01 MED ORDER — AMOXICILLIN-POT CLAVULANATE 875-125 MG PO TABS: 1.0000 | ORAL_TABLET | Freq: Two times a day (BID) | ORAL | 0 refills | Status: AC

## 2016-10-01 MED ORDER — PROPRANOLOL HCL ER 60 MG PO CP24: 60.0000 mg | ORAL_CAPSULE | Freq: Every day | ORAL | 0 refills | Status: AC

## 2016-10-01 NOTE — Progress Notes (Signed)
Brandi Leonard to be D/C'd  per MD order. Discussed with the patient and all questions fully answered.  VSS, Skin clean, dry and intact without evidence of skin break down, no evidence of skin tears noted.  IV catheter discontinued intact. Site without signs and symptoms of complications. Dressing and pressure applied.  An After Visit Summary was printed and given to the patient. Patient received prescription.  D/c education completed with patient/family including follow up instructions, medication list, d/c activities limitations if indicated, with other d/c instructions as indicated by MD - patient able to verbalize understanding, all questions fully answered.   Patient instructed to return to ED, call 911, or call MD for any changes in condition.   Patient to be escorted via [place], and D/C home via private auto.  Patient given wound care supplies to last a few days until home health can come to house. Did teaching with patient on how to change dressing this a.m.

## 2016-10-01 NOTE — Discharge Summary (Addendum)
DISCHARGE SUMMARY  Brandi Leonard  MR#: [id number]  DOB:1966/01/19  Date of Admission: 09/10/2016 Date of Discharge: 10/01/2016  Attending Physician:MCCLUNG,JEFFREY T  Patient's PCP:No primary care provider on file.  Consults: Gen Surgery ID  PCCM  Disposition: D/C home w/ HH  Wound Care:  Wet > dry dressing w/ changes BID  Follow-up Appts: Follow-up Information    Surgery, [place]. Call in 1 day(s).   Specialty:  General Surgery Why:  Call to arrange a follow up appointment in the [place] office in 3-4 weeks  Contact information: [address] [place] [address] [phone]        [facility]. Schedule an appointment as soon as possible for a visit in 1 week(s).   Why:  You need to be seen by the Primary Care Doctor of your choice within 10-14 days to assess your need for ongoing use of pain meds or benzodiazepines (Klonopin) - you may use the PCP of your choice but it is key that you be seen in follow up Contact information: [place] [address] [phone]       [facility]             . Call.   Why:  Call to schedule an appointment with Dr. Megan Salon in 10-14 days.   Contact information: [address] [place] [id number]          Tests Needing Follow-up: - assessment of her two intra-abdominal fluid collections and determination of length of abx tx will be indicated  - f/u of adrenal fxn once returned to baseline health suggested   - assessment of need for ongoing BB use  - pt has been advised that long term benzo and/or narcotic dosing can only be provided per a PCP   Discharge Diagnoses: Acute hypoxic respiratory failure - aspiration pneumonia - ARDS Small bowel obstruction with perforation - septic shock Abdominal fluid collections Hyperglycemia Cardiogenic shock - demand ischemia Acute diastolic  congestive heart failure Shock liver Severe protein calorie malnutrition Anemia of critical illness Relative adrenal insufficiency Seizure - benzodiazepine withdrawal  Initial presentation: 51yo F w/ no known med hx who was originally admitted to [facility] 6/11 with a small bowel obstruction.  She underwent emergent laparotomy on 6/12 for perforation with peritonitis, but failed extubation postoperatively and then developed diffuse bilateral infiltrates consistent with ARDS and septic shock requiring Levophed.  She was able to be extubated 6/21, but on 6/25 developed worsening respiratory failure, ?aspiration v/s HCAP.    Hospital Course:  Acute hypoxic respiratory failure - aspiration pneumonia - ARDS Resolved  Small bowel obstruction with perforation - septic shock Gen Surgery has cleared her for d/c - tolerating diet and moving bowels   Abdominal fluid collections s/p CT guided drainage of small bilateral abd abscesses 6/26 - cultures have revealed pan-sensitive E coli - transitioned to oral abx at time of d/c and continue for a planned 2 week course w/ f/u in the ID Clinic w/ Dr. Megan Salon - CT 7/4 noted decrease in size, but both collections still present   Hyperglycemia No reported hx of DM - A1c NOT c/w DM at 5.7  Cardiogenic shock - demand ischemia Resolved - due to severe sepsis in setting of peritonitis  Acute diastolic congestive heart failure Clinically stable w/ no evidence of volume overload on exam - avoiding lasix in setting of poor oral intake  Filed  Weights   09/27/16 0316 09/28/16 0344 09/29/16 0340  Weight: 50.6 kg (111 lb 8.8 oz) 50.9 kg (112 lb 3.4 oz) 51.5 kg (113 lb 8.6 oz)    Shock liver Resolved   Severe protein calorie malnutrition Intake improving - Nutrition evaluated during hospital stay   Anemia of critical illness Hgb stable at ~9.5 at time of d/c   Relative adrenal insufficiency Cortisol 12 in midst of acute critical illness - not  requiring steroid tx at time of d/c - f/u of adrenal fxn once returned to baseline health suggested    Seizure - benzodiazepine withdrawal Stable on low dose klonopin    Allergies as of 10/01/2016      Reactions   Penicillins Rash   Has patient had a PCN reaction causing immediate rash, facial/tongue/throat swelling, SOB or lightheadedness with hypotension: Unknown Has patient had a PCN reaction causing severe rash involving mucus membranes or skin necrosis: Unknown Has patient had a PCN reaction that required hospitalization: No Has patient had a PCN reaction occurring within the last 10 years: No If all of the above answers are "NO", then may proceed with Cephalosporin use.      Medication List    STOP taking these medications   amphetamine-dextroamphetamine 20 MG tablet Commonly known as:  ADDERALL   montelukast 10 MG tablet Commonly known as:  SINGULAIR   OLANZapine-FLUoxetine 6-25 MG capsule Commonly known as:  SYMBYAX   oxyCODONE-acetaminophen 10-325 MG tablet Commonly known as:  PERCOCET     TAKE these medications   acetaminophen 325 MG tablet Commonly known as:  TYLENOL Take 2 tablets (650 mg total) by mouth every 4 (four) hours as needed for mild pain, fever or headache.   amoxicillin-clavulanate 875-125 MG tablet Commonly known as:  AUGMENTIN Take 1 tablet by mouth every 12 (twelve) hours.   aspirin 81 MG EC tablet Take 1 tablet (81 mg total) by mouth daily.   clonazePAM 0.5 MG tablet Commonly known as:  KLONOPIN Take 1 tablet (0.5 mg total) by mouth 2 (two) times daily. What changed:  medication strength  how much to take  when to take this   docusate sodium 100 MG capsule Commonly known as:  COLACE Take 1 capsule (100 mg total) by mouth 2 (two) times daily.   ibuprofen 800 MG tablet Commonly known as:  ADVIL,MOTRIN Take 800 mg by mouth 3 (three) times daily as needed for cramping.   ondansetron 4 MG tablet Commonly known as:  ZOFRAN Take 1  tablet (4 mg total) by mouth daily as needed for nausea or vomiting.   oxyCODONE 5 MG immediate release tablet Commonly known as:  Oxy IR/ROXICODONE Take 1-2 tablets (5-10 mg total) by mouth every 4 (four) hours as needed for severe pain.   propranolol ER 60 MG 24 hr capsule Commonly known as:  INDERAL LA Take 1 capsule (60 mg total) by mouth daily.            Durable Medical Equipment        Start     Ordered   10/01/16 [phone]  For home use only DME Walker rolling  Once    Comments:  5" wheels  Question:  Patient needs a walker to treat with the following condition  Answer:  Physical deconditioning   10/01/16 0933      Day of Discharge BP 118/78   Pulse 95   Temp 98.9 F (37.2 C)   Resp 16   Ht 5\' 5"  (1.651 m)  Wt 51.5 kg (113 lb 8.6 oz)   LMP 09/16/2016   SpO2 95%   BMI 18.89 kg/m   Physical Exam: General: No acute respiratory distress Lungs: Clear to auscultation bilaterally without wheezes or crackles Cardiovascular: Regular rate and rhythm without murmur gallop or rub normal S1 and S2 Abdomen: Nontender, nondistended, soft, bowel sounds positive, no rebound, no ascites, no appreciable mass Extremities: No significant cyanosis, clubbing, or edema bilateral lower extremities  Basic Metabolic Panel:  Recent Labs Lab 09/25/16 1228 09/26/16 0348 09/27/16 0346 09/28/16 0622 09/29/16 0808 09/30/16 0231 09/30/16 0529 10/01/16 0512  NA 138 136 139 137  --  137  --  137  K 3.0* 3.4* 4.3 3.7  --  3.9  --  3.9  CL 100* 102 105 101  --  101  --  103  CO2 27 25 25 23   --  24  --  24  GLUCOSE 131* 162* 107* 98  --  102*  --  106*  BUN <5* <5* <5* 6  --  <5*  --  5*  CREATININE 0.42* 0.52 0.41* 0.45  --  0.48  --  0.45  CALCIUM 8.4* 8.3* 8.9 8.8*  --  9.1  --  8.9  MG 1.8  --   --   --  1.6*  --  1.8 1.7    Liver Function Tests:  Recent Labs Lab 09/25/16 1228 09/30/16 0231  AST 21 19  ALT 32 21  ALKPHOS 204* 123  BILITOT 0.3 QUANTITY NOT SUFFICIENT,  UNABLE TO PERFORM TEST  PROT 6.0* 6.1*  ALBUMIN 2.0* 2.2*    CBC:  Recent Labs Lab 09/25/16 1228 09/26/16 0348 09/27/16 0346 09/30/16 0231  WBC 10.1 9.4 11.9* 8.6  NEUTROABS 7.9*  --   --   --   HGB 10.8* 9.8* 10.4* 9.6*  HCT 35.2* 31.9* 33.5* 32.7*  MCV 81.1 81.8 82.5 83.8  PLT 633* 568* 573* 597*    CBG:  Recent Labs Lab 09/30/16 1319 09/30/16 1542 09/30/16 1726 09/30/16 2204 10/01/16 0733  GLUCAP 105* 148* 153* 264* 110*    Recent Results (from the past 240 hour(s))  Aerobic/Anaerobic Culture (surgical/deep wound)     Status: None   Collection Time: 09/21/16  4:15 PM  Result Value Ref Range Status   Specimen Description ABDOMEN LEFT  Final   Special Requests Normal  Final   Gram Stain   Final    ABUNDANT WBC PRESENT,BOTH PMN AND MONONUCLEAR RARE GRAM NEGATIVE RODS    Culture FEW ESCHERICHIA COLI NO ANAEROBES ISOLATED   Final   Report Status 09/26/2016 FINAL  Final   Organism ID, Bacteria ESCHERICHIA COLI  Final      Susceptibility   Escherichia coli - MIC*    AMPICILLIN 8 SENSITIVE Sensitive     CEFAZOLIN <=4 SENSITIVE Sensitive     CEFEPIME <=1 SENSITIVE Sensitive     CEFTAZIDIME <=1 SENSITIVE Sensitive     CEFTRIAXONE <=1 SENSITIVE Sensitive     CIPROFLOXACIN >=4 RESISTANT Resistant     GENTAMICIN <=1 SENSITIVE Sensitive     IMIPENEM <=0.25 SENSITIVE Sensitive     TRIMETH/SULFA <=20 SENSITIVE Sensitive     AMPICILLIN/SULBACTAM 4 SENSITIVE Sensitive     PIP/TAZO <=4 SENSITIVE Sensitive     Extended ESBL NEGATIVE Sensitive     * FEW ESCHERICHIA COLI  Aerobic/Anaerobic Culture (surgical/deep wound)     Status: None   Collection Time: 09/21/16  4:16 PM  Result Value Ref Range Status  Specimen Description ABDOMEN RIGHT  Final   Special Requests Normal  Final   Gram Stain   Final    ABUNDANT WBC PRESENT,BOTH PMN AND MONONUCLEAR NO ORGANISMS SEEN    Culture No growth aerobically or anaerobically.  Final   Report Status 09/26/2016 FINAL  Final     Time spent in discharge (includes decision making & examination of pt): 35 minutes  10/01/2016, 10:04 AM   Cherene Altes, MD [facility] Office  [phone] Pager [phone]  On-Call/Text Page:      [email or web address]      password [facility]

## 2016-10-01 NOTE — Discharge Instructions (Signed)
Small Bowel Obstruction A small bowel obstruction is a blockage in the small bowel. The small bowel, which is also called the small intestine, is a Pelley, slender tube that connects the stomach to the colon. When a person eats and drinks, food and fluids go from the stomach to the small bowel. This is where most of the nutrients in the food and fluids are absorbed. A small bowel obstruction will prevent food and fluids from passing through the small bowel as they normally do during digestion. The small bowel can become partially or completely blocked. This can cause symptoms such as abdominal pain, vomiting, and bloating. If this condition is not treated, it can be dangerous because the small bowel could rupture. What are the causes? Common causes of this condition include:  Scar tissue from previous surgery or radiation treatment.  Recent surgery. This may cause the movements of the bowel to slow down and cause food to block the intestine.  Hernias.  Inflammatory bowel disease (colitis).  Twisting of the bowel (volvulus).  Tumors.  A foreign body.  Slipping of a part of the bowel into another part (intussusception).  What are the signs or symptoms? Symptoms of this condition include:  Abdominal pain. This may be dull cramps or sharp pain. It may occur in one area, or it may be present in the entire abdomen. Pain can range from mild to severe, depending on the degree of obstruction.  Nausea and vomiting. Vomit may be greenish or a yellow bile color.  Abdominal bloating.  Constipation.  Lack of passing gas.  Frequent belching.  Diarrhea. This may occur if the obstruction is partial and runny stool is able to leak around the obstruction.  How is this diagnosed? This condition may be diagnosed based on a physical exam, medical history, and X-rays of the abdomen. You may also have other tests, such as a CT scan of the abdomen and pelvis. How is this treated? Treatment for this  condition depends on the cause and severity of the problem. Treatment options may include:  Bed rest along with fluids and pain medicines that are given through an IV tube inserted into one of your veins. Sometimes, this is all that is needed for the obstruction to improve.  Following a simple diet. In some cases, a clear liquid diet may be required for several days. This allows the bowel to rest.  Placement of a small tube (nasogastric tube) into the stomach. When the bowel is blocked, it usually swells up like a balloon that is filled with air and fluids. The air and fluids may be removed by suction through the nasogastric tube. This can help with pain, discomfort, and nausea. It can also help the obstruction to clear up faster.  Surgery. This may be required if other treatments do not work. Bowel obstruction from a hernia may require early surgery and can be an emergency procedure. Surgery may also be required for scar tissue that causes frequent or severe obstructions.  Follow these instructions at home:  Get plenty of rest.  Follow instructions from your health care provider about eating restrictions. You may need to avoid solid foods and consume only clear liquids until your condition improves.  Take over-the-counter and prescription medicines only as told by your health care provider.  Keep all follow-up visits as told by your health care provider. This is important. Contact a health care provider if:  You have a fever.  You have chills. Get help right away if:  You have increased pain or cramping.  You vomit blood.  You have uncontrolled vomiting or nausea.  You cannot drink fluids because of vomiting or pain.  You develop confusion.  You begin feeling very dry or thirsty (dehydrated).  You have severe bloating.  You feel extremely weak or you faint. This information is not intended to replace advice given to you by your health care provider. Make sure you discuss any  questions you have with your health care provider. Document Released: 06/01/2005 Document Revised: 11/10/2015 Document Reviewed: 05/09/2014 Elsevier Interactive Patient Education  2017 [place], Adult Taking care of your wound properly can help to prevent pain and infection. It can also help your wound to heal more quickly. How is this treated? Wound care  Follow instructions from your health care provider about how to take care of your wound. Make sure you: ? Wash your hands with soap and water before you change the bandage (dressing). If soap and water are not available, use hand sanitizer. ? Change your dressing as told by your health care provider. ? Leave stitches (sutures), skin glue, or adhesive strips in place. These skin closures may need to stay in place for 2 weeks or longer. If adhesive strip edges start to loosen and curl up, you may trim the loose edges. Do not remove adhesive strips completely unless your health care provider tells you to do that.  Check your wound area every day for signs of infection. Check for: ? More redness, swelling, or pain. ? More fluid or blood. ? Warmth. ? Pus or a bad smell.  Ask your health care provider if you should clean the wound with mild soap and water. Doing this may include: ? Using a clean towel to pat the wound dry after cleaning it. Do not rub or scrub the wound. ? Applying a cream or ointment. Do this only as told by your health care provider. ? Covering the incision with a clean dressing.  Ask your health care provider when you can leave the wound uncovered. Medicines   If you were prescribed an antibiotic medicine, cream, or ointment, take or use the antibiotic as told by your health care provider. Do not stop taking or using the antibiotic even if your condition improves.  Take over-the-counter and prescription medicines only as told by your health care provider. If you were prescribed pain medicine, take it  at least 30 minutes before doing any wound care or as told by your health care provider. General instructions  Return to your normal activities as told by your health care provider. Ask your health care provider what activities are safe.  Do not scratch or pick at the wound.  Keep all follow-up visits as told by your health care provider. This is important.  Eat a diet that includes protein, vitamin A, vitamin C, and other nutrient-rich foods. These help the wound heal: ? Protein-rich foods include meat, dairy, beans, nuts, and other sources. ? Vitamin A-rich foods include carrots and dark green, leafy vegetables. ? Vitamin C-rich foods include citrus, tomatoes, and other fruits and vegetables. ? Nutrient-rich foods have protein, carbohydrates, fat, vitamins, or minerals. Eat a variety of healthy foods including vegetables, fruits, and whole grains. Contact a health care provider if:  You received a tetanus shot and you have swelling, severe pain, redness, or bleeding at the injection site.  Your pain is not controlled with medicine.  You have more redness, swelling, or pain around the  wound.  You have more fluid or blood coming from the wound.  Your wound feels warm to the touch.  You have pus or a bad smell coming from the wound.  You have a fever or chills.  You are nauseous or you vomit.  You are dizzy. Get help right away if:  You have a red streak going away from your wound.  The edges of the wound open up and separate.  Your wound is bleeding and the bleeding does not stop with gentle pressure.  You have a rash.  You faint.  You have trouble breathing. This information is not intended to replace advice given to you by your health care provider. Make sure you discuss any questions you have with your health care provider. Document Released: 12/23/2007 Document Revised: 11/12/2015 Document Reviewed: 09/30/2015 Elsevier Interactive Patient Education  2017 [employer].

## 2016-10-01 NOTE — Care Management Note (Signed)
Case Management Note  Patient Details  Name: Brandi Leonard MRN: [id number] Date of Birth: 01-20-1966  Subjective/Objective:     51 yr old female s/p small bowel obstruction, was at [facility], Transferred to [facility] for further intensive treatment.             Action/Plan: Case manager spoke with patient concerning discharge plan and DME needs. CM informed bedside RN that patient could be discharged while Home Health was being established. CM confirmed patient's contact number [phone], she asked that her mom becalled at [phone], Matthias Hughs.  CM contacted  Patient's BC/BS CM Marja Kays [phone] ext [phone] concerning locating agency. Per patient contract she  has 100 nursing visits per calendar year and 20 PT visits per calendar yr available. CM contacted [facility] with referral. Faxed orders, Demographics , H&P to Abigail Butts at [phone].  Case manager will contact patient and her mom to notify them that start of care will be Monday, October 04, 2016. Patient was instructed on how to perform her dressing changes by bedside RN.   Expected Discharge Date:  10/01/16               Expected Discharge Plan:  [place]  In-House Referral:  NA  Discharge planning Services  CM Consult  Post Acute Care Choice:  Durable Medical Equipment, Home Health Choice offered to:  Patient  DME Arranged:  Walker rolling DME Agency:  [facility] Arranged:  RN, PT, OT [facility] Agency:  [place]  Status of Service:completed.   If discussed at [place] of Stay Meetings, dates discussed:    Additional Comments:  Ninfa Meeker, RN 10/01/2016, 1:27 PM

## 2016-10-18 ENCOUNTER — Ambulatory Visit: Payer: BLUE CROSS/BLUE SHIELD | Admitting: Internal Medicine

## 2016-10-20 ENCOUNTER — Telehealth: Payer: Self-pay | Admitting: Pulmonary Disease

## 2016-10-21 NOTE — Telephone Encounter (Signed)
Brandi Leonard, please advise when this has been complete. Thanks.

## 2016-10-22 NOTE — Telephone Encounter (Signed)
After reviewing forms, JN declined to complete. He stated the forms will need to be completed by pt PCP.  Attempted to contact patient, but home number I not working. Nothing further is needed.

## 2018-04-03 IMAGING — CT CT ABD-PELV W/ CM
2 of 5 series · 16 of 46 positions shown, 18 images · IV contrast (APPLIED)
Comparison: 09/20/2016

CLINICAL DATA: Abscess

EXAM:
CT ABDOMEN AND PELVIS WITH CONTRAST
TECHNIQUE: Multidetector CT imaging of the abdomen and pelvis was performed
using the standard protocol following bolus administration of
intravenous contrast.
CONTRAST:  100mL 0911AJ-TTT IOPAMIDOL (0911AJ-TTT) INJECTION 61%

[Series 3: abdomen 5.0 · axial · 0.70mm/px · z∈[+719,+1139]mm · 13 of 96 slices shown, 15 images]
[im 6/96  soft-tissue]
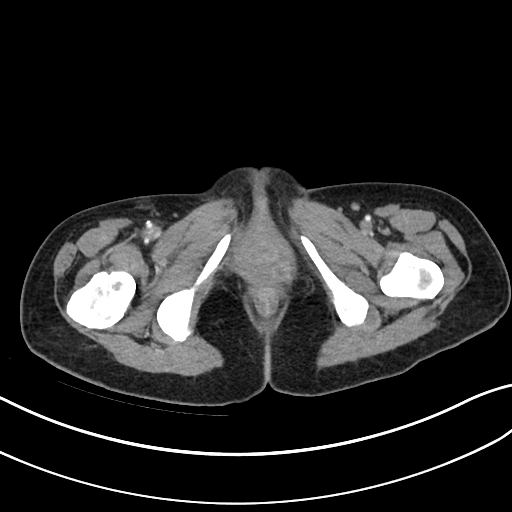
[im 6/96  bone]
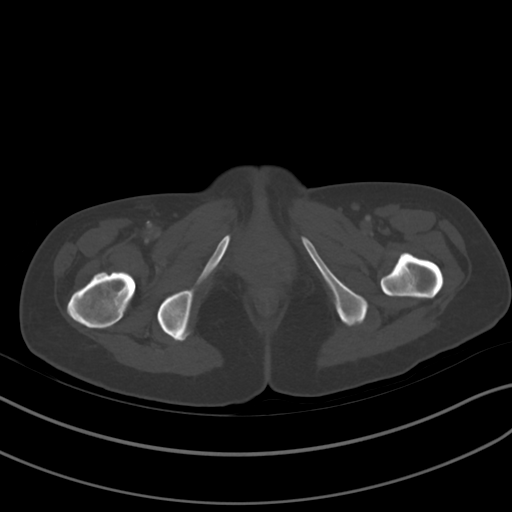
[im 12/96  soft-tissue]
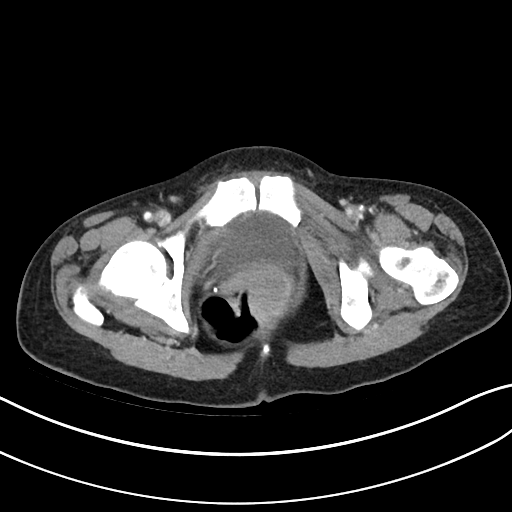
[im 18/96  soft-tissue]
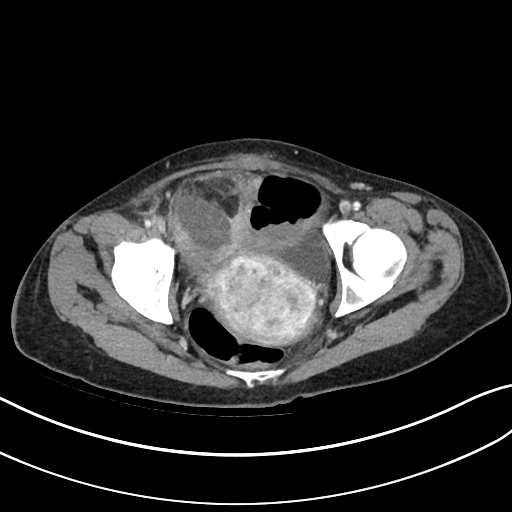
[im 30/96  soft-tissue]
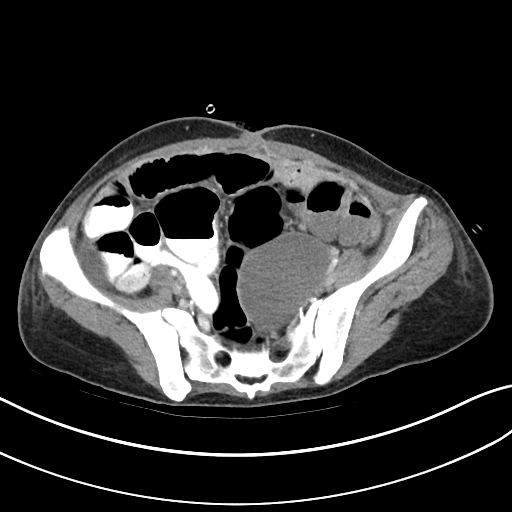
[im 36/96  soft-tissue]
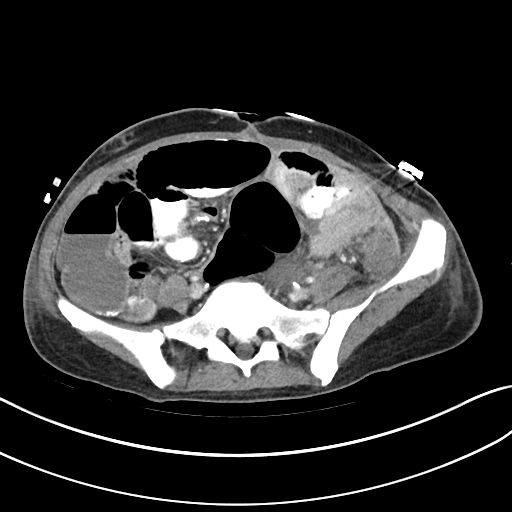
[im 42/96  soft-tissue]
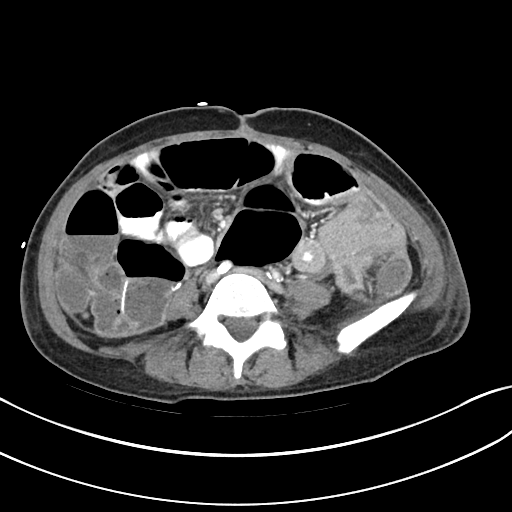
[im 48/96  soft-tissue]
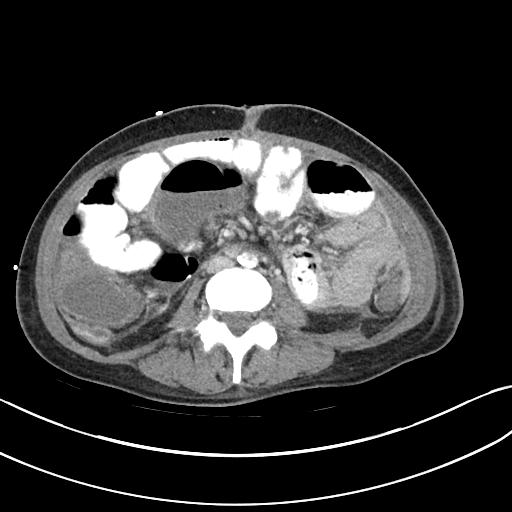
[im 54/96  soft-tissue]
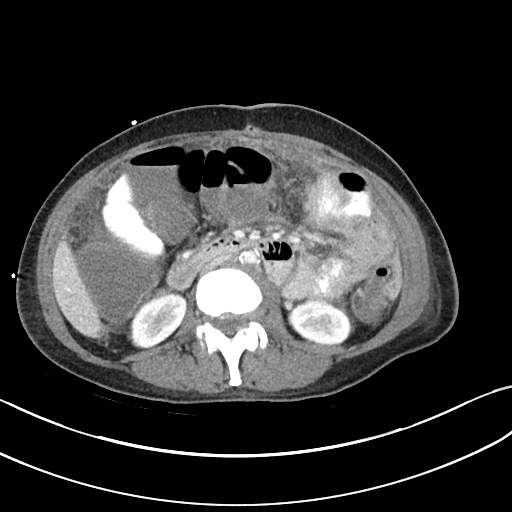
[im 60/96  soft-tissue]
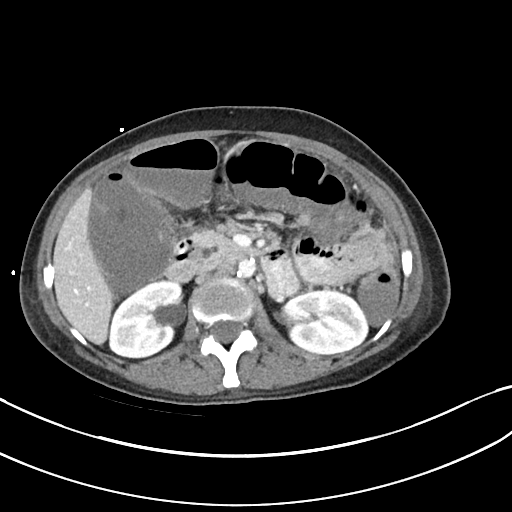
[im 60/96  bone]
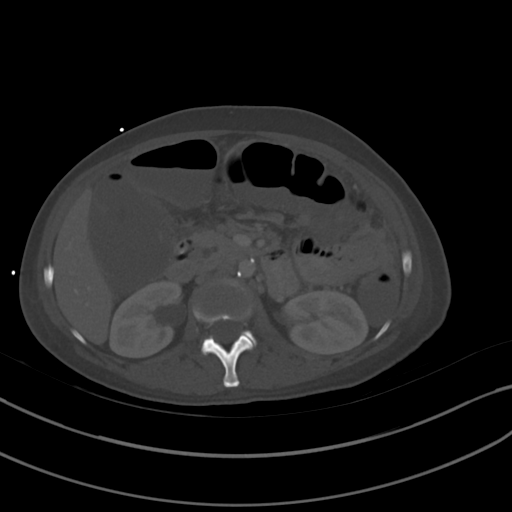
[im 66/96  soft-tissue]
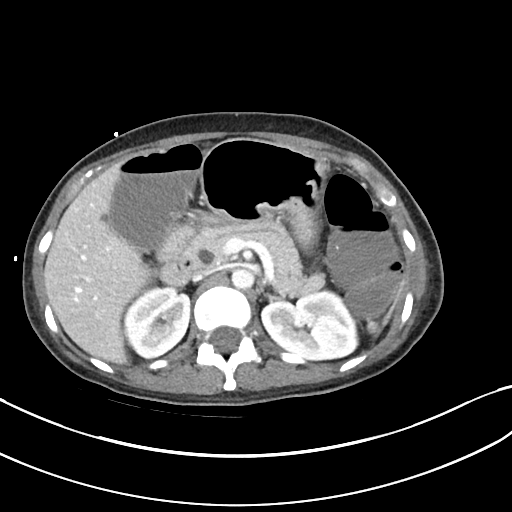
[im 78/96  soft-tissue]
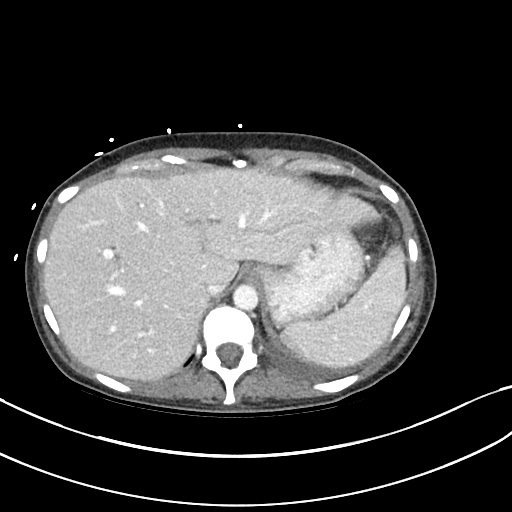
[im 84/96  soft-tissue]
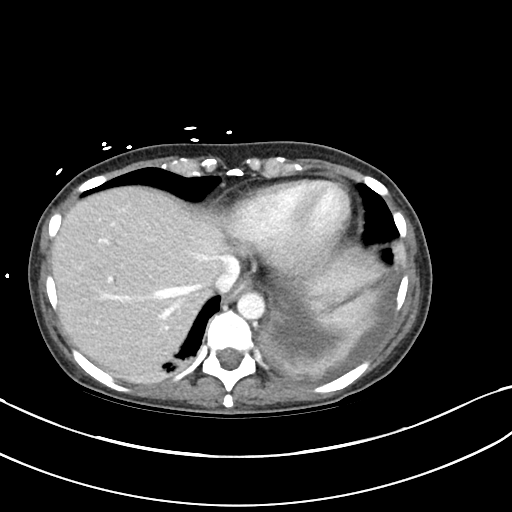
[im 90/96  soft-tissue]
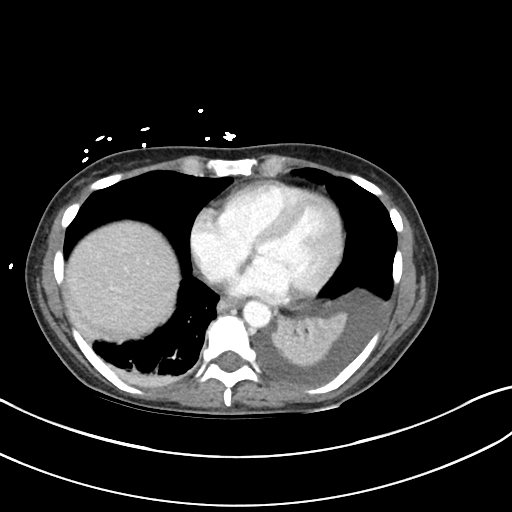

[Series 6: abdomen 3.0 mpr cor · coronal · 0.74mm/px · 3 of 78 slices shown]
[im 26/78  soft-tissue]
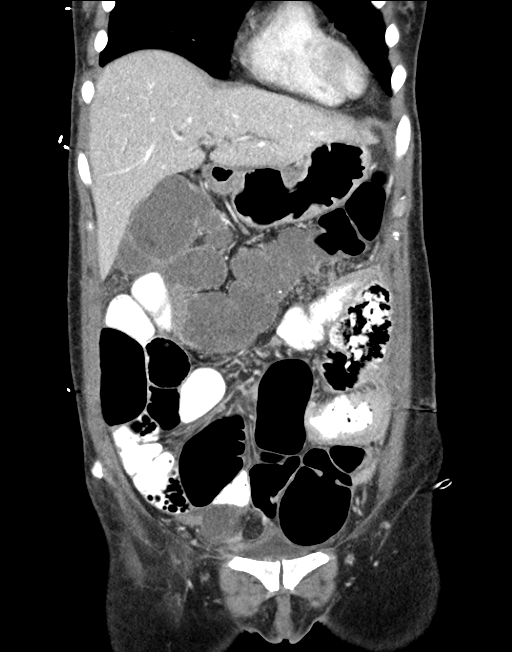
[im 35/78  soft-tissue]
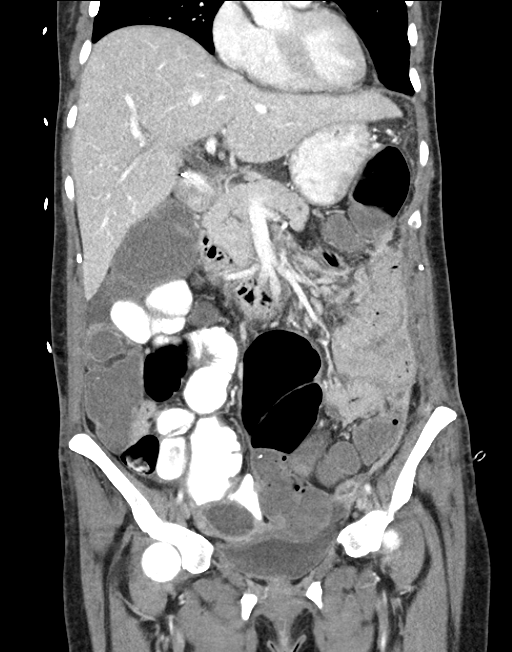
[im 43/78  soft-tissue]
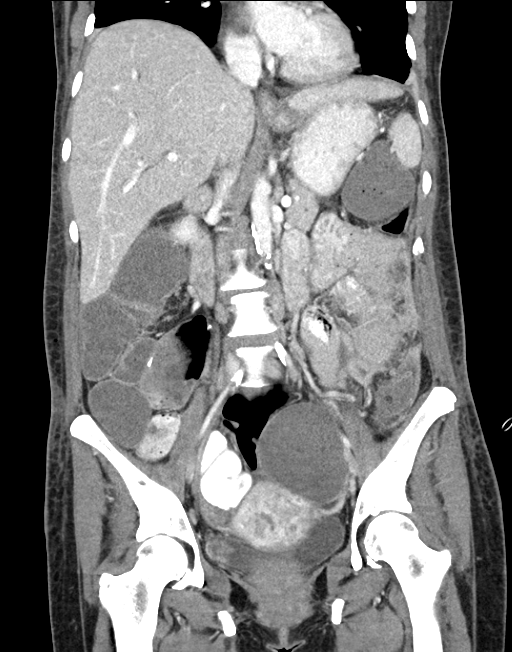

[16 of 46 positions shown; findings below may reference images not displayed]

FINDINGS: Lower chest: Small left pleural effusion. Dependent bibasilar
atelectasis. Heart is normal size.

Hepatobiliary: Prior cholecystectomy.  No focal hepatic abnormality.

Pancreas: No focal abnormality or ductal dilatation.

Spleen: No focal abnormality.  Normal size.

Adrenals/Urinary Tract: No adrenal abnormality. No focal renal
abnormality. No stones or hydronephrosis. Urinary bladder is
unremarkable.

Stomach/Bowel: Colon is mildly distended with fluid and stool. Small
bowel and stomach unremarkable. No change.

Vascular/Lymphatic: Aortic and iliac calcifications. No aneurysm or
adenopathy.

Reproductive: Heterogeneous enhancement of the uterus, possibly
related to large fibroid. 7.1 cm left adnexal cystic mass, not
significantly changed since prior study. Complex septated cystic
structure in the right adnexa measures 4.7 x 3.5 cm, not
significantly changed since prior study.

Other: No free fluid or free air. Previously seen left lateral fluid
collection has decreased in size he measures 2.7 x 0.8 cm compared
to 4.2 x 2.2 cm previously. Right paracolic gutter fluid collection
decreased, measuring 2.3 x 0.9 cm compared to 5.1 x 1.8 cm
previously.

Musculoskeletal: No acute bony abnormality.
IMPRESSION: Fluid collections in the left lateral abdomen and right paracolic
gutter have decreased in size since prior study.

Bilateral cystic adnexal masses are unchanged since prior study.

Small left pleural effusion.  Bibasilar atelectasis.

Fluid filled mildly distended colon again noted, unchanged.

## 2018-11-02 ENCOUNTER — Emergency Department (HOSPITAL_COMMUNITY): Payer: BC Managed Care – PPO

## 2018-11-02 ENCOUNTER — Emergency Department (HOSPITAL_COMMUNITY)
Admission: EM | Admit: 2018-11-02 | Discharge: 2018-11-02 | Disposition: A | Discharge status: Home / Self Care | Payer: BC Managed Care – PPO | Attending: Emergency Medicine | Admitting: Emergency Medicine

## 2018-11-02 ENCOUNTER — Encounter (HOSPITAL_COMMUNITY): Payer: Self-pay

## 2018-11-02 ENCOUNTER — Other Ambulatory Visit: Payer: Self-pay

## 2018-11-02 DIAGNOSIS — R10811 Right upper quadrant abdominal tenderness: Secondary | ICD-10-CM | POA: Diagnosis not present

## 2018-11-02 DIAGNOSIS — R197 Diarrhea, unspecified: Secondary | ICD-10-CM | POA: Diagnosis not present

## 2018-11-02 DIAGNOSIS — E860 Dehydration: Secondary | ICD-10-CM

## 2018-11-02 DIAGNOSIS — R10813 Right lower quadrant abdominal tenderness: Secondary | ICD-10-CM | POA: Diagnosis not present

## 2018-11-02 DIAGNOSIS — Z79899 Other long term (current) drug therapy: Secondary | ICD-10-CM | POA: Insufficient documentation

## 2018-11-02 DIAGNOSIS — R1033 Periumbilical pain: Secondary | ICD-10-CM | POA: Diagnosis not present

## 2018-11-02 DIAGNOSIS — Z7982 Long term (current) use of aspirin: Secondary | ICD-10-CM | POA: Insufficient documentation

## 2018-11-02 DIAGNOSIS — F17210 Nicotine dependence, cigarettes, uncomplicated: Secondary | ICD-10-CM | POA: Insufficient documentation

## 2018-11-02 LAB — COMPREHENSIVE METABOLIC PANEL
ALT: 16 U/L (ref 0–44)
AST: 16 U/L (ref 15–41)
Albumin: 4.4 g/dL (ref 3.5–5.0)
Alkaline Phosphatase: 112 U/L (ref 38–126)
Anion gap: 15 (ref 5–15)
BUN: 23 mg/dL — ABNORMAL HIGH (ref 6–20)
CO2: 17 mmol/L — ABNORMAL LOW (ref 22–32)
Calcium: 9.7 mg/dL (ref 8.9–10.3)
Chloride: 107 mmol/L (ref 98–111)
Creatinine, Ser: 0.85 mg/dL (ref 0.44–1.00)
GFR calc Af Amer: >60 mL/min (ref >60)
GFR calc non Af Amer: >60 mL/min (ref >60)
Glucose, Bld: 101 mg/dL — ABNORMAL HIGH (ref 70–99)
Potassium: 3.4 mmol/L — ABNORMAL LOW (ref 3.5–5.1)
Sodium: 139 mmol/L (ref 135–145)
Total Bilirubin: 0.6 mg/dL (ref 0.3–1.2)
Total Protein: 7.7 g/dL (ref 6.5–8.1)

## 2018-11-02 LAB — URINALYSIS, ROUTINE W REFLEX MICROSCOPIC
Bilirubin Urine: NEGATIVE
Glucose, UA: NEGATIVE mg/dL
Hgb urine dipstick: NEGATIVE
Ketones, ur: NEGATIVE mg/dL
Leukocytes,Ua: NEGATIVE
Nitrite: NEGATIVE
Protein, ur: 30 mg/dL — AB
Specific Gravity, Urine: 1.029 (ref 1.005–1.030)
pH: 5 (ref 5.0–8.0)

## 2018-11-02 LAB — CBC
HCT: 51 % — ABNORMAL HIGH (ref 36.0–46.0)
Hemoglobin: 16.4 g/dL — ABNORMAL HIGH (ref 12.0–15.0)
MCH: 29.6 pg (ref 26.0–34.0)
MCHC: 32.2 g/dL (ref 30.0–36.0)
MCV: 92.1 fL (ref 80.0–100.0)
Platelets: 389 10*3/uL (ref 150–400)
RBC: 5.54 MIL/uL — ABNORMAL HIGH (ref 3.87–5.11)
RDW: 13.8 % (ref 11.5–15.5)
WBC: 10.6 10*3/uL — ABNORMAL HIGH (ref 4.0–10.5)
nRBC: 0 % (ref 0.0–0.2)

## 2018-11-02 LAB — I-STAT BETA HCG BLOOD, ED (MC, WL, AP ONLY): I-stat hCG, quantitative: <5 m[IU]/mL (ref <5)

## 2018-11-02 LAB — LIPASE, BLOOD: Lipase: 24 U/L (ref 11–51)

## 2018-11-02 MED ORDER — LACTATED RINGERS IV BOLUS: 1000.0000 mL | Freq: Once | INTRAVENOUS | Status: DC

## 2018-11-02 MED ORDER — OXYCODONE-ACETAMINOPHEN 5-325 MG PO TABS: 2.0000 | ORAL_TABLET | Freq: Once | ORAL | Status: DC

## 2018-11-02 MED ORDER — OXYCODONE-ACETAMINOPHEN 5-325 MG PO TABS: 1.0000 | ORAL_TABLET | Freq: Once | ORAL | Status: DC

## 2018-11-02 MED ORDER — IOHEXOL 300 MG/ML  SOLN
100.0000 mL | Freq: Once | INTRAMUSCULAR | Status: AC | PRN
  Administered 2018-11-02: 100 mL via INTRAVENOUS

## 2018-11-02 MED ORDER — HYDROCODONE-ACETAMINOPHEN 5-325 MG PO TABS: 1.0000 | ORAL_TABLET | Freq: Once | ORAL | Status: DC

## 2018-11-02 MED ORDER — HYDROCODONE-ACETAMINOPHEN 5-325 MG PO TABS
2.0000 | ORAL_TABLET | Freq: Once | ORAL | Status: AC
  Administered 2018-11-02: 2 via ORAL
  Filled 2018-11-02: qty 2

## 2018-11-02 MED ORDER — ONDANSETRON HCL 4 MG/2ML IJ SOLN
4.0000 mg | Freq: Once | INTRAMUSCULAR | Status: AC
  Administered 2018-11-02: 4 mg via INTRAVENOUS
  Filled 2018-11-02: qty 2

## 2018-11-02 NOTE — ED Notes (Signed)
Sent urine culture with specimen 

## 2018-11-02 NOTE — ED Provider Notes (Addendum)
[facility] EMERGENCY DEPARTMENT Provider Note   CSN: [id number] Arrival date & time: 11/02/18  1818    History   Chief Complaint Chief Complaint  Patient presents with  . Abdominal Pain  . Diarrhea    HPI Brandi Leonard is a 53 y.o. female.     HPI   Patient presents for evaluation of the complaints.  She states that her for symptoms onset was about 4 weeks ago and consisted of watery diarrhea 3-4 times daily.  That has been persistent without changing.  Denies history of the same.  Is also over the last 3 to 4 days developed a aching "pinching" central abdominal pain that is nonradiating.  Worse with palpation.  Accompanied by nausea.  She had one episode of nonbloody nonbilious emesis 3 days ago.  History reviewed. No pertinent past medical history.  Patient Active Problem List   Diagnosis Date Noted  . Intra-abdominal abscess ([place]) 09/30/2016  . Severe protein-calorie malnutrition ([place]) 09/18/2016  . Peritonitis ([place]) 09/18/2016    History reviewed. No pertinent surgical history.   OB History   No obstetric history on file.      Home Medications    Prior to Admission medications   Medication Sig Start Date End Date Taking? Authorizing Provider  acetaminophen (TYLENOL) 325 MG tablet Take 2 tablets (650 mg total) by mouth every 4 (four) hours as needed for mild pain, fever or headache. 10/01/16   Cherene Altes, MD  amoxicillin-clavulanate (AUGMENTIN) 875-125 MG tablet Take 1 tablet by mouth every 12 (twelve) hours. 10/01/16   Cherene Altes, MD  aspirin EC 81 MG EC tablet Take 1 tablet (81 mg total) by mouth daily. 10/01/16   Cherene Altes, MD  clonazePAM (KLONOPIN) 0.5 MG tablet Take 1 tablet (0.5 mg total) by mouth 2 (two) times daily. 10/01/16   Cherene Altes, MD  docusate sodium (COLACE) 100 MG capsule Take 1 capsule (100 mg total) by mouth 2 (two) times daily. 10/01/16   Cherene Altes, MD  ibuprofen (ADVIL,MOTRIN) 800 MG  tablet Take 800 mg by mouth 3 (three) times daily as needed for cramping. 06/15/16   [provider]  oxyCODONE (OXY IR/ROXICODONE) 5 MG immediate release tablet Take 1-2 tablets (5-10 mg total) by mouth every 4 (four) hours as needed for severe pain. 10/01/16   Cherene Altes, MD  propranolol ER (INDERAL LA) 60 MG 24 hr capsule Take 1 capsule (60 mg total) by mouth daily. 10/01/16   Cherene Altes, MD    Family History History reviewed. No pertinent family history.  Social History Social History   Tobacco Use  . Smoking status: Current Every Day Smoker    Packs/day: 1.00    Years: 33.00    Pack years: 33.00    Types: Cigarettes    Start date: 09/18/1985  . Smokeless tobacco: Never Used  Substance Use Topics  . Alcohol use: Not on file  . Drug use: Not on file     Allergies   Penicillins   Review of Systems Review of Systems  Constitutional: Negative for chills and fever.  HENT: Negative for sore throat.   Eyes: Negative for visual disturbance.  Respiratory: Negative for cough.   Cardiovascular: Negative for chest pain.  Gastrointestinal: Positive for abdominal pain, diarrhea, nausea and vomiting.  Genitourinary: Negative for dysuria.  Musculoskeletal: Positive for back pain.       Bilateral achy low paraspinal pain  Skin: Negative for rash.  Neurological: Negative for syncope.  Psychiatric/Behavioral: Negative for dysphoric mood.     Physical Exam Updated Vital Signs BP (!) 132/91   Pulse 92   Temp 98 F (36.7 C) (Oral)   Resp 18   Ht 5\' 3"  (1.6 m)   Wt 57.2 kg   SpO2 96%   BMI 22.32 kg/m   Physical Exam Vitals signs and nursing note reviewed.  Constitutional:      General: She is not in acute distress.    Appearance: She is well-developed.  HENT:     Head: Normocephalic and atraumatic.  Eyes:     Conjunctiva/sclera: Conjunctivae normal.  Neck:     Musculoskeletal: Neck supple.  Cardiovascular:     Rate and Rhythm: Normal rate and  regular rhythm.     Heart sounds: No murmur.  Pulmonary:     Effort: Pulmonary effort is normal. No respiratory distress.     Breath sounds: Normal breath sounds.  Abdominal:     General: A surgical scar is present.     Palpations: Abdomen is soft.     Tenderness: There is abdominal tenderness.     Comments: Tympany of the right upper and lower quadrants, periumbilical tenderness palpation without guarding or rebound  Skin:    General: Skin is warm and dry.  Neurological:     Mental Status: She is alert and oriented to person, place, and time.      ED Treatments / Results  Labs (all labs ordered are listed, but only abnormal results are displayed) Labs Reviewed  COMPREHENSIVE METABOLIC PANEL - Abnormal; Notable for the following components:      Result Value   Potassium 3.4 (*)    CO2 17 (*)    Glucose, Bld 101 (*)    BUN 23 (*)    All other components within normal limits  CBC - Abnormal; Notable for the following components:   WBC 10.6 (*)    RBC 5.54 (*)    Hemoglobin 16.4 (*)    HCT 51.0 (*)    All other components within normal limits  URINALYSIS, ROUTINE W REFLEX MICROSCOPIC - Abnormal; Notable for the following components:   Color, Urine AMBER (*)    APPearance HAZY (*)    Protein, ur 30 (*)    Bacteria, UA RARE (*)    All other components within normal limits  LIPASE, BLOOD  I-STAT BETA HCG BLOOD, ED ([facility], [facility], [facility] ONLY)    EKG EKG Interpretation  Date/Time:  Thursday November 02 2018 18:29:03 EDT Ventricular Rate:  127 PR Interval:    QRS Duration: 70 QT Interval:  388 QTC Calculation: 563 R Axis:   67 Text Interpretation:  Long QTc  Radiology Ct Abdomen Pelvis W Contrast  Result Date: 11/02/2018 CLINICAL DATA:  Diarrhea for several weeks with abdominal pain EXAM: CT ABDOMEN AND PELVIS WITH CONTRAST TECHNIQUE: Multidetector CT imaging of the abdomen and pelvis was performed using the standard protocol following bolus administration of intravenous  contrast. CONTRAST:  186mL OMNIPAQUE IOHEXOL 300 MG/ML  SOLN COMPARISON:  09/29/2016 FINDINGS: Lower chest: No acute abnormality. Hepatobiliary: No focal liver abnormality is seen. Status post cholecystectomy. No biliary dilatation. Pancreas: Unremarkable. No pancreatic ductal dilatation or surrounding inflammatory changes. Spleen: Normal in size without focal abnormality. Adrenals/Urinary Tract: Adrenal glands are within normal limits. Kidneys are well visualized bilaterally without renal calculi or urinary tract obstructive changes. The bladder decompressed. Stomach/Bowel: Colon is diffusely fluid filled consistent with the diarrheal state. No obstructive changes are  seen. Postsurgical changes consistent with prior appendectomy are noted. Postsurgical changes are noted in the small bowel. No obstructive changes are seen. Vascular/Lymphatic: Aortic atherosclerosis. No enlarged abdominal or pelvic lymph nodes. Reproductive: Uterine fibroid is again identified stable. Stable left adnexal cyst is noted with slight increased fluid density which may be related underlying hemorrhage. Previously seen complex lesion within the right adnexa is again seen and appears smaller in size now measuring approximately 3.4 cm in greatest dimension. Adjacent to this however, there is a somewhat amorphous cystic fluid collection which extends adjacent to the cystic lesion. This measures approximately 6.5 cm in transverse dimension and approximately 11 cm in AP dimension. It extends in a craniocaudad measurement for approximately 12 cm. It is possible this represents significant increased cystic component from the previously seen adnexal complex cystic lesion. Alternatively this may be related to rupture with loculation. Other: Minimal free fluid is noted.  No hernia is seen. Musculoskeletal: Degenerative changes of the lumbar spine are noted. IMPRESSION: Diffusely fluid-filled colon consistent with the diarrheal state. There have been  changes in the left adnexal cystic lesion with slight increased attenuation of the fluid within the otherwise stable cyst. This may be related to a proteinaceous component or possible hemorrhage. The previously seen complex right adnexal cystic lesion has reduced in size but now shows a adjacent fluid collection which may be related to the adnexal cystic lesion but could be related to prior rupture with loculation. Ultrasound examination may be helpful for delineation. Electronically Signed   By: Inez Catalina M.D.   On: 11/02/2018 23:22    Procedures Procedures (including critical care time)  Medications Ordered in ED Medications  iohexol (OMNIPAQUE) 300 MG/ML solution 100 mL (100 mLs Intravenous Contrast Given 11/02/18 2229)  ondansetron (ZOFRAN) injection 4 mg (4 mg Intravenous Given 11/02/18 2341)  HYDROcodone-acetaminophen (NORCO/VICODIN) 5-325 MG per tablet 2 tablet (2 tablets Oral Given 11/02/18 2343)     Initial Impression / Assessment and Plan / ED Course  I have reviewed the triage vital signs and the nursing notes.  Pertinent labs & imaging results that were available during my care of the patient were reviewed by me and considered in my medical decision making (see chart for details).        Ms. Ola is a 53 year old female with a history of previous abdominal surgeries with multiple small bowel obstructions since.  I have reviewed her medical records.  She has had abdominal abscesses in the past, including around this time last year.  Reports she has been well since then.  Here with diarrhea of unknown cause.  Abdominal tenderness with tympany was concerning for recurrence of obstruction.  CT was ordered which ultimately showed signs of diarrhea but no other concerning signs for infection or obstruction.  Labs showed decreased bicarb and elevated BUN, likely due to her diarrhea.  Mild leukocytosis with elevated hemoglobin, again likely due to some dehydration.  She does not have an  AKI.  UA not suggestive of infection.  Overall her exam and work-up are reassuring.  She is okay to follow-up with her PCP regarding the cause of her diarrhea.  She was treated with Zofran, p.o. fluids, and Norco here.  She had tachycardia on arrival but this improved with symptomatic treatment and observation.  There is no indication for further emergent evaluation.  Patient vocalized understanding and agreement with plan. Hand no other questions or concerns. Was given relevant verbal and written information regarding aftercare and return precautions and discharged  in good condition.    Final Clinical Impressions(s) / ED Diagnoses   Final diagnoses:  Diarrhea, unspecified type  Periumbilical abdominal pain  Dehydration    ED Discharge Orders    None       Tillie Fantasia, MD 11/03/18 [id number]    Tillie Fantasia, MD 11/03/18 [place], [place], DO 11/03/18 1900

## 2018-11-02 NOTE — ED Triage Notes (Signed)
Pt arrives POV for eval of diarrhea x 4 weeks, weakness and centralized abd pain onset this week. Reports nausea, 2 episodes of emesis. Denies hematemesis or melena. Denies hx of same

## 2019-12-28 ENCOUNTER — Other Ambulatory Visit: Payer: Self-pay | Admitting: Internal Medicine

## 2019-12-28 DIAGNOSIS — M5412 Radiculopathy, cervical region: Secondary | ICD-10-CM

## 2020-01-22 ENCOUNTER — Ambulatory Visit
Admission: RE | Admit: 2020-01-22 | Discharge: 2020-01-22 | Disposition: A | Discharge status: Home / Self Care | Payer: BC Managed Care – PPO | Source: Ambulatory Visit | Attending: Internal Medicine | Admitting: Internal Medicine

## 2020-01-22 ENCOUNTER — Other Ambulatory Visit: Payer: Self-pay

## 2020-01-22 DIAGNOSIS — M5412 Radiculopathy, cervical region: Secondary | ICD-10-CM

## 2021-03-02 ENCOUNTER — Other Ambulatory Visit: Payer: Self-pay | Admitting: Internal Medicine

## 2021-03-02 DIAGNOSIS — R519 Headache, unspecified: Secondary | ICD-10-CM

## 2021-03-25 ENCOUNTER — Inpatient Hospital Stay: Admission: RE | Admit: 2021-03-25 | Payer: BC Managed Care – PPO | Source: Ambulatory Visit

## 2021-04-08 ENCOUNTER — Other Ambulatory Visit: Payer: Self-pay | Admitting: Internal Medicine

## 2021-04-08 DIAGNOSIS — R519 Headache, unspecified: Secondary | ICD-10-CM

## 2021-04-10 ENCOUNTER — Ambulatory Visit
Admission: RE | Admit: 2021-04-10 | Discharge: 2021-04-10 | Disposition: A | Discharge status: Home / Self Care | Payer: BC Managed Care – PPO | Source: Ambulatory Visit | Attending: Internal Medicine | Admitting: Internal Medicine

## 2021-04-10 DIAGNOSIS — R519 Headache, unspecified: Secondary | ICD-10-CM

## 2021-04-20 ENCOUNTER — Other Ambulatory Visit: Payer: BC Managed Care – PPO

## 2022-04-20 ENCOUNTER — Ambulatory Visit
Admission: RE | Admit: 2022-04-20 | Discharge: 2022-04-20 | Disposition: A | Discharge status: Home / Self Care | Payer: BC Managed Care – PPO | Source: Ambulatory Visit | Attending: Internal Medicine | Admitting: Internal Medicine

## 2022-04-20 ENCOUNTER — Other Ambulatory Visit: Payer: Self-pay | Admitting: Internal Medicine

## 2022-04-20 DIAGNOSIS — R109 Unspecified abdominal pain: Secondary | ICD-10-CM

## 2022-04-20 MED ORDER — IOPAMIDOL (ISOVUE-300) INJECTION 61%
100.0000 mL | Freq: Once | INTRAVENOUS | Status: AC | PRN
  Administered 2022-04-20: 100 mL via INTRAVENOUS

## 2022-07-20 DIAGNOSIS — K635 Polyp of colon: Secondary | ICD-10-CM

## 2022-07-20 DIAGNOSIS — R1310 Dysphagia, unspecified: Secondary | ICD-10-CM

## 2022-11-02 DIAGNOSIS — R519 Headache, unspecified: Secondary | ICD-10-CM | POA: Diagnosis not present

## 2022-11-02 DIAGNOSIS — F411 Generalized anxiety disorder: Secondary | ICD-10-CM | POA: Diagnosis not present

## 2022-11-02 DIAGNOSIS — F909 Attention-deficit hyperactivity disorder, unspecified type: Secondary | ICD-10-CM | POA: Diagnosis not present

## 2022-11-02 DIAGNOSIS — K580 Irritable bowel syndrome with diarrhea: Secondary | ICD-10-CM | POA: Diagnosis not present

## 2022-11-02 DIAGNOSIS — F330 Major depressive disorder, recurrent, mild: Secondary | ICD-10-CM | POA: Diagnosis not present

## 2022-11-02 DIAGNOSIS — G894 Chronic pain syndrome: Secondary | ICD-10-CM | POA: Diagnosis not present

## 2022-11-30 DIAGNOSIS — R519 Headache, unspecified: Secondary | ICD-10-CM | POA: Diagnosis not present

## 2022-11-30 DIAGNOSIS — R634 Abnormal weight loss: Secondary | ICD-10-CM | POA: Diagnosis not present

## 2022-11-30 DIAGNOSIS — K580 Irritable bowel syndrome with diarrhea: Secondary | ICD-10-CM | POA: Diagnosis not present

## 2022-11-30 DIAGNOSIS — G894 Chronic pain syndrome: Secondary | ICD-10-CM | POA: Diagnosis not present

## 2022-11-30 DIAGNOSIS — F330 Major depressive disorder, recurrent, mild: Secondary | ICD-10-CM | POA: Diagnosis not present

## 2022-11-30 DIAGNOSIS — F411 Generalized anxiety disorder: Secondary | ICD-10-CM | POA: Diagnosis not present

## 2022-11-30 DIAGNOSIS — F909 Attention-deficit hyperactivity disorder, unspecified type: Secondary | ICD-10-CM | POA: Diagnosis not present
